# Patient Record
Sex: Female | Born: 1965
Health system: Southern US, Community
[De-identification: ages and names within clinical notes are randomized; demographics above are authoritative.]

## PROBLEM LIST (undated history)

## (undated) DIAGNOSIS — F419 Anxiety disorder, unspecified: Secondary | ICD-10-CM

## (undated) DIAGNOSIS — F319 Bipolar disorder, unspecified: Secondary | ICD-10-CM

## (undated) DIAGNOSIS — I10 Essential (primary) hypertension: Secondary | ICD-10-CM

## (undated) DIAGNOSIS — F32A Depression, unspecified: Secondary | ICD-10-CM

## (undated) DIAGNOSIS — F329 Major depressive disorder, single episode, unspecified: Secondary | ICD-10-CM

## (undated) DIAGNOSIS — K219 Gastro-esophageal reflux disease without esophagitis: Secondary | ICD-10-CM

## (undated) HISTORY — PX: DILATION AND CURETTAGE OF UTERUS: SHX78

---

## 2006-07-12 ENCOUNTER — Emergency Department (HOSPITAL_COMMUNITY): Admission: EM | Admit: 2006-07-12 | Discharge: 2006-07-12 | Payer: Self-pay | Admitting: Emergency Medicine

## 2006-10-17 ENCOUNTER — Emergency Department (HOSPITAL_COMMUNITY): Admission: EM | Admit: 2006-10-17 | Discharge: 2006-10-18 | Payer: Self-pay | Admitting: Emergency Medicine

## 2009-04-21 ENCOUNTER — Emergency Department (HOSPITAL_COMMUNITY): Admission: EM | Admit: 2009-04-21 | Discharge: 2009-04-21 | Payer: Self-pay | Admitting: Emergency Medicine

## 2010-09-29 LAB — URINALYSIS, ROUTINE W REFLEX MICROSCOPIC
Glucose, UA: NEGATIVE mg/dL
Nitrite: NEGATIVE
Protein, ur: NEGATIVE mg/dL
Specific Gravity, Urine: 1.012 (ref 1.005–1.030)
Urobilinogen, UA: 0.2 mg/dL (ref 0.0–1.0)

## 2010-09-29 LAB — URINE MICROSCOPIC-ADD ON

## 2010-09-29 LAB — POCT PREGNANCY, URINE: Preg Test, Ur: NEGATIVE

## 2010-10-18 ENCOUNTER — Emergency Department (HOSPITAL_COMMUNITY)
Admission: EM | Admit: 2010-10-18 | Discharge: 2010-10-19 | Disposition: A | Discharge status: Home / Self Care | Payer: Self-pay | Attending: Emergency Medicine | Admitting: Emergency Medicine

## 2010-10-18 DIAGNOSIS — F411 Generalized anxiety disorder: Secondary | ICD-10-CM | POA: Insufficient documentation

## 2010-10-18 DIAGNOSIS — R112 Nausea with vomiting, unspecified: Secondary | ICD-10-CM | POA: Insufficient documentation

## 2010-10-18 DIAGNOSIS — H53149 Visual discomfort, unspecified: Secondary | ICD-10-CM | POA: Insufficient documentation

## 2010-10-18 DIAGNOSIS — G43909 Migraine, unspecified, not intractable, without status migrainosus: Secondary | ICD-10-CM | POA: Insufficient documentation

## 2010-10-18 DIAGNOSIS — Z79899 Other long term (current) drug therapy: Secondary | ICD-10-CM | POA: Insufficient documentation

## 2010-12-05 IMAGING — CT CT HEAD W/O CM
1 of 2 series · 13 of 30 positions shown, 17 images · non-contrast
Comparison: None

CLINICAL DATA: Headaches since yesterday, nausea, vomiting,
photosensitivity, history of migraines

CT HEAD WITHOUT CONTRAST
TECHNIQUE: Contiguous axial images were obtained from the base of
the skull through the vertex without contrast.

[Series 2: brain · axial · 0.47mm/px · z∈[+154,+283]mm · 13 of 28 slices shown, 17 images]
[im 2/28  brain]
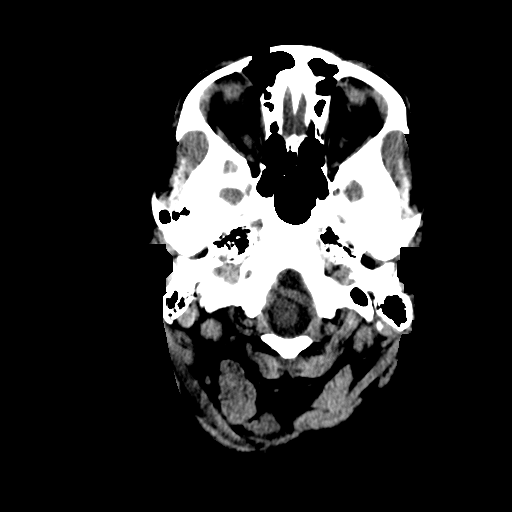
[im 2/28  bone]
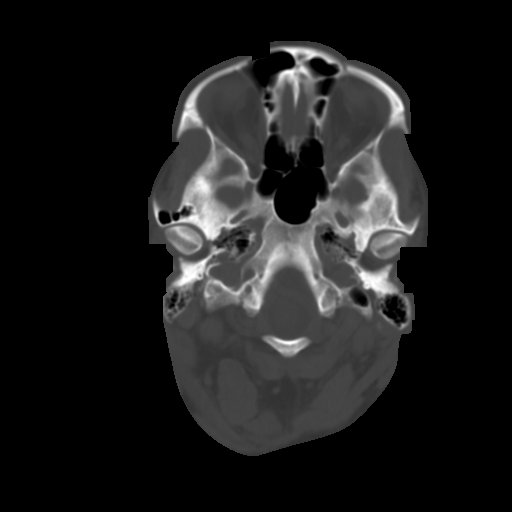
[im 4/28  brain]
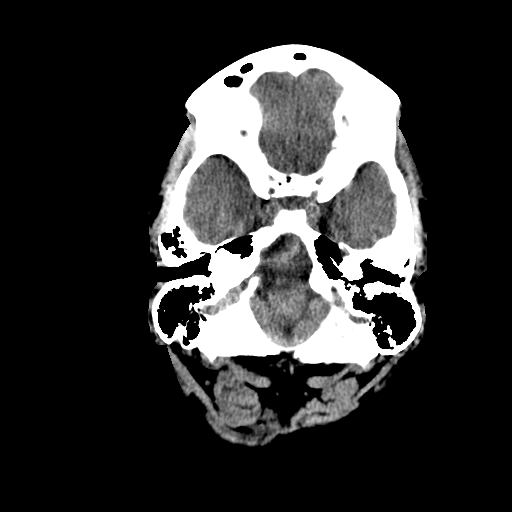
[im 6/28  brain]
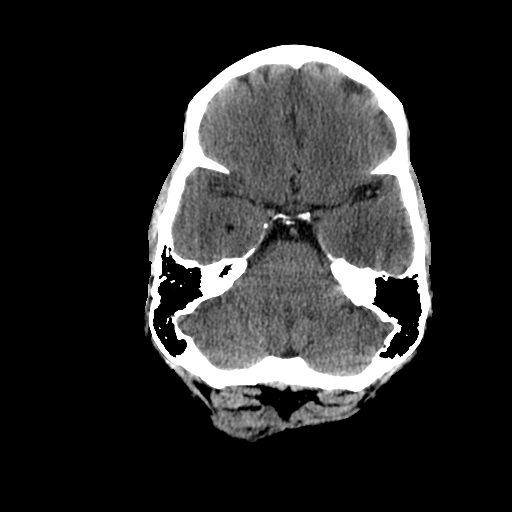
[im 8/28  brain]
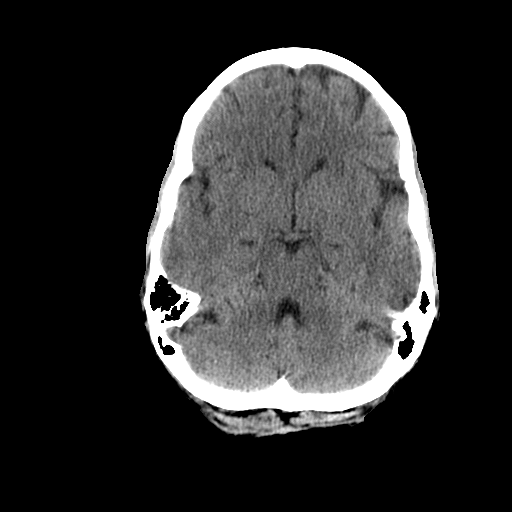
[im 10/28  brain]
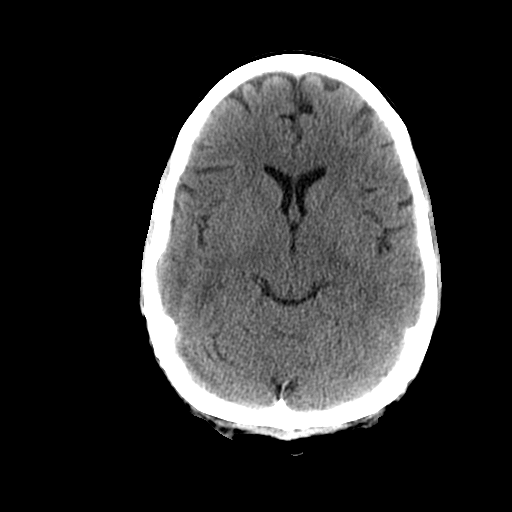
[im 10/28  bone]
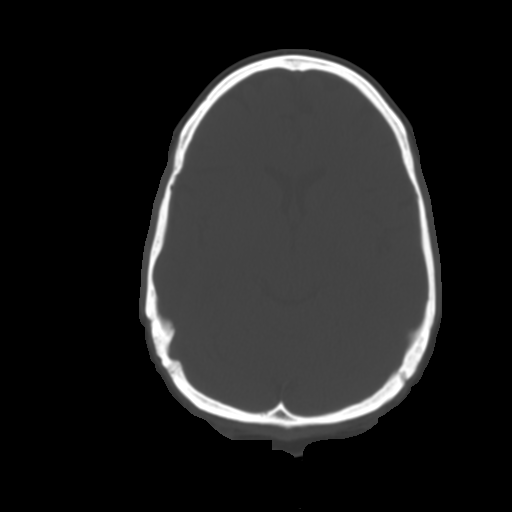
[im 12/28  brain]
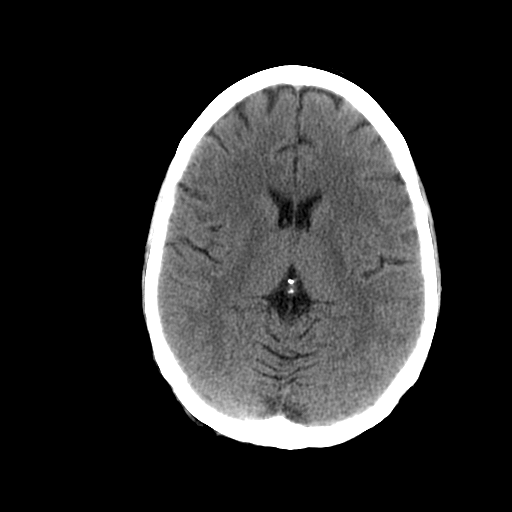
[im 14/28  brain]
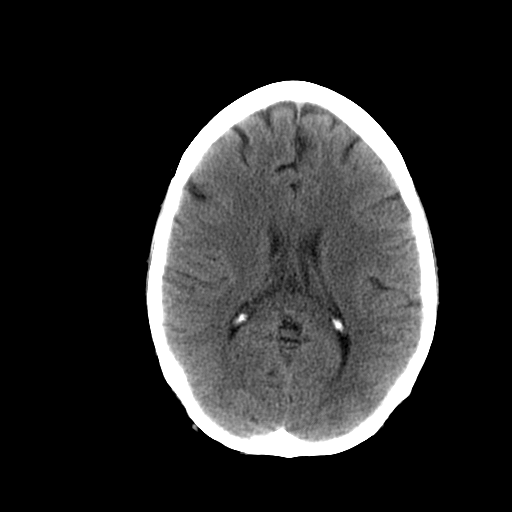
[im 16/28  brain]
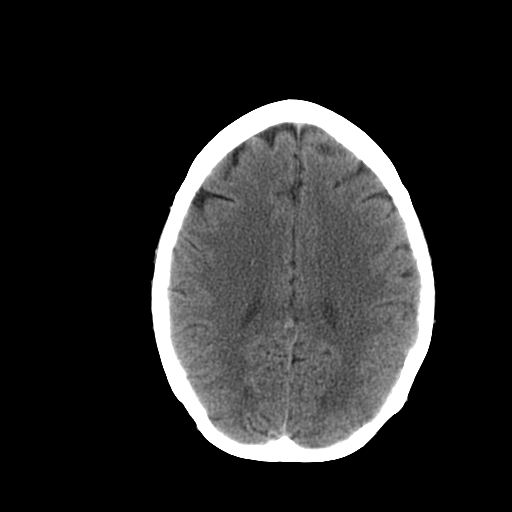
[im 18/28  brain]
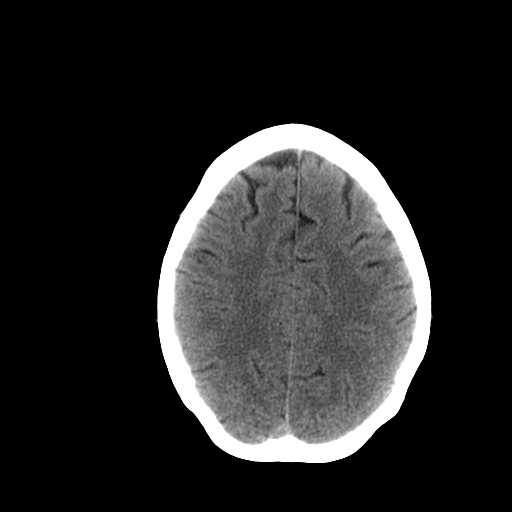
[im 18/28  bone]
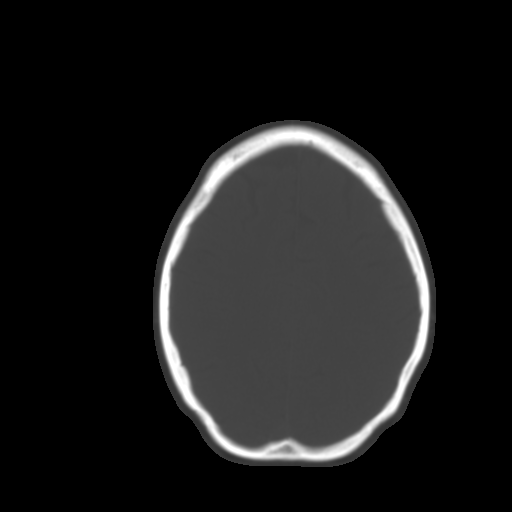
[im 20/28  brain]
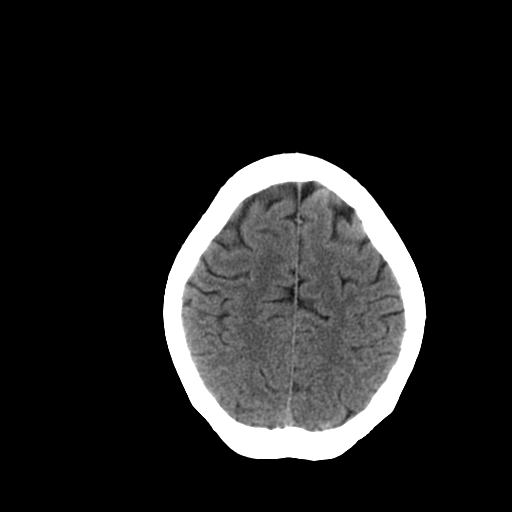
[im 22/28  brain]
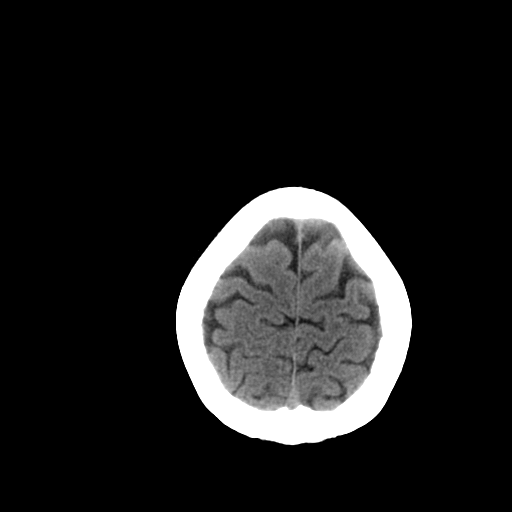
[im 24/28  brain]
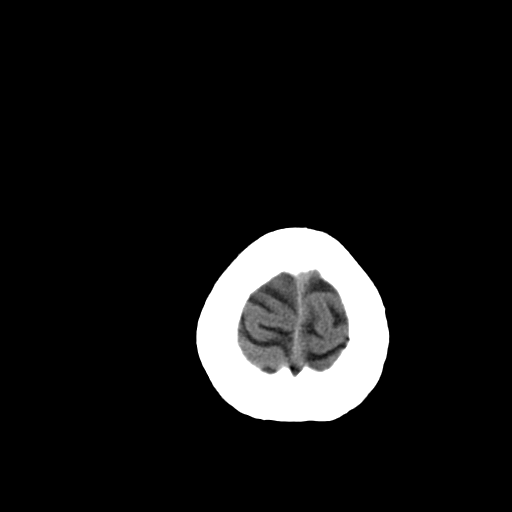
[im 26/28  brain]
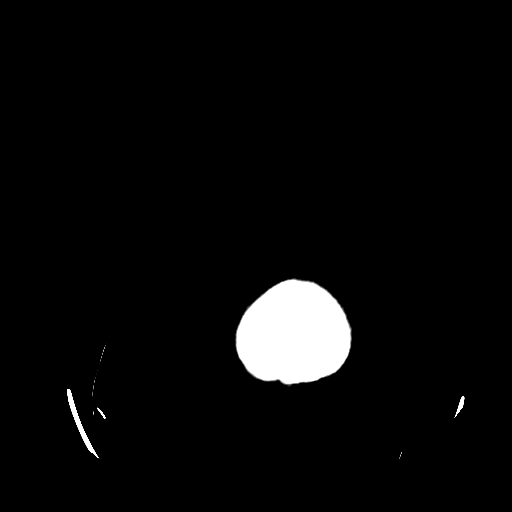
[im 26/28  bone]
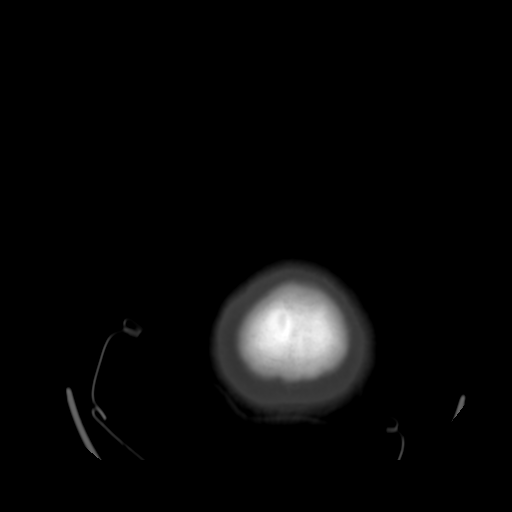

[13 of 30 positions shown; findings below may reference images not displayed]

FINDINGS: The ventricular system is normal in size and
configuration, and the septum is in a normal midline position.  The
fourth ventricle and basilar cisterns appear normal.  No
hemorrhage, mass lesion, or acute infarction is seen.  On bone
window images no bony abnormality is noted.  The paranasal sinuses
that are visualized are clear.
IMPRESSION: No acute intracranial abnormality.

## 2010-12-26 ENCOUNTER — Emergency Department: Payer: Self-pay | Admitting: Unknown Physician Specialty

## 2011-01-03 ENCOUNTER — Emergency Department: Payer: Self-pay | Admitting: Internal Medicine

## 2011-02-07 ENCOUNTER — Ambulatory Visit: Payer: Self-pay | Admitting: Gastroenterology

## 2011-07-13 ENCOUNTER — Emergency Department (HOSPITAL_COMMUNITY): Payer: Self-pay

## 2011-07-13 ENCOUNTER — Emergency Department (HOSPITAL_COMMUNITY)
Admission: EM | Admit: 2011-07-13 | Discharge: 2011-07-13 | Disposition: A | Discharge status: Home / Self Care | Payer: Self-pay | Attending: Emergency Medicine | Admitting: Emergency Medicine

## 2011-07-13 ENCOUNTER — Encounter (HOSPITAL_COMMUNITY): Payer: Self-pay | Admitting: Emergency Medicine

## 2011-07-13 DIAGNOSIS — F3130 Bipolar disorder, current episode depressed, mild or moderate severity, unspecified: Secondary | ICD-10-CM | POA: Insufficient documentation

## 2011-07-13 DIAGNOSIS — K921 Melena: Secondary | ICD-10-CM | POA: Insufficient documentation

## 2011-07-13 DIAGNOSIS — I10 Essential (primary) hypertension: Secondary | ICD-10-CM | POA: Insufficient documentation

## 2011-07-13 DIAGNOSIS — R1032 Left lower quadrant pain: Secondary | ICD-10-CM | POA: Insufficient documentation

## 2011-07-13 DIAGNOSIS — N390 Urinary tract infection, site not specified: Secondary | ICD-10-CM | POA: Insufficient documentation

## 2011-07-13 DIAGNOSIS — R10819 Abdominal tenderness, unspecified site: Secondary | ICD-10-CM | POA: Insufficient documentation

## 2011-07-13 HISTORY — DX: Essential (primary) hypertension: I10

## 2011-07-13 HISTORY — DX: Depression, unspecified: F32.A

## 2011-07-13 HISTORY — DX: Bipolar disorder, unspecified: F31.9

## 2011-07-13 HISTORY — DX: Major depressive disorder, single episode, unspecified: F32.9

## 2011-07-13 LAB — DIFFERENTIAL
Basophils Relative: 1 % (ref 0–1)
Eosinophils Relative: 2 % (ref 0–5)
Lymphocytes Relative: 35 % (ref 12–46)
Lymphs Abs: 3.1 10*3/uL (ref 0.7–4.0)
Neutrophils Relative %: 54 % (ref 43–77)

## 2011-07-13 LAB — CBC
HCT: 32.5 % — ABNORMAL LOW (ref 36.0–46.0)
MCH: 29.8 pg (ref 26.0–34.0)
MCV: 86.4 fL (ref 78.0–100.0)
RBC: 3.76 MIL/uL — ABNORMAL LOW (ref 3.87–5.11)
RDW: 12.5 % (ref 11.5–15.5)
WBC: 8.9 10*3/uL (ref 4.0–10.5)

## 2011-07-13 LAB — COMPREHENSIVE METABOLIC PANEL
ALT: 13 U/L (ref 0–35)
AST: 14 U/L (ref 0–37)
BUN: 12 mg/dL (ref 6–23)
Creatinine, Ser: 0.78 mg/dL (ref 0.50–1.10)
GFR calc non Af Amer: >90 mL/min (ref >90)
Total Protein: 6.6 g/dL (ref 6.0–8.3)

## 2011-07-13 LAB — URINALYSIS, ROUTINE W REFLEX MICROSCOPIC
Glucose, UA: NEGATIVE mg/dL
Ketones, ur: NEGATIVE mg/dL
Protein, ur: NEGATIVE mg/dL
Urobilinogen, UA: 0.2 mg/dL (ref 0.0–1.0)
pH: 6 (ref 5.0–8.0)

## 2011-07-13 LAB — URINE MICROSCOPIC-ADD ON

## 2011-07-13 MED ORDER — IOHEXOL 300 MG/ML  SOLN: 20.0000 mL | INTRAMUSCULAR | Status: AC

## 2011-07-13 MED ORDER — CIPROFLOXACIN HCL 500 MG PO TABS: 500.0000 mg | ORAL_TABLET | Freq: Two times a day (BID) | ORAL | Status: DC

## 2011-07-13 MED ORDER — SODIUM CHLORIDE 0.9 % IV SOLN
Freq: Once | INTRAVENOUS | Status: AC
  Administered 2011-07-13: 1000 mL via INTRAVENOUS

## 2011-07-13 MED ORDER — CIPROFLOXACIN HCL 500 MG PO TABS: 500.0000 mg | ORAL_TABLET | Freq: Two times a day (BID) | ORAL | Status: AC

## 2011-07-13 MED ORDER — MORPHINE SULFATE 4 MG/ML IJ SOLN
4.0000 mg | Freq: Once | INTRAMUSCULAR | Status: AC
  Administered 2011-07-13: 4 mg via INTRAVENOUS
  Filled 2011-07-13: qty 1

## 2011-07-13 MED ORDER — IOHEXOL 300 MG/ML  SOLN
80.0000 mL | Freq: Once | INTRAMUSCULAR | Status: AC | PRN
  Administered 2011-07-13: 80 mL via INTRAVENOUS

## 2011-07-13 MED ORDER — HYDROCODONE-ACETAMINOPHEN 5-500 MG PO TABS: 1.0000 | ORAL_TABLET | Freq: Four times a day (QID) | ORAL | Status: AC | PRN

## 2011-07-13 MED ORDER — ONDANSETRON HCL 4 MG/2ML IJ SOLN
4.0000 mg | Freq: Once | INTRAMUSCULAR | Status: AC
  Administered 2011-07-13: 4 mg via INTRAVENOUS
  Filled 2011-07-13: qty 2

## 2011-07-13 NOTE — ED Notes (Signed)
Pt has finished drinking CT contrast (water).  CT notified.

## 2011-07-13 NOTE — ED Notes (Signed)
PT. REPORTS MIGRAINE HEADACHE ONSET LAST NIGHT UNRELIEVED BY PRESCRIPTION MEDICATION , ALSO REPORTS LLQ PAIN FOR 2 MONTHS AND PRODUCTIVE COUGH AND NASAL CONGESTION WITH BLOODY STOOLS ONSET LAST WEEK .

## 2011-07-13 NOTE — ED Provider Notes (Signed)
History     CSN: [id number]  Arrival date & time 07/13/11  4098   First MD Initiated Contact with Patient 07/13/11 [phone]      Chief Complaint  Patient presents with  . Migraine    (Consider location/radiation/quality/duration/timing/severity/associated sxs/prior treatment) HPI Comments: States for two months has been having pain in the llq.  Becoming worse.  She also reports having several episodes of blood-streaked stool.  She denies fevers or chills.  Has been treated for utis in the past few months.    Patient is a 46 y.o. female presenting with migraine. The history is provided by the patient.  Migraine This is a new problem. Episode onset: 2 months ago. The problem occurs constantly. The problem has been gradually worsening. Associated symptoms include abdominal pain. The symptoms are aggravated by nothing. The symptoms are relieved by nothing. She has tried nothing for the symptoms.    Past Medical History  Diagnosis Date  . Migraine   . Hypertension   . Depression   . Bipolar 1 disorder     History reviewed. No pertinent past surgical history.  No family history on file.  History  Substance Use Topics  . Smoking status: Never Smoker   . Smokeless tobacco: Not on file  . Alcohol Use: No    OB History    Grav Para Term Preterm Abortions TAB SAB Ect Mult Living                  Review of Systems  Gastrointestinal: Positive for abdominal pain.  All other systems reviewed and are negative.    Allergies  Review of patient's allergies indicates no known allergies.  Home Medications  No current outpatient prescriptions on file.  BP 137/88  Pulse 85  Temp(Src) 98.7 F (37.1 C) (Oral)  Resp 14  SpO2 97%  Physical Exam  Nursing note and vitals reviewed. Constitutional: She appears well-developed and well-nourished.  HENT:  Head: Normocephalic and atraumatic.  Neck: Normal range of motion.  Abdominal: Soft. Bowel sounds are normal. She exhibits no  distension. There is tenderness.  Musculoskeletal: Normal range of motion. She exhibits no edema.  Neurological: She is alert.  Skin: Skin is warm and dry.    ED Course  Procedures (including critical care time)   Labs Reviewed  CBC  DIFFERENTIAL  COMPREHENSIVE METABOLIC PANEL  LIPASE, BLOOD  URINALYSIS, ROUTINE W REFLEX MICROSCOPIC   No results found.   No diagnosis found.    MDM  Labs and ct are okay with the exception of uti.  Will treat with pain meds, antibiotics.  To return as needed if worsens.        Geoffery Lyons, MD 07/13/11 [id number]

## 2011-07-13 NOTE — ED Notes (Signed)
Pt drinking water contrast at this time.  

## 2011-07-13 NOTE — ED Notes (Signed)
Dr. Judd Lien at pt bedside.

## 2011-08-10 ENCOUNTER — Inpatient Hospital Stay (HOSPITAL_COMMUNITY): Payer: Self-pay

## 2011-08-10 ENCOUNTER — Inpatient Hospital Stay (HOSPITAL_COMMUNITY)
Admission: AD | Admit: 2011-08-10 | Discharge: 2011-08-10 | Disposition: A | Discharge status: Home / Self Care | Payer: Self-pay | Source: Ambulatory Visit | Attending: Obstetrics & Gynecology | Admitting: Obstetrics & Gynecology

## 2011-08-10 ENCOUNTER — Encounter (HOSPITAL_COMMUNITY): Payer: Self-pay | Admitting: *Deleted

## 2011-08-10 DIAGNOSIS — N390 Urinary tract infection, site not specified: Secondary | ICD-10-CM | POA: Insufficient documentation

## 2011-08-10 DIAGNOSIS — F319 Bipolar disorder, unspecified: Secondary | ICD-10-CM | POA: Insufficient documentation

## 2011-08-10 DIAGNOSIS — O99340 Other mental disorders complicating pregnancy, unspecified trimester: Secondary | ICD-10-CM | POA: Insufficient documentation

## 2011-08-10 DIAGNOSIS — N898 Other specified noninflammatory disorders of vagina: Secondary | ICD-10-CM | POA: Insufficient documentation

## 2011-08-10 DIAGNOSIS — O2390 Unspecified genitourinary tract infection in pregnancy, unspecified trimester: Secondary | ICD-10-CM | POA: Insufficient documentation

## 2011-08-10 DIAGNOSIS — R109 Unspecified abdominal pain: Secondary | ICD-10-CM

## 2011-08-10 DIAGNOSIS — O99891 Other specified diseases and conditions complicating pregnancy: Secondary | ICD-10-CM | POA: Insufficient documentation

## 2011-08-10 DIAGNOSIS — I10 Essential (primary) hypertension: Secondary | ICD-10-CM | POA: Insufficient documentation

## 2011-08-10 DIAGNOSIS — R10819 Abdominal tenderness, unspecified site: Secondary | ICD-10-CM | POA: Insufficient documentation

## 2011-08-10 DIAGNOSIS — R1032 Left lower quadrant pain: Secondary | ICD-10-CM | POA: Insufficient documentation

## 2011-08-10 DIAGNOSIS — G43909 Migraine, unspecified, not intractable, without status migrainosus: Secondary | ICD-10-CM | POA: Insufficient documentation

## 2011-08-10 LAB — URINALYSIS, ROUTINE W REFLEX MICROSCOPIC
Bilirubin Urine: NEGATIVE
Hgb urine dipstick: NEGATIVE
Protein, ur: NEGATIVE mg/dL
Specific Gravity, Urine: 1.02 (ref 1.005–1.030)
pH: 6.5 (ref 5.0–8.0)

## 2011-08-10 LAB — WET PREP, GENITAL
Clue Cells Wet Prep HPF POC: NONE SEEN
Trich, Wet Prep: NONE SEEN
Yeast Wet Prep HPF POC: NONE SEEN

## 2011-08-10 LAB — CBC
Hemoglobin: 11.8 g/dL — ABNORMAL LOW (ref 12.0–15.0)
MCH: 29.6 pg (ref 26.0–34.0)
MCHC: 33.9 g/dL (ref 30.0–36.0)
MCV: 87.2 fL (ref 78.0–100.0)
RBC: 3.99 MIL/uL (ref 3.87–5.11)

## 2011-08-10 LAB — URINE MICROSCOPIC-ADD ON

## 2011-08-10 MED ORDER — KETOROLAC TROMETHAMINE 60 MG/2ML IM SOLN
60.0000 mg | Freq: Once | INTRAMUSCULAR | Status: AC
  Administered 2011-08-10: 60 mg via INTRAMUSCULAR
  Filled 2011-08-10: qty 2

## 2011-08-10 MED ORDER — CEPHALEXIN 500 MG PO CAPS: 500.0000 mg | ORAL_CAPSULE | Freq: Four times a day (QID) | ORAL | Status: AC

## 2011-08-10 MED ORDER — NAPROXEN SODIUM 550 MG PO TABS: 550.0000 mg | ORAL_TABLET | Freq: Two times a day (BID) | ORAL | Status: DC

## 2011-08-10 NOTE — Progress Notes (Signed)
Pain states she was treated for UTI" a while ago"

## 2011-08-10 NOTE — Discharge Instructions (Signed)
Urinary Tract Infection Infections of the urinary tract can start in several places. A bladder infection (cystitis), a kidney infection (pyelonephritis), and a prostate infection (prostatitis) are different types of urinary tract infections (UTIs). They usually get better if treated with medicines (antibiotics) that kill germs. Take all the medicine until it is gone. You or your child may feel better in a few days, but TAKE ALL MEDICINE or the infection may not respond and may become more difficult to treat. HOME CARE INSTRUCTIONS   Drink enough water and fluids to keep the urine clear or pale yellow. Cranberry juice is especially recommended, in addition to large amounts of water.   Avoid caffeine, tea, and carbonated beverages. They tend to irritate the bladder.   Alcohol may irritate the prostate.   Only take over-the-counter or prescription medicines for pain, discomfort, or fever as directed by your caregiver.  To prevent further infections:  Empty the bladder often. Avoid holding urine for long periods of time.   After a bowel movement, women should cleanse from front to back. Use each tissue only once.   Empty the bladder before and after sexual intercourse.  FINDING OUT THE RESULTS OF YOUR TEST Not all test results are available during your visit. If your or your child's test results are not back during the visit, make an appointment with your caregiver to find out the results. Do not assume everything is normal if you have not heard from your caregiver or the medical facility. It is important for you to follow up on all test results. SEEK MEDICAL CARE IF:   There is back pain.   Your baby is older than 3 months with a rectal temperature of 100.5 F (38.1 C) or higher for more than 1 day.   Your or your child's problems (symptoms) are no better in 3 days. Return sooner if you or your child is getting worse.  SEEK IMMEDIATE MEDICAL CARE IF:   There is severe back pain or lower  abdominal pain.   You or your child develops chills.   You have a fever.   Your baby is older than 3 months with a rectal temperature of 102 F (38.9 C) or higher.   Your baby is 72 months old or younger with a rectal temperature of 100.4 F (38 C) or higher.   There is nausea or vomiting.   There is continued burning or discomfort with urination.  MAKE SURE YOU:   Understand these instructions.   Will watch your condition.   Will get help right away if you are not doing well or get worse.  Document Released: 03/22/2005 Document Revised: 02/22/2011 Document Reviewed: 10/25/2006 [facility] Patient Information 2012 [place], [place].Abdominal Pain, Women Abdominal (stomach, pelvic, or belly) pain can be caused by many things. It is important to tell your doctor:  The location of the pain.   Does it come and go or is it present all the time?   Are there things that start the pain (eating certain foods, exercise)?   Are there other symptoms associated with the pain (fever, nausea, vomiting, diarrhea)?  All of this is helpful to know when trying to find the cause of the pain. CAUSES   Stomach: virus or bacteria infection, or ulcer.   Intestine: appendicitis (inflamed appendix), regional ileitis (Crohn's disease), ulcerative colitis (inflamed colon), irritable bowel syndrome, diverticulitis (inflamed diverticulum of the colon), or cancer of the stomach or intestine.   Gallbladder disease or stones in the gallbladder.  Kidney disease, kidney stones, or infection.   Pancreas infection or cancer.   Fibromyalgia (pain disorder).   Diseases of the female organs:   Uterus: fibroid (non-cancerous) tumors or infection.   Fallopian tubes: infection or tubal pregnancy.   Ovary: cysts or tumors.   Pelvic adhesions (scar tissue).   Endometriosis (uterus lining tissue growing in the pelvis and on the pelvic organs).   Pelvic congestion syndrome (female organs filling up with  blood just before the menstrual period).   Pain with the menstrual period.   Pain with ovulation (producing an egg).   Pain with an IUD (intrauterine device, birth control) in the uterus.   Cancer of the female organs.   Functional pain (pain not caused by a disease, may improve without treatment).   Psychological pain.   Depression.  DIAGNOSIS  Your doctor will decide the seriousness of your pain by doing an examination.  Blood tests.   X-rays.   Ultrasound.   CT scan (computed tomography, special type of X-ray).   MRI (magnetic resonance imaging).   Cultures, for infection.   Barium enema (dye inserted in the large intestine, to better view it with X-rays).   Colonoscopy (looking in intestine with a lighted tube).   Laparoscopy (minor surgery, looking in abdomen with a lighted tube).   Major abdominal exploratory surgery (looking in abdomen with a large incision).  TREATMENT  The treatment will depend on the cause of the pain.   Many cases can be observed and treated at home.   Over-the-counter medicines recommended by your caregiver.   Prescription medicine.   Antibiotics, for infection.   Birth control pills, for painful periods or for ovulation pain.   Hormone treatment, for endometriosis.   Nerve blocking injections.   Physical therapy.   Antidepressants.   Counseling with a psychologist or psychiatrist.   Minor or major surgery.  HOME CARE INSTRUCTIONS   Do not take laxatives, unless directed by your caregiver.   Take over-the-counter pain medicine only if ordered by your caregiver. Do not take aspirin because it can cause an upset stomach or bleeding.   Try a clear liquid diet (broth or water) as ordered by your caregiver. Slowly move to a bland diet, as tolerated, if the pain is related to the stomach or intestine.   Have a thermometer and take your temperature several times a day, and record it.   Bed rest and sleep, if it helps the  pain.   Avoid sexual intercourse, if it causes pain.   Avoid stressful situations.   Keep your follow-up appointments and tests, as your caregiver orders.   If the pain does not go away with medicine or surgery, you may try:   Acupuncture.   Relaxation exercises (yoga, meditation).   Group therapy.   Counseling.  SEEK MEDICAL CARE IF:   You notice certain foods cause stomach pain.   Your home care treatment is not helping your pain.   You need stronger pain medicine.   You want your IUD removed.   You feel faint or lightheaded.   You develop nausea and vomiting.   You develop a rash.   You are having side effects or an allergy to your medicine.  SEEK IMMEDIATE MEDICAL CARE IF:   Your pain does not go away or gets worse.   You have a fever.   Your pain is felt only in portions of the abdomen. The right side could possibly be appendicitis. The left lower portion of  the abdomen could be colitis or diverticulitis.   You are passing blood in your stools (bright red or black tarry stools, with or without vomiting).   You have blood in your urine.   You develop chills, with or without a fever.   You pass out.  MAKE SURE YOU:   Understand these instructions.   Will watch your condition.   Will get help right away if you are not doing well or get worse.  Document Released: 04/09/2007 Document Revised: 02/22/2011 Document Reviewed: 04/29/2009 [facility] Patient Information 2012 [place], [place].

## 2011-08-10 NOTE — ED Provider Notes (Signed)
History   Pt presents today c/o chronic lower abd pain. She states she has been having LLQ pain for the past several months and is worse when she goes to bed at night. She states the pain has now encompassed both lower quadrants. She was seen a couple of months ago and [facility] and had a negative CT scan and was told she had a UTI. She states she took all the medication and the pain did improve for a short time. She denies dysuria, fever, vag dc, bleeding, or any other sx at this time.  Chief Complaint  Patient presents with  . Abdominal Pain   HPI  OB History    Grav Para Term Preterm Abortions TAB SAB Ect Mult Living   3 2 2  0 1 0 1 0 0 2      Past Medical History  Diagnosis Date  . Migraine   . Hypertension   . Depression   . Bipolar 1 disorder     Past Surgical History  Procedure Date  . Dilation and curettage of uterus     Family History  Problem Relation Age of Onset  . Hypertension Mother   . Stroke Father     History  Substance Use Topics  . Smoking status: Never Smoker   . Smokeless tobacco: Not on file  . Alcohol Use: No    Allergies: No Known Allergies  Prescriptions prior to admission  Medication Sig Dispense Refill  . clonazePAM (KLONOPIN) 2 MG tablet Take 2 mg by mouth at bedtime as needed. For sleep and anxiety prevention Scheduled dose      . DULoxetine (CYMBALTA) 30 MG capsule Take 90 mg by mouth daily.      Marland Kitchen ibuprofen (ADVIL,MOTRIN) 200 MG tablet Take 200-800 mg by mouth every 6 (six) hours as needed. Migraine headaches      . naratriptan (AMERGE) 2.5 MG tablet Take 2.5 mg by mouth as needed. Take one (1) tablet at onset of headache; if returns or does not resolve, may repeat after 4 hours; do not exceed five (5) mg in 24 hours.      . norgestimate-ethinyl estradiol (ORTHO-CYCLEN,SPRINTEC,PREVIFEM) 0.25-35 MG-MCG tablet Take 1 tablet by mouth every evening.      Marland Kitchen OVER THE COUNTER MEDICATION Take 1 tablet by mouth 2 (two) times daily. Green Coffee  Bean Extract Hold while in hospital      . propranolol (INDERAL) 20 MG tablet Take 20 mg by mouth daily.        Review of Systems  Constitutional: Negative for fever.  Eyes: Negative for blurred vision and double vision.  Respiratory: Negative for cough, hemoptysis, sputum production, shortness of breath and wheezing.   Cardiovascular: Negative for chest pain and palpitations.  Gastrointestinal: Positive for abdominal pain. Negative for nausea, vomiting, diarrhea and constipation.  Genitourinary: Negative for dysuria, urgency, frequency and hematuria.  Neurological: Negative for dizziness and headaches.  Psychiatric/Behavioral: Negative for depression and suicidal ideas.   Physical Exam   Blood pressure 156/91, pulse 105, temperature 97.9 F (36.6 C), temperature source Oral, resp. rate 18, last menstrual period 07/26/2011, SpO2 97.00%.  Physical Exam  Nursing note and vitals reviewed. Constitutional: She is oriented to person, place, and time. She appears well-developed and well-nourished. No distress.  HENT:  Head: Normocephalic and atraumatic.  Eyes: EOM are normal. Pupils are equal, round, and reactive to light.  GI: Soft. She exhibits no distension and no mass. There is tenderness (Mild tenderness to bilateral lower quadrants.).  There is no rebound and no guarding.  Genitourinary: No bleeding around the vagina. Vaginal discharge found.       Cervix Lg/closed. No obvious adnexal masses. Pt tender on exam.  Neurological: She is alert and oriented to person, place, and time.  Skin: Skin is warm and dry. She is not diaphoretic.  Psychiatric: She has a normal mood and affect. Her behavior is normal. Judgment and thought content normal.    MAU Course  Procedures  Wet prep and GC/Chlamydia cultures done.  Results for orders placed during the hospital encounter of 08/10/11 (from the past 24 hour(s))  URINALYSIS, ROUTINE W REFLEX MICROSCOPIC     Status: Abnormal   Collection Time    08/10/11  3:15 PM      Component Value Range   Color, Urine YELLOW  YELLOW    APPearance HAZY (*) CLEAR    Specific Gravity, Urine 1.020  1.005 - 1.030    pH 6.5  5.0 - 8.0    Glucose, UA NEGATIVE  NEGATIVE (mg/dL)   Hgb urine dipstick NEGATIVE  NEGATIVE    Bilirubin Urine NEGATIVE  NEGATIVE    Ketones, ur NEGATIVE  NEGATIVE (mg/dL)   Protein, ur NEGATIVE  NEGATIVE (mg/dL)   Urobilinogen, UA 0.2  0.0 - 1.0 (mg/dL)   Nitrite NEGATIVE  NEGATIVE    Leukocytes, UA SMALL (*) NEGATIVE   URINE MICROSCOPIC-ADD ON     Status: Abnormal   Collection Time   08/10/11  3:15 PM      Component Value Range   Squamous Epithelial / LPF FEW (*) RARE    WBC, UA 3-6  <3 (WBC/hpf)   RBC / HPF 0-2  <3 (RBC/hpf)   Bacteria, UA MANY (*) RARE    Urine-Other MUCOUS PRESENT    CBC     Status: Abnormal   Collection Time   08/10/11  3:40 PM      Component Value Range   WBC 11.4 (*) 4.0 - 10.5 (K/uL)   RBC 3.99  3.87 - 5.11 (MIL/uL)   Hemoglobin 11.8 (*) 12.0 - 15.0 (g/dL)   HCT 16.1 (*) 09.6 - 46.0 (%)   MCV 87.2  78.0 - 100.0 (fL)   MCH 29.6  26.0 - 34.0 (pg)   MCHC 33.9  30.0 - 36.0 (g/dL)   RDW 04.5  40.9 - 81.1 (%)   Platelets 361  150 - 400 (K/uL)  WET PREP, GENITAL     Status: Abnormal   Collection Time   08/10/11  3:40 PM      Component Value Range   Yeast Wet Prep HPF POC NONE SEEN  NONE SEEN    Trich, Wet Prep NONE SEEN  NONE SEEN    Clue Cells Wet Prep HPF POC NONE SEEN  NONE SEEN    WBC, Wet Prep HPF POC FEW (*) NONE SEEN   POCT PREGNANCY, URINE     Status: Normal   Collection Time   08/10/11  4:25 PM      Component Value Range   Preg Test, Ur NEGATIVE  NEGATIVE    US Transvaginal Non-ob  08/10/2011  *RADIOLOGY REPORT*  Clinical Data: Pain  TRANSABDOMINAL AND TRANSVAGINAL ULTRASOUND OF PELVIS Technique:  Both transabdominal and transvaginal ultrasound examinations of the pelvis were performed. Transabdominal technique was performed for global imaging of the pelvis including uterus,  ovaries, adnexal regions, and pelvic cul-de-sac.  Comparison: None.   It was necessary to proceed with endovaginal exam following the transabdominal exam to visualize  the endometrium and adnexa.  Findings:  The uterus is normal in size and echotexture, measuring 8.0 x 4.2 x 5.1 centimeters.  The endometrial stripe is within normal limits in width, measuring 3 mm.  Both ovaries have a normal size and appearance.  The right ovary measures 2.3 x 1.1 x 1.8 cm, and the left ovary measures 2.0 x 1.4 x 1.4 cm.  There are no adnexal masses or free pelvic fluid.  IMPRESSION: Normal pelvic ultrasound.  Original Report Authenticated By: Brandon Melnick, M.D.   US Pelvis Complete  08/10/2011  *RADIOLOGY REPORT*  Clinical Data: Pain  TRANSABDOMINAL AND TRANSVAGINAL ULTRASOUND OF PELVIS Technique:  Both transabdominal and transvaginal ultrasound examinations of the pelvis were performed. Transabdominal technique was performed for global imaging of the pelvis including uterus, ovaries, adnexal regions, and pelvic cul-de-sac.  Comparison: None.   It was necessary to proceed with endovaginal exam following the transabdominal exam to visualize the endometrium and adnexa.  Findings:  The uterus is normal in size and echotexture, measuring 8.0 x 4.2 x 5.1 centimeters.  The endometrial stripe is within normal limits in width, measuring 3 mm.  Both ovaries have a normal size and appearance.  The right ovary measures 2.3 x 1.1 x 1.8 cm, and the left ovary measures 2.0 x 1.4 x 1.4 cm.  There are no adnexal masses or free pelvic fluid.  IMPRESSION: Normal pelvic ultrasound.  Original Report Authenticated By: Brandon Melnick, M.D.   Urine sent for culture.  Assessment and Plan  Abd pain: discussed with pt at length. She will f/u with her PCP. No GYN etiology noted. Discussed diet, activity, risks, and precautions.  UTI: will tx with keflex. Await urine culture.  Clinton Gallant. Elman Dettman III, DrHSc, MPAS, PA-C  08/10/2011, 4:00 PM   Henrietta Hoover, PA 08/10/11 1651  Henrietta Hoover, [place] 08/10/11 1652

## 2011-08-11 LAB — URINE CULTURE
Colony Count: NO GROWTH
Culture: NO GROWTH

## 2012-01-03 ENCOUNTER — Emergency Department (HOSPITAL_COMMUNITY)
Admission: EM | Admit: 2012-01-03 | Discharge: 2012-01-03 | Disposition: A | Discharge status: Home Health | Payer: Self-pay | Attending: Emergency Medicine | Admitting: Emergency Medicine

## 2012-01-03 ENCOUNTER — Encounter (HOSPITAL_COMMUNITY): Payer: Self-pay | Admitting: *Deleted

## 2012-01-03 DIAGNOSIS — I10 Essential (primary) hypertension: Secondary | ICD-10-CM | POA: Insufficient documentation

## 2012-01-03 DIAGNOSIS — F341 Dysthymic disorder: Secondary | ICD-10-CM | POA: Insufficient documentation

## 2012-01-03 DIAGNOSIS — F319 Bipolar disorder, unspecified: Secondary | ICD-10-CM | POA: Insufficient documentation

## 2012-01-03 DIAGNOSIS — M549 Dorsalgia, unspecified: Secondary | ICD-10-CM

## 2012-01-03 DIAGNOSIS — R109 Unspecified abdominal pain: Secondary | ICD-10-CM | POA: Insufficient documentation

## 2012-01-03 DIAGNOSIS — N390 Urinary tract infection, site not specified: Secondary | ICD-10-CM | POA: Insufficient documentation

## 2012-01-03 HISTORY — DX: Anxiety disorder, unspecified: F41.9

## 2012-01-03 LAB — COMPREHENSIVE METABOLIC PANEL
ALT: 13 U/L (ref 0–35)
AST: 17 U/L (ref 0–37)
Alkaline Phosphatase: 80 U/L (ref 39–117)
Calcium: 9.5 mg/dL (ref 8.4–10.5)
Potassium: 3.6 mEq/L (ref 3.5–5.1)
Sodium: 137 mEq/L (ref 135–145)
Total Protein: 7.4 g/dL (ref 6.0–8.3)

## 2012-01-03 LAB — URINALYSIS, ROUTINE W REFLEX MICROSCOPIC
Bilirubin Urine: NEGATIVE
Glucose, UA: NEGATIVE mg/dL
Hgb urine dipstick: NEGATIVE
Specific Gravity, Urine: 1.018 (ref 1.005–1.030)
pH: 7 (ref 5.0–8.0)

## 2012-01-03 LAB — PREGNANCY, URINE: Preg Test, Ur: NEGATIVE

## 2012-01-03 LAB — URINE MICROSCOPIC-ADD ON

## 2012-01-03 LAB — CBC WITH DIFFERENTIAL/PLATELET
Basophils Absolute: 0 10*3/uL (ref 0.0–0.1)
Eosinophils Absolute: 0 10*3/uL (ref 0.0–0.7)
Eosinophils Relative: 0 % (ref 0–5)
Lymphocytes Relative: 11 % — ABNORMAL LOW (ref 12–46)
Lymphs Abs: 1.5 10*3/uL (ref 0.7–4.0)
MCV: 85.9 fL (ref 78.0–100.0)
Neutrophils Relative %: 84 % — ABNORMAL HIGH (ref 43–77)
Platelets: 365 10*3/uL (ref 150–400)
RBC: 4.26 MIL/uL (ref 3.87–5.11)
RDW: 12.5 % (ref 11.5–15.5)
WBC: 13.5 10*3/uL — ABNORMAL HIGH (ref 4.0–10.5)

## 2012-01-03 MED ORDER — ONDANSETRON HCL 4 MG/2ML IJ SOLN
4.0000 mg | Freq: Once | INTRAMUSCULAR | Status: AC
  Administered 2012-01-03: 4 mg via INTRAVENOUS
  Filled 2012-01-03: qty 2

## 2012-01-03 MED ORDER — FENTANYL CITRATE 0.05 MG/ML IJ SOLN
50.0000 ug | Freq: Once | INTRAMUSCULAR | Status: AC
  Administered 2012-01-03: 50 ug via INTRAVENOUS
  Filled 2012-01-03: qty 2

## 2012-01-03 MED ORDER — HYDROCODONE-ACETAMINOPHEN 5-325 MG PO TABS: 1.0000 | ORAL_TABLET | Freq: Four times a day (QID) | ORAL | Status: AC | PRN

## 2012-01-03 MED ORDER — SODIUM CHLORIDE 0.9 % IV BOLUS (SEPSIS)
1000.0000 mL | Freq: Once | INTRAVENOUS | Status: AC
  Administered 2012-01-03: 1000 mL via INTRAVENOUS

## 2012-01-03 MED ORDER — CIPROFLOXACIN HCL 500 MG PO TABS: 500.0000 mg | ORAL_TABLET | Freq: Two times a day (BID) | ORAL | Status: AC

## 2012-01-03 MED ORDER — KETOROLAC TROMETHAMINE 30 MG/ML IJ SOLN
30.0000 mg | Freq: Once | INTRAMUSCULAR | Status: AC
  Administered 2012-01-03: 30 mg via INTRAVENOUS
  Filled 2012-01-03: qty 1

## 2012-01-03 NOTE — ED Notes (Signed)
Pt has multiple complaints. Reports lower back pain since Sunday morning, pain is now radiating around to lower abd. Having a migraine which is causing n/v. No acute distress noted at triage.

## 2012-01-03 NOTE — ED Provider Notes (Signed)
History     CSN: [id number]  Arrival date & time 01/03/12  0825   First MD Initiated Contact with Patient 01/03/12 0851      9:45 AM HPI This reports approximately a week ago went to [place] and walked around in the mall. Reports waking up the next morning with severe lower back  pain. Reports pain is worse with sitting and laying flat. States pain is improved with standing. Denies saddle anesthesias, perineal numbness,incontinence, weakness, dysuria, hematuria, vaginal discharge. States also gradually began having lower abdominal pain in the suprapubic region and nausea and vomiting. Reports a history of migraines and  pain has aggravated typical migraine as well. Denies neck pain, fever, visual change, rash. Patient is a 46 y.o. female presenting with back pain. The history is provided by the patient.  Back Pain  This is a recurrent problem. The current episode started more than 1 week ago. The problem occurs constantly. The problem has been gradually worsening. The pain is associated with no known injury. The pain is present in the lumbar spine. The quality of the pain is described as aching. The pain does not radiate. The pain is moderate. The symptoms are aggravated by bending. Associated symptoms include headaches and abdominal pain. Pertinent negatives include no chest pain, no fever, no numbness, no bowel incontinence, no perianal numbness, no bladder incontinence, no dysuria, no pelvic pain, no leg pain, no paresthesias, no paresis, no tingling and no weakness. She has tried NSAIDs for the symptoms. The treatment provided no relief.    Past Medical History  Diagnosis Date  . Migraine   . Hypertension   . Depression   . Bipolar 1 disorder   . Anxiety   . Depression     Past Surgical History  Procedure Date  . Dilation and curettage of uterus     Family History  Problem Relation Age of Onset  . Hypertension Mother   . Stroke Father     History  Substance Use Topics  .  Smoking status: Never Smoker   . Smokeless tobacco: Not on file  . Alcohol Use: No    OB History    Grav Para Term Preterm Abortions TAB SAB Ect Mult Living   3 2 2  0 1 0 1 0 0 2      Review of Systems  Constitutional: Negative for fever and chills.  HENT: Negative for neck pain and neck stiffness.   Eyes: Negative for pain.  Cardiovascular: Negative for chest pain.  Gastrointestinal: Positive for nausea, vomiting and abdominal pain. Negative for bowel incontinence.  Genitourinary: Negative for bladder incontinence, dysuria, urgency, frequency, hematuria, flank pain and pelvic pain.  Musculoskeletal: Positive for back pain.       Denies saddle anesthesias, or perineal numbness, urinary or bowel incontinence  Neurological: Positive for headaches. Negative for dizziness, tingling, weakness, numbness and paresthesias.  All other systems reviewed and are negative.    Allergies  Review of patient's allergies indicates no known allergies.  Home Medications   Current Outpatient Rx  Name Route Sig Dispense Refill  . AMPHETAMINE-DEXTROAMPHETAMINE 20 MG PO TABS Oral Take 20 mg by mouth 2 (two) times daily.    Marland Kitchen CLONAZEPAM 2 MG PO TABS Oral Take 2 mg by mouth at bedtime as needed. For sleep and anxiety prevention Scheduled dose    . DULOXETINE HCL 30 MG PO CPEP Oral Take 90 mg by mouth daily.    Marland Kitchen NORGESTIMATE-ETH ESTRADIOL 0.25-35 MG-MCG PO TABS Oral Take 1  tablet by mouth every evening.    Marland Kitchen ZOLMITRIPTAN 2.5 MG PO TABS Oral Take 2.5 mg by mouth daily as needed. For headache      BP 137/84  Pulse 104  Temp 98.4 F (36.9 C) (Oral)  Resp 18  SpO2 97%  LMP 12/20/2011  Physical Exam  Vitals reviewed. Constitutional: She is oriented to person, place, and time. Vital signs are normal. She appears well-developed and well-nourished.  HENT:  Head: Normocephalic and atraumatic.  Eyes: Conjunctivae are normal. Pupils are equal, round, and reactive to light.  Neck: Normal range of  motion. Neck supple.  Cardiovascular: Normal rate, regular rhythm and normal heart sounds.  Exam reveals no friction rub.   No murmur heard. Pulmonary/Chest: Effort normal and breath sounds normal. She has no wheezes. She has no rhonchi. She has no rales. She exhibits no tenderness.  Abdominal: Soft. Bowel sounds are normal. She exhibits no distension, no ascites and no mass. There is no tenderness.  Musculoskeletal: Normal range of motion.       Lumbar back: She exhibits tenderness, pain and spasm. She exhibits normal range of motion, no swelling and normal pulse.       Back:       Patient has a positive straight-leg raise. Tenderness over lumbar spine and paraspinal region. Pain easily reproducible with sitting position. No saddle anesthesias or perineal numbness. Normal sensation distally. Full range of motion of hip and knee.  Neurological: She is alert and oriented to person, place, and time. Coordination normal.  Skin: Skin is warm and dry. No rash noted. No erythema. No pallor.    ED Course  Procedures   Results for orders placed during the hospital encounter of 01/03/12  CBC WITH DIFFERENTIAL      Component Value Range   WBC 13.5 (*) 4.0 - 10.5 K/uL   RBC 4.26  3.87 - 5.11 MIL/uL   Hemoglobin 12.8  12.0 - 15.0 g/dL   HCT 16.1  09.6 - 04.5 %   MCV 85.9  78.0 - 100.0 fL   MCH 30.0  26.0 - 34.0 pg   MCHC 35.0  30.0 - 36.0 g/dL   RDW 40.9  81.1 - 91.4 %   Platelets 365  150 - 400 K/uL   Neutrophils Relative 84 (*) 43 - 77 %   Neutro Abs 11.3 (*) 1.7 - 7.7 K/uL   Lymphocytes Relative 11 (*) 12 - 46 %   Lymphs Abs 1.5  0.7 - 4.0 K/uL   Monocytes Relative 4  3 - 12 %   Monocytes Absolute 0.6  0.1 - 1.0 K/uL   Eosinophils Relative 0  0 - 5 %   Eosinophils Absolute 0.0  0.0 - 0.7 K/uL   Basophils Relative 0  0 - 1 %   Basophils Absolute 0.0  0.0 - 0.1 K/uL  COMPREHENSIVE METABOLIC PANEL      Component Value Range   Sodium 137  135 - 145 mEq/L   Potassium 3.6  3.5 - 5.1 mEq/L     Chloride 97  96 - 112 mEq/L   CO2 27  19 - 32 mEq/L   Glucose, Bld 129 (*) 70 - 99 mg/dL   BUN 7  6 - 23 mg/dL   Creatinine, Ser 7.82  0.50 - 1.10 mg/dL   Calcium 9.5  8.4 - 95.6 mg/dL   Total Protein 7.4  6.0 - 8.3 g/dL   Albumin 3.6  3.5 - 5.2 g/dL   AST 17  0 - 37 U/L   ALT 13  0 - 35 U/L   Alkaline Phosphatase 80  39 - 117 U/L   Total Bilirubin 0.4  0.3 - 1.2 mg/dL   GFR calc non Af Amer 83 (*) >90 mL/min   GFR calc Af Amer >90  >90 mL/min  LIPASE, BLOOD      Component Value Range   Lipase 15  11 - 59 U/L  URINALYSIS, ROUTINE W REFLEX MICROSCOPIC      Component Value Range   Color, Urine YELLOW  YELLOW   APPearance CLOUDY (*) CLEAR   Specific Gravity, Urine 1.018  1.005 - 1.030   pH 7.0  5.0 - 8.0   Glucose, UA NEGATIVE  NEGATIVE mg/dL   Hgb urine dipstick NEGATIVE  NEGATIVE   Bilirubin Urine NEGATIVE  NEGATIVE   Ketones, ur 40 (*) NEGATIVE mg/dL   Protein, ur NEGATIVE  NEGATIVE mg/dL   Urobilinogen, UA 1.0  0.0 - 1.0 mg/dL   Nitrite NEGATIVE  NEGATIVE   Leukocytes, UA LARGE (*) NEGATIVE  PREGNANCY, URINE      Component Value Range   Preg Test, Ur NEGATIVE  NEGATIVE  URINE MICROSCOPIC-ADD ON      Component Value Range   Squamous Epithelial / LPF MANY (*) RARE   WBC, UA 21-50  <3 WBC/hpf   RBC / HPF 0-2  <3 RBC/hpf   Bacteria, UA FEW (*) RARE   Urine-Other MUCOUS PRESENT       MDM    11:58 AM Patient has UTI on urinalysis. However will treat patient with ciprofloxacin and Vicodin for back pain. You suspect patient is component of sciatica since pain is worse with laying down and better with standing. Patient reports feels like prior sciatica pain in the past. The suspicion for cord compression or meningitis. Advised followup with orthopedic physician since pain has been ongoing for several years. Patient voices understanding and is ready for discharge     Thomasene Lot, Cordelia Poche 01/03/12 1159

## 2012-01-04 LAB — URINE CULTURE

## 2012-01-04 NOTE — ED Provider Notes (Signed)
Medical screening examination/treatment/procedure(s) were performed by non-physician practitioner and as supervising physician I was immediately available for consultation/collaboration.  Thelonious Kauffmann T Dent Plantz, MD 01/04/12 0736 

## 2012-08-18 IMAGING — CR DG ABDOMEN 3V
1 series · 4 of 4 positions shown · non-contrast
Comparison: none

REASON FOR EXAM: epgastric pain, constipation
COMMENTS:   May transport without cardiac monitor

[Series 1: view not recorded · 0.17mm/px · 4 of 4 slices shown]
[im 1/4]
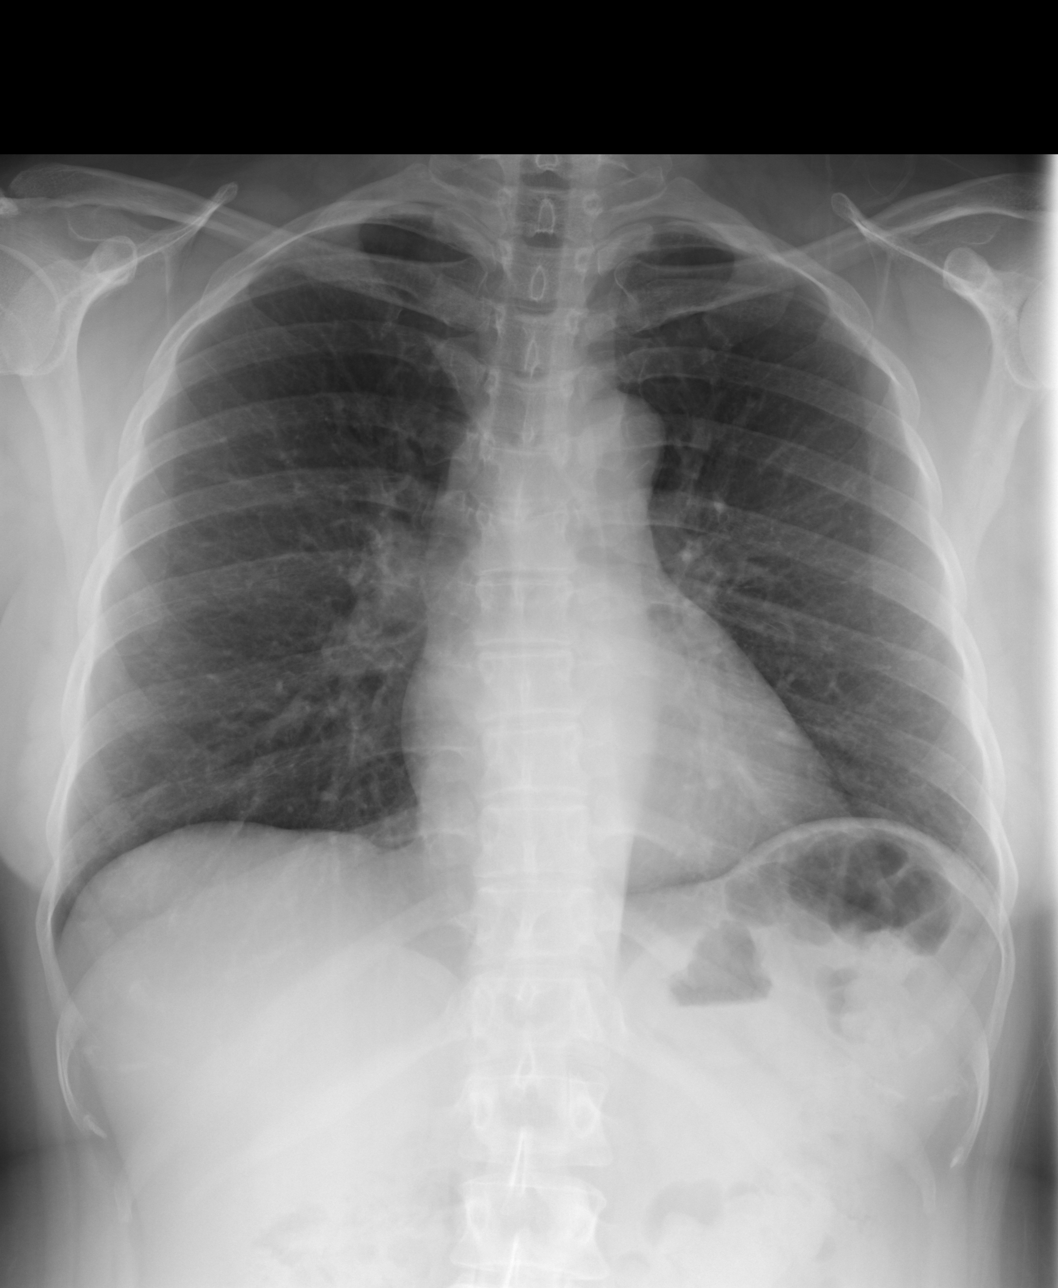
[im 2/4]
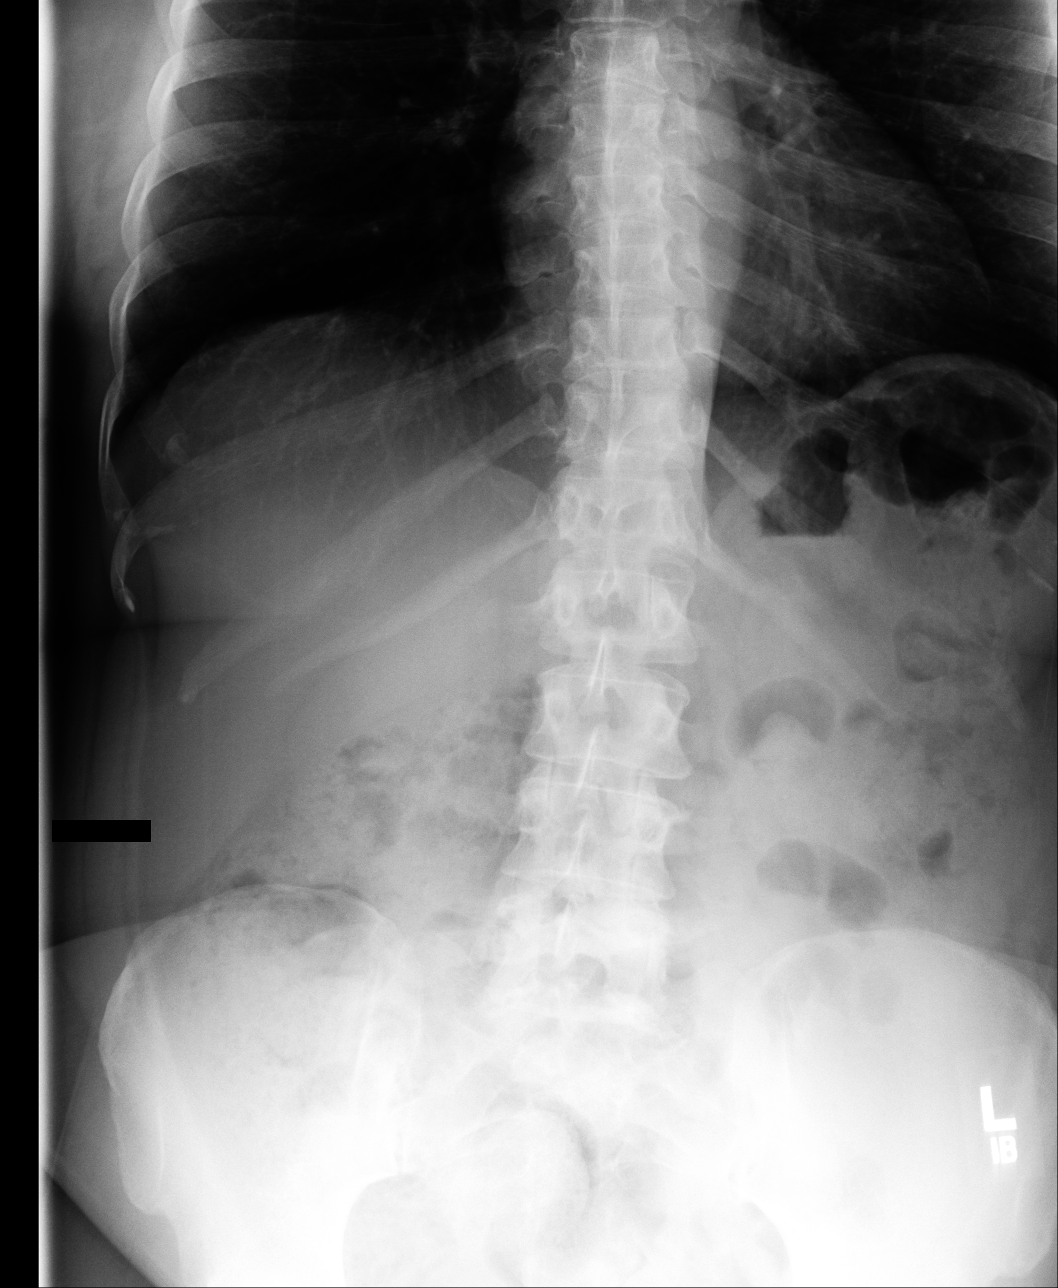
[im 3/4]
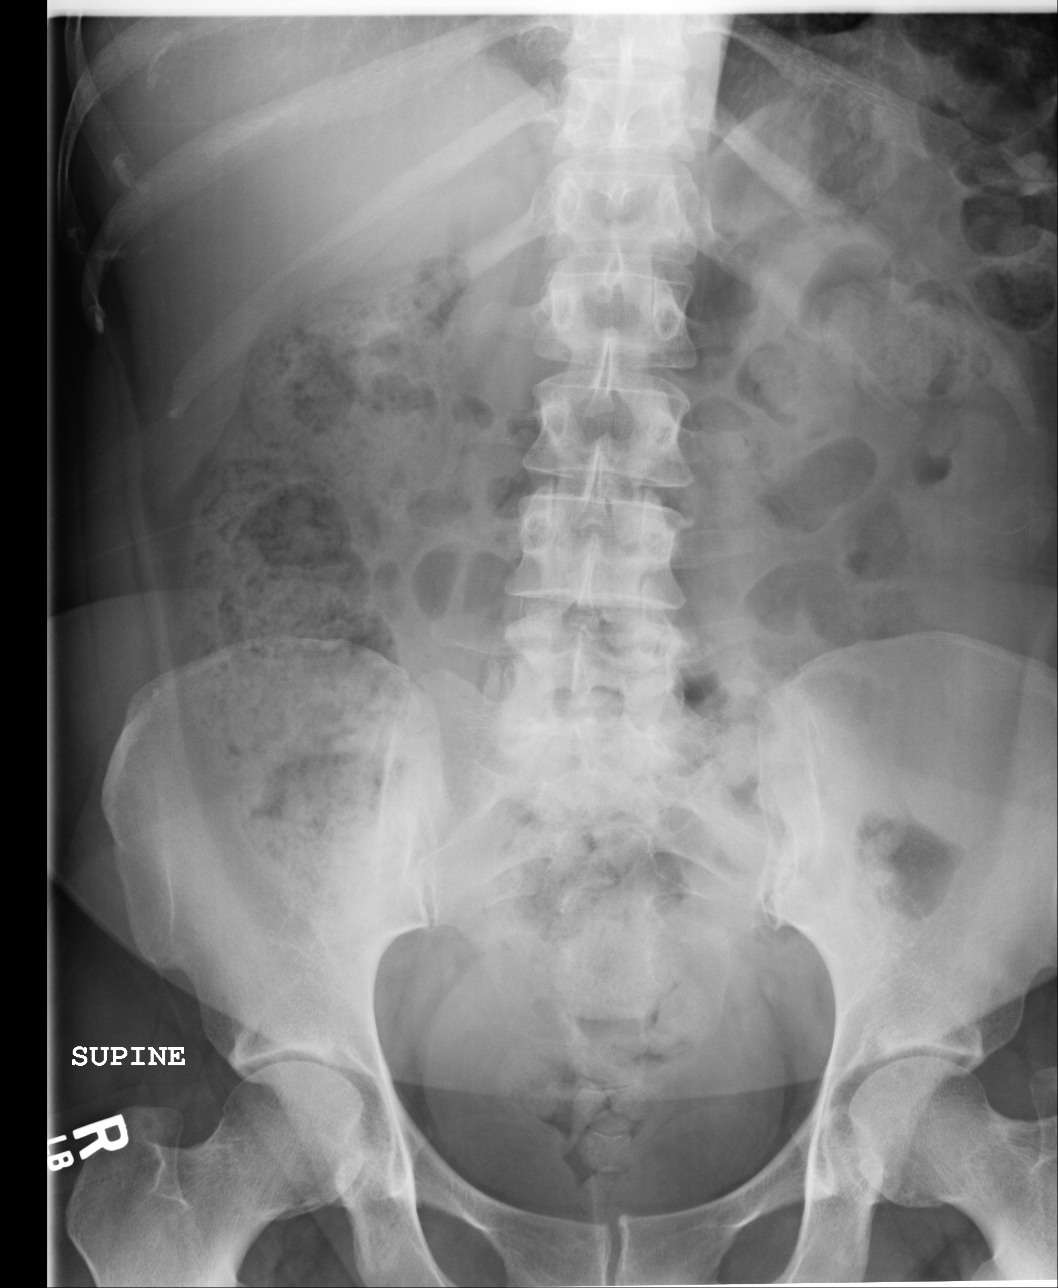
[im 4/4]
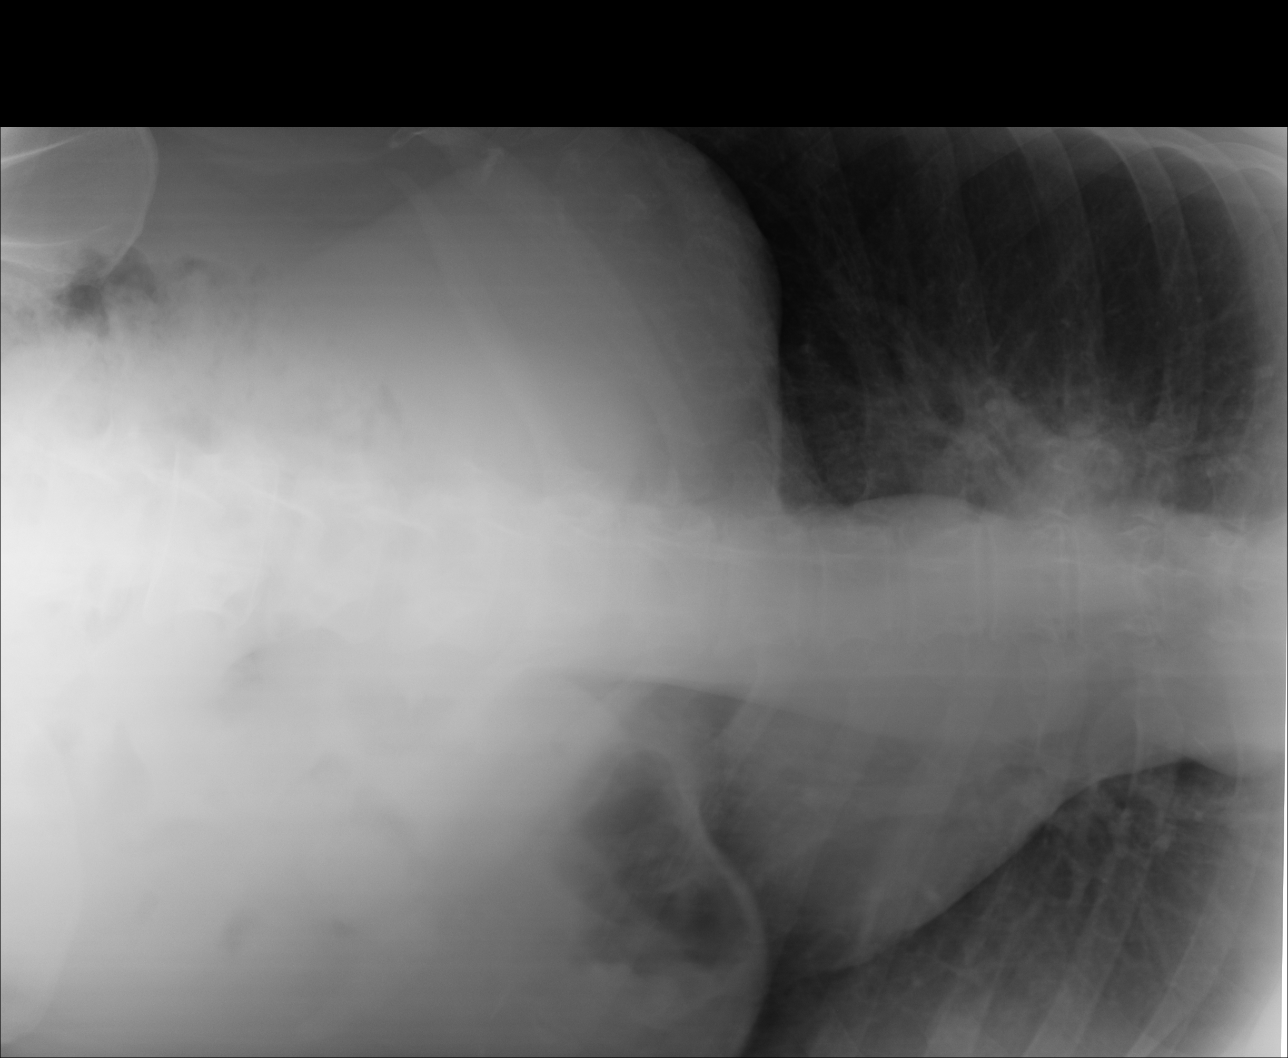

[4 of 4 positions shown; findings below may reference images not displayed]

PROCEDURE:     DXR - DXR ABDOMEN 3-WAY (INCL PA CXR)  - January 03, 2011  [DATE]

RESULT:     The lung fields are clear.

Three views of the abdomen were obtained. No subdiaphragmatic free air is
seen. The bowel gas pattern shows no specific abnormalities. No dilated
bowel loops are identified. There is a moderately large amount of fecal
material in the colon. No abnormal intra-abdominal calcifications are seen.
No acute bony abnormalities are identified.
IMPRESSION: There is a moderately large amount of fecal material in the
colon. Otherwise, normal study.

## 2013-09-19 ENCOUNTER — Other Ambulatory Visit (HOSPITAL_COMMUNITY)
Admission: RE | Admit: 2013-09-19 | Discharge: 2013-09-19 | Disposition: A | Discharge status: Home / Self Care | Payer: 59 | Source: Ambulatory Visit | Attending: Family Medicine | Admitting: Family Medicine

## 2013-09-19 DIAGNOSIS — Z01419 Encounter for gynecological examination (general) (routine) without abnormal findings: Secondary | ICD-10-CM | POA: Insufficient documentation

## 2014-03-19 ENCOUNTER — Other Ambulatory Visit: Payer: Self-pay | Admitting: Family Medicine

## 2014-03-19 DIAGNOSIS — R1900 Intra-abdominal and pelvic swelling, mass and lump, unspecified site: Secondary | ICD-10-CM

## 2014-03-23 ENCOUNTER — Inpatient Hospital Stay: Admission: RE | Admit: 2014-03-23 | Payer: 59 | Source: Ambulatory Visit

## 2014-04-27 ENCOUNTER — Encounter (HOSPITAL_COMMUNITY): Payer: Self-pay | Admitting: *Deleted

## 2015-10-06 DIAGNOSIS — R1013 Epigastric pain: Secondary | ICD-10-CM | POA: Diagnosis not present

## 2016-01-12 DIAGNOSIS — J069 Acute upper respiratory infection, unspecified: Secondary | ICD-10-CM | POA: Diagnosis not present

## 2016-02-17 DIAGNOSIS — R194 Change in bowel habit: Secondary | ICD-10-CM | POA: Diagnosis not present

## 2016-03-07 DIAGNOSIS — R194 Change in bowel habit: Secondary | ICD-10-CM | POA: Diagnosis not present

## 2016-03-07 DIAGNOSIS — D120 Benign neoplasm of cecum: Secondary | ICD-10-CM | POA: Diagnosis not present

## 2016-03-07 DIAGNOSIS — D126 Benign neoplasm of colon, unspecified: Secondary | ICD-10-CM | POA: Diagnosis not present

## 2016-03-11 DIAGNOSIS — D126 Benign neoplasm of colon, unspecified: Secondary | ICD-10-CM | POA: Diagnosis not present

## 2016-03-24 DIAGNOSIS — E785 Hyperlipidemia, unspecified: Secondary | ICD-10-CM | POA: Diagnosis not present

## 2016-03-24 DIAGNOSIS — I10 Essential (primary) hypertension: Secondary | ICD-10-CM | POA: Diagnosis not present

## 2016-03-24 DIAGNOSIS — D649 Anemia, unspecified: Secondary | ICD-10-CM | POA: Diagnosis not present

## 2016-03-24 DIAGNOSIS — G4700 Insomnia, unspecified: Secondary | ICD-10-CM | POA: Diagnosis not present

## 2016-05-02 ENCOUNTER — Other Ambulatory Visit (HOSPITAL_COMMUNITY)
Admission: RE | Admit: 2016-05-02 | Discharge: 2016-05-02 | Disposition: A | Discharge status: Home / Self Care | Payer: BLUE CROSS/BLUE SHIELD | Source: Ambulatory Visit | Attending: Nurse Practitioner | Admitting: Nurse Practitioner

## 2016-05-02 ENCOUNTER — Other Ambulatory Visit: Payer: Self-pay | Admitting: Nurse Practitioner

## 2016-05-02 DIAGNOSIS — Z01419 Encounter for gynecological examination (general) (routine) without abnormal findings: Secondary | ICD-10-CM | POA: Insufficient documentation

## 2016-05-02 DIAGNOSIS — Z1151 Encounter for screening for human papillomavirus (HPV): Secondary | ICD-10-CM | POA: Diagnosis not present

## 2016-05-03 LAB — CYTOLOGY - PAP
Diagnosis: NEGATIVE
HPV (WINDOPATH): NOT DETECTED

## 2016-07-03 DIAGNOSIS — M5136 Other intervertebral disc degeneration, lumbar region: Secondary | ICD-10-CM | POA: Diagnosis not present

## 2016-07-03 DIAGNOSIS — M545 Low back pain: Secondary | ICD-10-CM | POA: Diagnosis not present

## 2016-07-05 ENCOUNTER — Other Ambulatory Visit: Payer: Self-pay | Admitting: Orthopaedic Surgery

## 2016-07-05 DIAGNOSIS — M5136 Other intervertebral disc degeneration, lumbar region: Secondary | ICD-10-CM

## 2016-07-06 DIAGNOSIS — M6281 Muscle weakness (generalized): Secondary | ICD-10-CM | POA: Diagnosis not present

## 2016-07-06 DIAGNOSIS — M545 Low back pain: Secondary | ICD-10-CM | POA: Diagnosis not present

## 2016-08-04 ENCOUNTER — Inpatient Hospital Stay
Admission: RE | Admit: 2016-08-04 | Discharge: 2016-08-04 | Disposition: A | Discharge status: Home / Self Care | Payer: BLUE CROSS/BLUE SHIELD | Source: Ambulatory Visit | Attending: Orthopaedic Surgery | Admitting: Orthopaedic Surgery

## 2016-08-30 DIAGNOSIS — J208 Acute bronchitis due to other specified organisms: Secondary | ICD-10-CM | POA: Diagnosis not present

## 2016-09-25 DIAGNOSIS — R1084 Generalized abdominal pain: Secondary | ICD-10-CM | POA: Diagnosis not present

## 2016-09-25 DIAGNOSIS — R1013 Epigastric pain: Secondary | ICD-10-CM | POA: Diagnosis not present

## 2016-10-04 DIAGNOSIS — Z0000 Encounter for general adult medical examination without abnormal findings: Secondary | ICD-10-CM | POA: Diagnosis not present

## 2016-10-06 DIAGNOSIS — M25511 Pain in right shoulder: Secondary | ICD-10-CM | POA: Diagnosis not present

## 2016-10-06 DIAGNOSIS — M67911 Unspecified disorder of synovium and tendon, right shoulder: Secondary | ICD-10-CM | POA: Diagnosis not present

## 2017-01-18 DIAGNOSIS — Z1231 Encounter for screening mammogram for malignant neoplasm of breast: Secondary | ICD-10-CM | POA: Diagnosis not present

## 2017-02-08 DIAGNOSIS — G501 Atypical facial pain: Secondary | ICD-10-CM | POA: Diagnosis not present

## 2017-04-06 DIAGNOSIS — G4700 Insomnia, unspecified: Secondary | ICD-10-CM | POA: Diagnosis not present

## 2017-04-06 DIAGNOSIS — I10 Essential (primary) hypertension: Secondary | ICD-10-CM | POA: Diagnosis not present

## 2017-04-06 DIAGNOSIS — E785 Hyperlipidemia, unspecified: Secondary | ICD-10-CM | POA: Diagnosis not present

## 2017-04-06 DIAGNOSIS — F411 Generalized anxiety disorder: Secondary | ICD-10-CM | POA: Diagnosis not present

## 2017-05-03 DIAGNOSIS — Z01419 Encounter for gynecological examination (general) (routine) without abnormal findings: Secondary | ICD-10-CM | POA: Diagnosis not present

## 2017-09-05 DIAGNOSIS — I10 Essential (primary) hypertension: Secondary | ICD-10-CM | POA: Diagnosis not present

## 2017-09-05 DIAGNOSIS — E785 Hyperlipidemia, unspecified: Secondary | ICD-10-CM | POA: Diagnosis not present

## 2017-09-05 DIAGNOSIS — G4700 Insomnia, unspecified: Secondary | ICD-10-CM | POA: Diagnosis not present

## 2017-09-05 DIAGNOSIS — M2550 Pain in unspecified joint: Secondary | ICD-10-CM | POA: Diagnosis not present

## 2018-01-14 DIAGNOSIS — F411 Generalized anxiety disorder: Secondary | ICD-10-CM | POA: Diagnosis not present

## 2018-01-14 DIAGNOSIS — M25531 Pain in right wrist: Secondary | ICD-10-CM | POA: Diagnosis not present

## 2018-01-14 DIAGNOSIS — I10 Essential (primary) hypertension: Secondary | ICD-10-CM | POA: Diagnosis not present

## 2018-01-14 DIAGNOSIS — E785 Hyperlipidemia, unspecified: Secondary | ICD-10-CM | POA: Diagnosis not present

## 2018-01-22 DIAGNOSIS — M25511 Pain in right shoulder: Secondary | ICD-10-CM | POA: Diagnosis not present

## 2018-01-22 DIAGNOSIS — M25531 Pain in right wrist: Secondary | ICD-10-CM | POA: Diagnosis not present

## 2018-02-01 DIAGNOSIS — Z1231 Encounter for screening mammogram for malignant neoplasm of breast: Secondary | ICD-10-CM | POA: Diagnosis not present

## 2018-02-18 DIAGNOSIS — M654 Radial styloid tenosynovitis [de Quervain]: Secondary | ICD-10-CM | POA: Diagnosis not present

## 2018-02-18 DIAGNOSIS — M25511 Pain in right shoulder: Secondary | ICD-10-CM | POA: Diagnosis not present

## 2018-05-12 DIAGNOSIS — J069 Acute upper respiratory infection, unspecified: Secondary | ICD-10-CM | POA: Diagnosis not present

## 2018-05-17 DIAGNOSIS — M25531 Pain in right wrist: Secondary | ICD-10-CM | POA: Diagnosis not present

## 2018-05-17 DIAGNOSIS — M654 Radial styloid tenosynovitis [de Quervain]: Secondary | ICD-10-CM | POA: Diagnosis not present

## 2018-05-19 ENCOUNTER — Emergency Department (HOSPITAL_COMMUNITY)
Admission: EM | Admit: 2018-05-19 | Discharge: 2018-05-20 | Disposition: A | Discharge status: Home / Self Care | Payer: BLUE CROSS/BLUE SHIELD | Attending: Emergency Medicine | Admitting: Emergency Medicine

## 2018-05-19 ENCOUNTER — Other Ambulatory Visit: Payer: Self-pay

## 2018-05-19 ENCOUNTER — Emergency Department (HOSPITAL_COMMUNITY): Payer: BLUE CROSS/BLUE SHIELD

## 2018-05-19 DIAGNOSIS — I10 Essential (primary) hypertension: Secondary | ICD-10-CM | POA: Insufficient documentation

## 2018-05-19 DIAGNOSIS — R0602 Shortness of breath: Secondary | ICD-10-CM | POA: Diagnosis not present

## 2018-05-19 DIAGNOSIS — F419 Anxiety disorder, unspecified: Secondary | ICD-10-CM | POA: Diagnosis not present

## 2018-05-19 DIAGNOSIS — Z79899 Other long term (current) drug therapy: Secondary | ICD-10-CM | POA: Diagnosis not present

## 2018-05-19 DIAGNOSIS — R079 Chest pain, unspecified: Secondary | ICD-10-CM | POA: Diagnosis not present

## 2018-05-19 DIAGNOSIS — R197 Diarrhea, unspecified: Secondary | ICD-10-CM | POA: Insufficient documentation

## 2018-05-19 DIAGNOSIS — R109 Unspecified abdominal pain: Secondary | ICD-10-CM | POA: Diagnosis not present

## 2018-05-19 DIAGNOSIS — R112 Nausea with vomiting, unspecified: Secondary | ICD-10-CM | POA: Insufficient documentation

## 2018-05-19 LAB — CBC WITH DIFFERENTIAL/PLATELET
ABS IMMATURE GRANULOCYTES: 0.11 10*3/uL — AB (ref 0.00–0.07)
BASOS PCT: 0 %
Basophils Absolute: 0.1 10*3/uL (ref 0.0–0.1)
EOS PCT: 1 %
Eosinophils Absolute: 0.1 10*3/uL (ref 0.0–0.5)
HCT: 37 % (ref 36.0–46.0)
Hemoglobin: 12.7 g/dL (ref 12.0–15.0)
Immature Granulocytes: 1 %
LYMPHS PCT: 12 %
Lymphs Abs: 2.3 10*3/uL (ref 0.7–4.0)
MCH: 30.5 pg (ref 26.0–34.0)
MCHC: 34.3 g/dL (ref 30.0–36.0)
MCV: 88.7 fL (ref 80.0–100.0)
MONO ABS: 1.2 10*3/uL — AB (ref 0.1–1.0)
Monocytes Relative: 6 %
NEUTROS ABS: 15.5 10*3/uL — AB (ref 1.7–7.7)
Neutrophils Relative %: 80 %
PLATELETS: 388 10*3/uL (ref 150–400)
RBC: 4.17 MIL/uL (ref 3.87–5.11)
RDW: 12.9 % (ref 11.5–15.5)
WBC: 19.3 10*3/uL — ABNORMAL HIGH (ref 4.0–10.5)
nRBC: 0 % (ref 0.0–0.2)

## 2018-05-19 LAB — COMPREHENSIVE METABOLIC PANEL
ALT: 28 U/L (ref 0–44)
ANION GAP: 10 (ref 5–15)
AST: 26 U/L (ref 15–41)
Albumin: 4.8 g/dL (ref 3.5–5.0)
Alkaline Phosphatase: 51 U/L (ref 38–126)
BUN: 27 mg/dL — ABNORMAL HIGH (ref 6–20)
CO2: 29 mmol/L (ref 22–32)
CREATININE: 0.58 mg/dL (ref 0.44–1.00)
Calcium: 9.6 mg/dL (ref 8.9–10.3)
Chloride: 104 mmol/L (ref 98–111)
Glucose, Bld: 127 mg/dL — ABNORMAL HIGH (ref 70–99)
Potassium: 3.3 mmol/L — ABNORMAL LOW (ref 3.5–5.1)
Sodium: 143 mmol/L (ref 135–145)
TOTAL PROTEIN: 7.7 g/dL (ref 6.5–8.1)
Total Bilirubin: 1 mg/dL (ref 0.3–1.2)

## 2018-05-19 LAB — I-STAT CG4 LACTIC ACID, ED: LACTIC ACID, VENOUS: 0.59 mmol/L (ref 0.5–1.9)

## 2018-05-19 LAB — LIPASE, BLOOD: LIPASE: 28 U/L (ref 11–51)

## 2018-05-19 LAB — I-STAT BETA HCG BLOOD, ED (MC, WL, AP ONLY): I-stat hCG, quantitative: <5 m[IU]/mL (ref <5)

## 2018-05-19 LAB — I-STAT TROPONIN, ED: TROPONIN I, POC: 0 ng/mL (ref 0.00–0.08)

## 2018-05-19 MED ORDER — ONDANSETRON HCL 4 MG/2ML IJ SOLN
4.0000 mg | Freq: Once | INTRAMUSCULAR | Status: AC
  Administered 2018-05-19: 4 mg via INTRAVENOUS
  Filled 2018-05-19: qty 2

## 2018-05-19 MED ORDER — SODIUM CHLORIDE 0.9 % IV BOLUS
1000.0000 mL | Freq: Once | INTRAVENOUS | Status: AC
  Administered 2018-05-20: 1000 mL via INTRAVENOUS

## 2018-05-19 MED ORDER — FAMOTIDINE IN NACL 20-0.9 MG/50ML-% IV SOLN
20.0000 mg | Freq: Once | INTRAVENOUS | Status: AC
  Administered 2018-05-19: 20 mg via INTRAVENOUS
  Filled 2018-05-19: qty 50

## 2018-05-19 MED ORDER — SODIUM CHLORIDE 0.9 % IV BOLUS
1000.0000 mL | Freq: Once | INTRAVENOUS | Status: AC
  Administered 2018-05-19: 1000 mL via INTRAVENOUS

## 2018-05-19 NOTE — ED Provider Notes (Signed)
[facility]-EMERGENCY DEPT Provider Note   CSN: [id number] Arrival date & time: 05/19/18  2141     History   Chief Complaint Chief Complaint  Patient presents with  . Chest Pain  . Emesis    HPI Amanda White is a 52 y.o. female with a history of anxiety, bipolar 1 disorder, depression, HTN, and migraine who presents to the emergency department with a chief complaint of vomiting.  The patient endorses countless episodes of nausea and nonbloody, nonbilious emesis that began ~1600 followed by epigastric abdominal pain she describes as "pressure" with "pressure-like" substernal chest pain, dyspnea and non-bloody diarrhea. She reports associated chills, diaphoresis, and feeling generally weak.   She reports her symptoms began several hours after eating lunch at [employer].  Her husband reports that her meal came out with the wrong sauce on it and it had to be sent back so everyone else in the family had another meal.  No other known sick contacts.  No recent travel.  She reports she was feeling her baseline prior to the onset of her symptoms.   She denies dysuria, hematuria, vaginal pain, itching, discharge, back pain, headache, dizziness, palpitations, le swelling, or lightheadedness.   She treated her symptoms at home with one dose of kaopectate without improvement. No h/o of abdominal surgery. She is a never smoker and denies ETOH and IV or recreational drug use.  She reports a family history of cardiovascular disease.  The history is provided by the patient. No language interpreter was used.    Past Medical History:  Diagnosis Date  . Anxiety   . Bipolar 1 disorder   . Depression   . Depression   . Hypertension   . Migraine     There are no active problems to display for this patient.   Past Surgical History:  Procedure Laterality Date  . DILATION AND CURETTAGE OF UTERUS       OB History    Gravida  3   Para  2   Term  2   Preterm  0   AB  1   Living  2     SAB  1   TAB  0   Ectopic  0   Multiple  0   Live Births               Home Medications    Prior to Admission medications   Medication Sig Start Date End Date Taking? Authorizing Provider  amphetamine-dextroamphetamine (ADDERALL) 20 MG tablet Take 20 mg by mouth 2 (two) times daily.    [provider]  clonazePAM (KLONOPIN) 2 MG tablet Take 2 mg by mouth at bedtime as needed. For sleep and anxiety prevention Scheduled dose    [provider]  DULoxetine (CYMBALTA) 30 MG capsule Take 90 mg by mouth daily.    [provider]  norgestimate-ethinyl estradiol (ORTHO-CYCLEN,SPRINTEC,PREVIFEM) 0.25-35 MG-MCG tablet Take 1 tablet by mouth every evening.    [provider]  ZOLMitriptan (ZOMIG) 2.5 MG tablet Take 2.5 mg by mouth daily as needed. For headache    [provider]    Family History Family History  Problem Relation Age of Onset  . Hypertension Mother   . Stroke Father     Social History Social History   Tobacco Use  . Smoking status: Never Smoker  Substance Use Topics  . Alcohol use: No  . Drug use: No     Allergies   Patient has no known  allergies.   Review of Systems Review of Systems  Constitutional: Positive for chills and diaphoresis. Negative for activity change and fever.  HENT: Negative for congestion, ear pain and sneezing.   Eyes: Negative for visual disturbance.  Respiratory: Positive for shortness of breath. Negative for cough and wheezing.   Cardiovascular: Positive for chest pain. Negative for palpitations and leg swelling.  Gastrointestinal: Positive for abdominal pain, diarrhea, nausea and vomiting.  Genitourinary: Negative for dysuria, frequency, pelvic pain and urgency.  Musculoskeletal: Negative for back pain.  Skin: Negative for rash.  Allergic/Immunologic: Negative for immunocompromised state.  Neurological: Positive for weakness (generalized). Negative for  dizziness, numbness and headaches.  Psychiatric/Behavioral: Negative for confusion.    Physical Exam Updated Vital Signs BP 117/73   Pulse 95   Temp (!) 97.5 F (36.4 C) (Rectal)   Resp (!) 24   Ht 5' 4.75" (1.645 m)   Wt 78.5 kg   SpO2 99%   BMI 29.01 kg/m   Physical Exam  Constitutional: She appears ill. No distress.  Diaphoretic and uncomfortable appearing  HENT:  Head: Normocephalic.  Eyes: Conjunctivae are normal. No scleral icterus.  Neck: Normal range of motion. Neck supple.  Cardiovascular: Normal rate, regular rhythm, normal heart sounds and intact distal pulses. Exam reveals no gallop and no friction rub.  No murmur heard. Pulmonary/Chest: Effort normal. No stridor. No respiratory distress. She has no wheezes. She has no rales. She exhibits no tenderness.  Abdominal: Soft. She exhibits no distension and no mass. There is tenderness. There is no rebound and no guarding. No hernia.  Diffusely tender to palpation throughout the abdomen.  Abdomen is soft, nondistended.  No CVA tenderness bilaterally.  Mildly hyperactive bowel sounds in all 4 quadrants.  Musculoskeletal: She exhibits no tenderness.  Neurological: She is alert.  Skin: Skin is warm. Capillary refill takes less than 2 seconds. No rash noted. She is diaphoretic. No erythema.  Psychiatric: Her behavior is normal.  Nursing note and vitals reviewed.  ED Treatments / Results  Labs (all labs ordered are listed, but only abnormal results are displayed) Labs Reviewed  CBC WITH DIFFERENTIAL/PLATELET - Abnormal; Notable for the following components:      Result Value   WBC 19.3 (*)    Neutro Abs 15.5 (*)    Monocytes Absolute 1.2 (*)    Abs Immature Granulocytes 0.11 (*)    All other components within normal limits  COMPREHENSIVE METABOLIC PANEL - Abnormal; Notable for the following components:   Potassium 3.3 (*)    Glucose, Bld 127 (*)    BUN 27 (*)    All other components within normal limits    URINALYSIS, ROUTINE W REFLEX MICROSCOPIC - Abnormal; Notable for the following components:   Ketones, ur 20 (*)    All other components within normal limits  LIPASE, BLOOD  I-STAT TROPONIN, ED  I-STAT BETA HCG BLOOD, ED ([facility], [facility], [facility] ONLY)  I-STAT CG4 LACTIC ACID, ED    EKG EKG Interpretation  Date/Time:  Sunday May 19 2018 21:50:22 EST Ventricular Rate:  84 PR Interval:    QRS Duration: 93 QT Interval:  385 QTC Calculation: 456 R Axis:   56 Text Interpretation:  Sinus rhythm No significant change since last tracing Confirmed by Melene PlanFloyd, Dan [phone]([id number]) on 05/19/2018 11:35:52 PM   Radiology Dg Chest 2 View  Result Date: 05/19/2018 CLINICAL DATA:  Chest pain and shortness of breath beginning today. Vomiting and diarrhea. EXAM: CHEST - 2 VIEW COMPARISON:  07/12/2006 FINDINGS: The  heart size and mediastinal contours are within normal limits. Aortic atherosclerosis. Both lungs are clear. The visualized skeletal structures are unremarkable. IMPRESSION: No active cardiopulmonary disease. Electronically Signed   By: Myles Rosenthal M.D.   On: 05/19/2018 23:12    Procedures Procedures (including critical care time)  Medications Ordered in ED Medications  sodium chloride 0.9 % bolus 1,000 mL (has no administration in time range)  promethazine (PHENERGAN) injection 25 mg (has no administration in time range)  sodium chloride 0.9 % bolus 1,000 mL (0 mLs Intravenous Stopped 05/19/18 2313)  ondansetron (ZOFRAN) injection 4 mg (4 mg Intravenous Given 05/19/18 2211)  famotidine (PEPCID) IVPB 20 mg premix ( Intravenous Rate/Dose Verify 05/19/18 2321)     Initial Impression / Assessment and Plan / ED Course  I have reviewed the triage vital signs and the nursing notes.  Pertinent labs & imaging results that were available during my care of the patient were reviewed by me and considered in my medical decision making (see chart for details).     52 year old female with a history of anxiety,  bipolar 1 disorder, depression, HTN, and migraine who presents to the emergency department with a chief complaint of N/V, chest pain, dyspnea, abdominal pain, diarrhea, and generalized weakness.   On her exam, she is tender to palpation in the epigastric region.  No rebound or guarding.  She does not have a surgical abdomen.  EKG with normal sinus rhythm and unchanged from previous.  Initial troponin is negative.  Chest x-ray is unremarkable.  Labs are otherwise notable for mild ketonuria on her UA and a leukocytosis of 19.  She has been given Zofran, Pepcid, and IV fluids.  Heart score is low risk.  Will repeat her troponin and if it is negative, plan to fluid challenge the patient.    Suspect viral etiology given the rapid onset of her symptoms.  On re-examination, the patient reports that her pain is improved, but she is still feeling nauseated.  Will order Phenergan.   Patient care transferred to [facility] at the end of my shift. Patient presentation, ED course, and plan of care discussed with review of all pertinent labs and imaging. Please see his/her note for further details regarding further ED course and disposition.  Final Clinical Impressions(s) / ED Diagnoses   Final diagnoses:  None    ED Discharge Orders    None       McDonald, Mia A, PA-C 05/20/18 0018    Melene Plan, DO 05/21/18 1122

## 2018-05-19 NOTE — ED Triage Notes (Signed)
Pts [facility]. Pt has been vomiting since they left. Pt arrived to ED diaphoretic, weak, and lethargic.

## 2018-05-19 NOTE — ED Triage Notes (Signed)
Pt started vomiting around 4 or 5pm and developed chest pain, shortness of breath.  Pt described pain as pressure like.  Pt c/o diarrehea in addition to vomiting.  Pt unable to drink water without throwing up. Pt took one dose of carapectate.  Pt weak, lethargic, diaphoretic.  Pt c/o chills.  Pt went out to eat with family for lunch, ate the same food as family who are not sick.

## 2018-05-20 LAB — URINALYSIS, ROUTINE W REFLEX MICROSCOPIC
BILIRUBIN URINE: NEGATIVE
Glucose, UA: NEGATIVE mg/dL
HGB URINE DIPSTICK: NEGATIVE
Ketones, ur: 20 mg/dL — AB
Leukocytes, UA: NEGATIVE
Nitrite: NEGATIVE
PROTEIN: NEGATIVE mg/dL
Specific Gravity, Urine: 1.018 (ref 1.005–1.030)
pH: 7 (ref 5.0–8.0)

## 2018-05-20 LAB — I-STAT TROPONIN, ED: TROPONIN I, POC: 0 ng/mL (ref 0.00–0.08)

## 2018-05-20 MED ORDER — PROMETHAZINE HCL 25 MG RE SUPP: 25.0000 mg | Freq: Four times a day (QID) | RECTAL | 0 refills | Status: DC | PRN

## 2018-05-20 MED ORDER — PROMETHAZINE HCL 25 MG/ML IJ SOLN
25.0000 mg | Freq: Once | INTRAMUSCULAR | Status: AC
  Administered 2018-05-20: 25 mg via INTRAVENOUS
  Filled 2018-05-20: qty 1

## 2018-05-20 MED ORDER — ONDANSETRON 4 MG PO TBDP: 4.0000 mg | ORAL_TABLET | Freq: Three times a day (TID) | ORAL | 0 refills | Status: DC | PRN

## 2018-05-20 NOTE — ED Provider Notes (Signed)
1:57 AM Patient care assumed from [facility], PA-C at change of shift.  Patient presenting for multiple complaints.  Plan discussed with Opal Sidlesonald, PA-C which includes delta troponin.  If negative, appropriate for discharge home.  Delta troponin reviewed which is 0.  The patient has remained hemodynamically stable.  Will provide prescriptions for Zofran, Phenergan for home symptomatic management.  Encourage primary care follow-up.  Return precautions discussed and provided. Patient discharged in stable condition with no unaddressed concerns.   Antony MaduraHumes, Celester Lech, PA-C 05/20/18 0159    Nira Connardama, Pedro Eduardo, MD 05/20/18 0630

## 2018-05-20 NOTE — Discharge Instructions (Signed)
Thank you for allowing me to care for you today in the Emergency Department.   Take 1 tablet of Zofran and let it dissolve under your tongue every 8 hours as needed for nausea or vomiting.  If you continue to have vomiting despite taking Zofran, you can place 1 suppository of Phenergan and your rectum every 6 hours.  Take 650 mg of Tylenol every 6 hours at home for pain control.  Try following a bland diet for the next few days to help improve your pain.  More information is attached.  Follow-up with your primary care provider for recheck in the next 3 to 4 days.  You can return to work when you have been free from a fever, a temperature greater than 100.5, or diarrhea for more than 24 hours.  Return to the emergency department if you develop vomiting despite taking Zofran or Phenergan, if you stop producing urine, if you develop a fever despite taking Tylenol as described above, dark black or bright red blood in your vomit or stool, or other new, concerning symptoms.

## 2018-07-25 DIAGNOSIS — I10 Essential (primary) hypertension: Secondary | ICD-10-CM | POA: Diagnosis not present

## 2018-07-25 DIAGNOSIS — E785 Hyperlipidemia, unspecified: Secondary | ICD-10-CM | POA: Diagnosis not present

## 2018-07-25 DIAGNOSIS — G4700 Insomnia, unspecified: Secondary | ICD-10-CM | POA: Diagnosis not present

## 2018-07-25 DIAGNOSIS — R7309 Other abnormal glucose: Secondary | ICD-10-CM | POA: Diagnosis not present

## 2018-08-26 DIAGNOSIS — R7989 Other specified abnormal findings of blood chemistry: Secondary | ICD-10-CM | POA: Diagnosis not present

## 2018-09-26 DIAGNOSIS — J988 Other specified respiratory disorders: Secondary | ICD-10-CM | POA: Diagnosis not present

## 2019-01-21 DIAGNOSIS — J988 Other specified respiratory disorders: Secondary | ICD-10-CM | POA: Diagnosis not present

## 2019-01-21 DIAGNOSIS — I10 Essential (primary) hypertension: Secondary | ICD-10-CM | POA: Diagnosis not present

## 2019-01-21 DIAGNOSIS — F411 Generalized anxiety disorder: Secondary | ICD-10-CM | POA: Diagnosis not present

## 2019-01-21 DIAGNOSIS — G4700 Insomnia, unspecified: Secondary | ICD-10-CM | POA: Diagnosis not present

## 2020-06-22 ENCOUNTER — Encounter (HOSPITAL_COMMUNITY): Payer: Self-pay

## 2020-06-22 ENCOUNTER — Other Ambulatory Visit: Payer: Self-pay

## 2020-06-22 ENCOUNTER — Emergency Department (HOSPITAL_COMMUNITY)
Admission: EM | Admit: 2020-06-22 | Discharge: 2020-06-22 | Disposition: A | Discharge status: Home / Self Care | Payer: 59 | Attending: Emergency Medicine | Admitting: Emergency Medicine

## 2020-06-22 DIAGNOSIS — M791 Myalgia, unspecified site: Secondary | ICD-10-CM | POA: Diagnosis not present

## 2020-06-22 DIAGNOSIS — I10 Essential (primary) hypertension: Secondary | ICD-10-CM | POA: Diagnosis not present

## 2020-06-22 DIAGNOSIS — R1013 Epigastric pain: Secondary | ICD-10-CM | POA: Insufficient documentation

## 2020-06-22 DIAGNOSIS — R000 Tachycardia, unspecified: Secondary | ICD-10-CM | POA: Insufficient documentation

## 2020-06-22 DIAGNOSIS — Z79899 Other long term (current) drug therapy: Secondary | ICD-10-CM | POA: Diagnosis not present

## 2020-06-22 DIAGNOSIS — R519 Headache, unspecified: Secondary | ICD-10-CM | POA: Insufficient documentation

## 2020-06-22 DIAGNOSIS — Z20822 Contact with and (suspected) exposure to covid-19: Secondary | ICD-10-CM | POA: Diagnosis not present

## 2020-06-22 DIAGNOSIS — R0609 Other forms of dyspnea: Secondary | ICD-10-CM | POA: Insufficient documentation

## 2020-06-22 DIAGNOSIS — R059 Cough, unspecified: Secondary | ICD-10-CM | POA: Diagnosis not present

## 2020-06-22 LAB — COMPREHENSIVE METABOLIC PANEL
ALT: 22 U/L (ref 0–44)
AST: 21 U/L (ref 15–41)
Albumin: 4.1 g/dL (ref 3.5–5.0)
Alkaline Phosphatase: 49 U/L (ref 38–126)
Anion gap: 11 (ref 5–15)
BUN: 21 mg/dL — ABNORMAL HIGH (ref 6–20)
CO2: 24 mmol/L (ref 22–32)
Calcium: 8.8 mg/dL — ABNORMAL LOW (ref 8.9–10.3)
Chloride: 103 mmol/L (ref 98–111)
Creatinine, Ser: 0.62 mg/dL (ref 0.44–1.00)
GFR, Estimated: >60 mL/min (ref >60)
Glucose, Bld: 95 mg/dL (ref 70–99)
Potassium: 3.4 mmol/L — ABNORMAL LOW (ref 3.5–5.1)
Sodium: 138 mmol/L (ref 135–145)
Total Bilirubin: 0.6 mg/dL (ref 0.3–1.2)
Total Protein: 6.9 g/dL (ref 6.5–8.1)

## 2020-06-22 LAB — RESP PANEL BY RT-PCR (FLU A&B, COVID) ARPGX2
Influenza A by PCR: NEGATIVE
Influenza B by PCR: NEGATIVE
SARS Coronavirus 2 by RT PCR: NEGATIVE

## 2020-06-22 LAB — CBC WITH DIFFERENTIAL/PLATELET
Abs Immature Granulocytes: 0.02 10*3/uL (ref 0.00–0.07)
Basophils Absolute: 0 10*3/uL (ref 0.0–0.1)
Basophils Relative: 0 %
Eosinophils Absolute: 0.1 10*3/uL (ref 0.0–0.5)
Eosinophils Relative: 2 %
HCT: 35.9 % — ABNORMAL LOW (ref 36.0–46.0)
Hemoglobin: 12.8 g/dL (ref 12.0–15.0)
Immature Granulocytes: 0 %
Lymphocytes Relative: 16 %
Lymphs Abs: 1.3 10*3/uL (ref 0.7–4.0)
MCH: 32.2 pg (ref 26.0–34.0)
MCHC: 35.7 g/dL (ref 30.0–36.0)
MCV: 90.2 fL (ref 80.0–100.0)
Monocytes Absolute: 1 10*3/uL (ref 0.1–1.0)
Monocytes Relative: 12 %
Neutro Abs: 5.5 10*3/uL (ref 1.7–7.7)
Neutrophils Relative %: 70 %
Platelets: 315 10*3/uL (ref 150–400)
RBC: 3.98 MIL/uL (ref 3.87–5.11)
RDW: 12.3 % (ref 11.5–15.5)
WBC: 7.9 10*3/uL (ref 4.0–10.5)
nRBC: 0 % (ref 0.0–0.2)

## 2020-06-22 LAB — LIPASE, BLOOD: Lipase: 21 U/L (ref 11–51)

## 2020-06-22 MED ORDER — PANTOPRAZOLE SODIUM 20 MG PO TBEC
20.0000 mg | DELAYED_RELEASE_TABLET | Freq: Once | ORAL | Status: AC
  Administered 2020-06-22: 20 mg via ORAL
  Filled 2020-06-22: qty 1

## 2020-06-22 MED ORDER — ALUM & MAG HYDROXIDE-SIMETH 200-200-20 MG/5ML PO SUSP
30.0000 mL | Freq: Once | ORAL | Status: AC
  Administered 2020-06-22: 30 mL via ORAL
  Filled 2020-06-22: qty 30

## 2020-06-22 MED ORDER — KETOROLAC TROMETHAMINE 30 MG/ML IJ SOLN
30.0000 mg | Freq: Once | INTRAMUSCULAR | Status: AC
  Administered 2020-06-22: 30 mg via INTRAMUSCULAR
  Filled 2020-06-22: qty 1

## 2020-06-22 MED ORDER — LIDOCAINE VISCOUS HCL 2 % MT SOLN
15.0000 mL | Freq: Once | OROMUCOSAL | Status: AC
  Administered 2020-06-22: 15 mL via ORAL
  Filled 2020-06-22: qty 15

## 2020-06-22 NOTE — ED Triage Notes (Signed)
Pt presents with c/o cough, generalized body aches, and chills for 3 days.

## 2020-06-22 NOTE — Discharge Instructions (Addendum)
As discussed, your Covid and influenza test were negative. All your labs are reassuring today. I suspect you have another viral infection.  Continue taking your reflux medication as prescribed for your reflux.  Please follow-up with PCP within the next week for further evaluation.  Return to the ER for new or worsening symptoms.

## 2020-06-22 NOTE — ED Provider Notes (Signed)
[facility]-EMERGENCY DEPT Provider Note   CSN: [id number] Arrival date & time: 06/22/20  4098     History Chief Complaint  Patient presents with  . Cough  . Generalized Body Aches    Amanda White is a 54 y.o. female with a past medical history significant for anxiety, bipolar 1 disorder, depression, hypertension, and history of migraines who presents to the ED due to cough, myalgias, and chills x3 days.  Patient admits to a dry cough.  Denies fever, admits to chills.  Denies Covid exposures and sick contacts.  She has received both her Covid vaccines, but not her booster shot.  Patient also admits to epigastric pain which she describes as a burning sensation.  She notes she has had problems with reflux in the past and states this feels much different.  She has been taking OTC reflux medication with no relief. Admits to occasional alcohol consumption, but slightly more than normal over the past few days. Denies chest pain, admits to intermittent shortness of breath with exertion.  No treatment prior to arrival.  No aggravating or alleviating factors.  Denies nausea, vomiting, diarrhea.  Denies history of blood clots, recent surgeries, recent long immobilizations, hormonal treatments, lower extremity edema, history of cancer, hemoptysis, and trauma. She also admits to a bilateral, dull frontal headache. Denies associated visual and speech changes. Denies unilateral weakness.  History obtained from patient and past medical records. No interpreter used during encounter.      Past Medical History:  Diagnosis Date  . Anxiety   . Bipolar 1 disorder (HCC)   . Depression   . Depression   . Hypertension   . Migraine     There are no problems to display for this patient.   Past Surgical History:  Procedure Laterality Date  . DILATION AND CURETTAGE OF UTERUS       OB History    Gravida  3   Para  2   Term  2   Preterm  0   AB  1   Living  2     SAB   1   IAB  0   Ectopic  0   Multiple  0   Live Births              Family History  Problem Relation Age of Onset  . Hypertension Mother   . Stroke Father     Social History   Tobacco Use  . Smoking status: Never Smoker  Substance Use Topics  . Alcohol use: No  . Drug use: No    Home Medications Prior to Admission medications   Medication Sig Start Date End Date Taking? Authorizing Provider  atorvastatin (LIPITOR) 20 MG tablet Take 20 mg by mouth daily. 05/15/18   [provider]  clonazePAM (KLONOPIN) 2 MG tablet Take 2 mg by mouth at bedtime.     [provider]  cyclobenzaprine (FLEXERIL) 10 MG tablet Take 10 mg by mouth daily as needed for muscle spasms.  04/15/18   [provider]  lisinopril-hydrochlorothiazide (PRINZIDE,ZESTORETIC) 10-12.5 MG tablet Take 1 tablet by mouth daily. 05/15/18   [provider]  omeprazole (PRILOSEC) 20 MG capsule Take 20 mg by mouth daily as needed (reflux).  05/15/18   [provider]  ondansetron (ZOFRAN ODT) 4 MG disintegrating tablet Take 1 tablet (4 mg total) by mouth every 8 (eight) hours as needed for nausea or vomiting. 05/20/18   McDonald, Mia A, PA-C  promethazine (  PHENERGAN) 25 MG suppository Place 1 suppository (25 mg total) rectally every 6 (six) hours as needed for nausea or vomiting. 05/20/18   McDonald, Mia A, PA-C    Allergies    Patient has no known allergies.  Review of Systems   Review of Systems  Constitutional: Positive for chills. Negative for fever.  Eyes: Negative for visual disturbance.  Respiratory: Positive for cough and shortness of breath.   Cardiovascular: Negative for chest pain and leg swelling.  Gastrointestinal: Positive for abdominal pain (epigastric burning). Negative for diarrhea, nausea and vomiting.  Genitourinary: Negative for dysuria.  Musculoskeletal: Positive for myalgias.  Neurological: Positive for headaches.  All other systems reviewed  and are negative.   Physical Exam Updated Vital Signs BP (!) 153/87   Pulse 90   Temp 98.3 F (36.8 C) (Oral)   Resp 18   LMP 12/20/2011   SpO2 96%   Physical Exam Vitals and nursing note reviewed.  Constitutional:      General: She is not in acute distress.    Appearance: She is not toxic-appearing.  HENT:     Head: Normocephalic.  Eyes:     Pupils: Pupils are equal, round, and reactive to light.  Neck:     Comments: No meningismus. Cardiovascular:     Rate and Rhythm: Normal rate and regular rhythm.     Pulses: Normal pulses.     Heart sounds: Normal heart sounds. No murmur heard. No friction rub. No gallop.   Pulmonary:     Effort: Pulmonary effort is normal.     Breath sounds: Normal breath sounds.     Comments: Respirations equal and unlabored, patient able to speak in full sentences, lungs clear to auscultation bilaterally Abdominal:     General: Abdomen is flat. Bowel sounds are normal. There is no distension.     Palpations: Abdomen is soft.     Tenderness: There is abdominal tenderness. There is no guarding or rebound.     Comments: Tenderness in epigastric region.  No rebound or guarding.  Musculoskeletal:     Cervical back: Neck supple.     Comments: No lower extremity edema.  Negative Homans' sign bilaterally  Skin:    General: Skin is warm and dry.  Neurological:     General: No focal deficit present.     Mental Status: She is alert.  Psychiatric:        Mood and Affect: Mood normal.        Behavior: Behavior normal.     ED Results / Procedures / Treatments   Labs (all labs ordered are listed, but only abnormal results are displayed) Labs Reviewed  CBC WITH DIFFERENTIAL/PLATELET - Abnormal; Notable for the following components:      Result Value   HCT 35.9 (*)    All other components within normal limits  COMPREHENSIVE METABOLIC PANEL - Abnormal; Notable for the following components:   Potassium 3.4 (*)    BUN 21 (*)    Calcium 8.8 (*)     All other components within normal limits  RESP PANEL BY RT-PCR (FLU A&B, COVID) ARPGX2  LIPASE, BLOOD    EKG None  Radiology No results found.  Procedures Procedures (including critical care time)  Medications Ordered in ED Medications  alum & mag hydroxide-simeth (MAALOX/MYLANTA) 200-200-20 MG/5ML suspension 30 mL (30 mLs Oral Given 06/22/20 1321)    And  lidocaine (XYLOCAINE) 2 % viscous mouth solution 15 mL (15 mLs Oral Given 06/22/20 1321)  ketorolac (TORADOL)  30 MG/ML injection 30 mg (30 mg Intramuscular Given 06/22/20 1321)  pantoprazole (PROTONIX) EC tablet 20 mg (20 mg Oral Given 06/22/20 1439)    ED Course  I have reviewed the triage vital signs and the nursing notes.  Pertinent labs & imaging results that were available during my care of the patient were reviewed by me and considered in my medical decision making (see chart for details).  Clinical Course as of 06/22/20 1502  Tue Jun 22, 2020  1243 Pulse Rate(!): 103 [CA]  1243 BP(!): 140/97 [CA]  1427 SARS Coronavirus 2 by RT PCR: NEGATIVE [CA]  1427 Influenza A By PCR: NEGATIVE [CA]  1427 Influenza B By PCR: NEGATIVE [CA]  1427 Lipase: 21 [CA]  1427 Potassium(!): 3.4 [CA]    Clinical Course User Index [CA] Mannie Stabile, PA-C   MDM Rules/Calculators/A&P                         54 year old female presents to the ED due to cough, myalgias, chills, headache, and epigastric pain x3 days.  Denies sick contacts and known Covid exposures.  She has received both her Covid vaccines however, has not received her booster shot.  Upon arrival, patient is afebrile with mild tachycardia in the low 100s. No hypoxia.  Patient in no acute distress and nontoxic-appearing.  Physical exam reassuring.  Lungs clear to auscultation bilaterally.  No rales, rhonchi, or wheeze.  Low suspicion for pneumonia.  No meningismus to suggest meningitis.  Abdomen soft, nondistended with tenderness in the epigastric region.  No rebound or  guarding.  Low suspicion for acute abdomen.  Will obtain routine labs and lipase to rule out pancreatitis, infectious signs, and electrolyte abnormalities. COVID/Influenza test. Toradol given for headache. GI cocktail for epigastric burning. Suspect symptoms related to possible COVID infection. Epigastric burning likely due to reflux.  Lipase normal at 21.  Doubt pancreatitis.  COVID/influenza negative. Symptoms most likely related to another viral etiology. CBC reassuring with no leukocytosis and normal hemoglobin.  CMP significant for mild hypokalemia at 3.4, but otherwise reassuring.  No major electrolyte derangements.  EKG personally reviewed which demonstrates normal sinus rhythm with no signs of acute ischemia.  Patient admits to mild symptom improvement after Protonix.  Suspect epigastric pain related to reflux.  Instructed patient to continue taking reflux medication as prescribed.  Follow-up with PCP within the next week for further evaluation. Strict ED precautions discussed with patient. Patient states understanding and agrees to plan. Patient discharged home in no acute distress and stable vitals  Final Clinical Impression(s) / ED Diagnoses Final diagnoses:  Cough  Epigastric pain    Rx / DC Orders ED Discharge Orders    None       Jesusita Oka 06/22/20 1503    Milagros Loll, MD 06/24/20 [phone]

## 2021-04-01 ENCOUNTER — Other Ambulatory Visit: Payer: Self-pay | Admitting: Orthopedic Surgery

## 2021-04-11 ENCOUNTER — Encounter (HOSPITAL_BASED_OUTPATIENT_CLINIC_OR_DEPARTMENT_OTHER): Payer: Self-pay | Admitting: Orthopedic Surgery

## 2021-04-11 ENCOUNTER — Other Ambulatory Visit: Payer: Self-pay

## 2021-04-12 ENCOUNTER — Encounter (HOSPITAL_BASED_OUTPATIENT_CLINIC_OR_DEPARTMENT_OTHER)
Admission: RE | Admit: 2021-04-12 | Discharge: 2021-04-12 | Disposition: A | Discharge status: Home / Self Care | Payer: 59 | Source: Ambulatory Visit | Attending: Orthopedic Surgery | Admitting: Orthopedic Surgery

## 2021-04-12 DIAGNOSIS — Z01812 Encounter for preprocedural laboratory examination: Secondary | ICD-10-CM | POA: Insufficient documentation

## 2021-04-12 LAB — BASIC METABOLIC PANEL
Anion gap: 5 (ref 5–15)
BUN: 17 mg/dL (ref 6–20)
CO2: 31 mmol/L (ref 22–32)
Calcium: 9.5 mg/dL (ref 8.9–10.3)
Chloride: 102 mmol/L (ref 98–111)
Creatinine, Ser: 0.68 mg/dL (ref 0.44–1.00)
GFR, Estimated: >60 mL/min (ref >60)
Glucose, Bld: 99 mg/dL (ref 70–99)
Potassium: 4.3 mmol/L (ref 3.5–5.1)
Sodium: 138 mmol/L (ref 135–145)

## 2021-04-14 NOTE — H&P (Signed)
Primary Care Provider: None Referring Provider: Dr. Vilinda Blanks Comp: No Date of Injury or Onset: Chronic  History: CC / Reason for Visit: Bilateral carpal tunnel syndrome; second MCP pain HPI: This patient returns to clinic today for reevaluation.  She reports that her symptoms are as bad as ever, and affect her sleep nearly every evening.  She confirms her desire to proceed surgically at this point.    HPI 02/15/21:This patient returns reevaluation, indicating that her work demands make having time off for surgery difficult.  She did well following the injections, and really bothered mostly by index MCP pain at present.  She would like another injection if possible.  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  She states that she has had injections, utilizes nighttime splints, and has nerve conduction studies confirming her carpal tunnel syndrome.  Additionally, she has pain in the right index finger MCP specifically on the radial aspect which she describes as a burning pain.  She has had an injection as well which she indicated was very helpful for quite some time in alleviating the discomfort.  When she saw Dr. Althea Charon last in February, he recommended that she follow up with Dr. Janee Morn to discuss surgical intervention.  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  She is hoping for an injection in her MCP as well as her right carpal tunnel today.  Review of systems as related to current complaint reviewed and unchanged.  Exam:  Vitals: Refer to EMR. Constitutional:  WD, WN, NAD HEENT:  NCAT, EOMI Neuro/Psych:  Alert & oriented to person, place, and time; appropriate mood & affect Lymphatic: No generalized UE edema or lymphadenopathy Extremities / MSK:  Both UE are normal with  respect to appearance, ranges of motion, joint stability, muscle strength/tone, sensation, & perfusion except as otherwise noted:  Bilateral hands have full digital range of motion.  On the right side, there is thenar wasting.  Light touch sensibility is intact into each digital fingertip, but altered.  There is no current Tinel sign.  The left side is normal in appearance with full digital range of motion and mild thenar wasting.  Monofilaments: 2.83 all digits.  Labs / X-rays:  No radiographic studies obtained today.  Assessment: 1.  Right carpal tunnel syndrome more symptomatic than left 2.  Left carpal tunnel syndrome 3.  Right second MCP pain--injected 11-30-20 & 02-15-21  Plan:  I discussed today's findings with her.  She wishes to move forward with right endoscopic carpal tunnel release.  We will plan to proceed on Monday, 04-18-21 at [facility].  The details of the operative procedure were discussed with the patient.  Questions were invited and answered.  In addition to the goal of the procedure, the risks of the procedure to include but not limited to bleeding; infection; damage to the nerves or blood vessels that could result in bleeding, numbness, weakness, chronic pain, and the need for additional procedures; stiffness; the need for revision surgery; and anesthetic risks were reviewed.  No specific outcome was guaranteed or implied.     Work status: All interested parties should please consider this patient to be out of work entirely if no work is available that complies with the restrictions detailed below.  If these restrictions result in the patient being  out of work entirely, the employer is expected to provide documentation of such to all interested third parties such as disability insurance companies:    As tolerated, without restrictions.  Electronically verified by:  Cliffton Asters Janee Morn, MD

## 2021-04-18 ENCOUNTER — Other Ambulatory Visit: Payer: Self-pay

## 2021-04-18 ENCOUNTER — Ambulatory Visit (HOSPITAL_BASED_OUTPATIENT_CLINIC_OR_DEPARTMENT_OTHER): Payer: 59 | Admitting: Certified Registered Nurse Anesthetist

## 2021-04-18 ENCOUNTER — Ambulatory Visit (HOSPITAL_BASED_OUTPATIENT_CLINIC_OR_DEPARTMENT_OTHER)
Admission: RE | Admit: 2021-04-18 | Discharge: 2021-04-18 | Disposition: A | Discharge status: Home / Self Care | Payer: 59 | Attending: Orthopedic Surgery | Admitting: Orthopedic Surgery

## 2021-04-18 ENCOUNTER — Encounter (HOSPITAL_BASED_OUTPATIENT_CLINIC_OR_DEPARTMENT_OTHER): Payer: Self-pay | Admitting: Orthopedic Surgery

## 2021-04-18 ENCOUNTER — Encounter (HOSPITAL_BASED_OUTPATIENT_CLINIC_OR_DEPARTMENT_OTHER)
Admission: RE | Disposition: A | Discharge status: Home / Self Care | Payer: Self-pay | Source: Home / Self Care | Attending: Orthopedic Surgery

## 2021-04-18 DIAGNOSIS — G5603 Carpal tunnel syndrome, bilateral upper limbs: Secondary | ICD-10-CM | POA: Diagnosis present

## 2021-04-18 HISTORY — DX: Gastro-esophageal reflux disease without esophagitis: K21.9

## 2021-04-18 HISTORY — PX: CARPAL TUNNEL RELEASE: SHX101

## 2021-04-18 SURGERY — RELEASE, CARPAL TUNNEL, ENDOSCOPIC
Anesthesia: Monitor Anesthesia Care | Site: Hand | Laterality: Right

## 2021-04-18 MED ORDER — BUPIVACAINE-EPINEPHRINE (PF) 0.5% -1:200000 IJ SOLN
INTRAMUSCULAR | Status: DC | PRN
  Administered 2021-04-18: 8 mL via PERINEURAL

## 2021-04-18 MED ORDER — EPINEPHRINE PF 1 MG/ML IJ SOLN
INTRAMUSCULAR | Status: AC
  Filled 2021-04-18: qty 2

## 2021-04-18 MED ORDER — BACITRACIN ZINC 500 UNIT/GM EX OINT
TOPICAL_OINTMENT | CUTANEOUS | Status: AC
  Filled 2021-04-18: qty 56.7

## 2021-04-18 MED ORDER — ACETAMINOPHEN 325 MG PO TABS: 650.0000 mg | ORAL_TABLET | Freq: Four times a day (QID) | ORAL | Status: DC

## 2021-04-18 MED ORDER — ONDANSETRON HCL 4 MG/2ML IJ SOLN
INTRAMUSCULAR | Status: AC
  Filled 2021-04-18: qty 2

## 2021-04-18 MED ORDER — LIDOCAINE HCL 2 % IJ SOLN
INTRAMUSCULAR | Status: DC | PRN
  Administered 2021-04-18: 8 mL

## 2021-04-18 MED ORDER — LACTATED RINGERS IV SOLN: INTRAVENOUS | Status: DC

## 2021-04-18 MED ORDER — FENTANYL CITRATE (PF) 100 MCG/2ML IJ SOLN
INTRAMUSCULAR | Status: DC | PRN
  Administered 2021-04-18 (×2): 50 ug via INTRAVENOUS

## 2021-04-18 MED ORDER — BUPIVACAINE HCL (PF) 0.25 % IJ SOLN
INTRAMUSCULAR | Status: AC
  Filled 2021-04-18: qty 30

## 2021-04-18 MED ORDER — IBUPROFEN 200 MG PO TABS: 600.0000 mg | ORAL_TABLET | Freq: Four times a day (QID) | ORAL | Status: DC

## 2021-04-18 MED ORDER — LIDOCAINE-EPINEPHRINE 1 %-1:100000 IJ SOLN
INTRAMUSCULAR | Status: AC
  Filled 2021-04-18: qty 2

## 2021-04-18 MED ORDER — CEFAZOLIN SODIUM-DEXTROSE 2-4 GM/100ML-% IV SOLN
INTRAVENOUS | Status: AC
  Filled 2021-04-18: qty 100

## 2021-04-18 MED ORDER — ONDANSETRON HCL 4 MG/2ML IJ SOLN
INTRAMUSCULAR | Status: DC | PRN
  Administered 2021-04-18: 4 mg via INTRAVENOUS

## 2021-04-18 MED ORDER — CIPROFLOXACIN-DEXAMETHASONE 0.3-0.1 % OT SUSP
OTIC | Status: AC
  Filled 2021-04-18: qty 7.5

## 2021-04-18 MED ORDER — OXYCODONE HCL 5 MG PO TABS: 5.0000 mg | ORAL_TABLET | Freq: Four times a day (QID) | ORAL | 0 refills | Status: DC | PRN

## 2021-04-18 MED ORDER — MIDAZOLAM HCL 2 MG/2ML IJ SOLN
INTRAMUSCULAR | Status: AC
  Filled 2021-04-18: qty 2

## 2021-04-18 MED ORDER — LIDOCAINE HCL 2 % IJ SOLN
INTRAMUSCULAR | Status: AC
  Filled 2021-04-18: qty 20

## 2021-04-18 MED ORDER — PROPOFOL 10 MG/ML IV BOLUS
INTRAVENOUS | Status: DC | PRN
  Administered 2021-04-18 (×6): 10 mg via INTRAVENOUS
  Administered 2021-04-18 (×2): 20 mg via INTRAVENOUS
  Administered 2021-04-18: 10 mg via INTRAVENOUS

## 2021-04-18 MED ORDER — FENTANYL CITRATE (PF) 100 MCG/2ML IJ SOLN
INTRAMUSCULAR | Status: AC
  Filled 2021-04-18: qty 2

## 2021-04-18 MED ORDER — CEFAZOLIN SODIUM-DEXTROSE 2-4 GM/100ML-% IV SOLN
2.0000 g | INTRAVENOUS | Status: AC
Start: 2021-04-18 — End: 2021-04-18
  Administered 2021-04-18: 2 g via INTRAVENOUS

## 2021-04-18 MED ORDER — MIDAZOLAM HCL 5 MG/5ML IJ SOLN
INTRAMUSCULAR | Status: DC | PRN
  Administered 2021-04-18: 2 mg via INTRAVENOUS

## 2021-04-18 MED ORDER — METHYLENE BLUE 0.5 % INJ SOLN
INTRAVENOUS | Status: AC
  Filled 2021-04-18: qty 10

## 2021-04-18 MED ORDER — LIDOCAINE HCL (CARDIAC) PF 100 MG/5ML IV SOSY
PREFILLED_SYRINGE | INTRAVENOUS | Status: DC | PRN
  Administered 2021-04-18: 50 mg via INTRAVENOUS

## 2021-04-18 MED ORDER — BUPIVACAINE-EPINEPHRINE (PF) 0.5% -1:200000 IJ SOLN
INTRAMUSCULAR | Status: AC
  Filled 2021-04-18: qty 30

## 2021-04-18 MED ORDER — LIDOCAINE 2% (20 MG/ML) 5 ML SYRINGE
INTRAMUSCULAR | Status: AC
  Filled 2021-04-18: qty 5

## 2021-04-18 MED ORDER — OXYMETAZOLINE HCL 0.05 % NA SOLN
NASAL | Status: AC
  Filled 2021-04-18: qty 30

## 2021-04-18 MED ORDER — PROPOFOL 10 MG/ML IV BOLUS
INTRAVENOUS | Status: AC
  Filled 2021-04-18: qty 40

## 2021-04-18 SURGICAL SUPPLY — 44 items
APL PRP STRL LF DISP 70% ISPRP (MISCELLANEOUS) ×1
APL SWBSTK 6 STRL LF DISP (MISCELLANEOUS) ×2
APPLICATOR COTTON TIP 6 STRL (MISCELLANEOUS) ×1 IMPLANT
APPLICATOR COTTON TIP 6IN STRL (MISCELLANEOUS) ×4
BLADE HOOK ENDO STRL (BLADE) ×2 IMPLANT
BLADE SURG 15 STRL LF DISP TIS (BLADE) ×1 IMPLANT
BLADE SURG 15 STRL SS (BLADE) ×2
BLADE TRIANGLE EPF/EGR ENDO (BLADE) ×2 IMPLANT
BNDG CMPR 9X4 STRL LF SNTH (GAUZE/BANDAGES/DRESSINGS) ×1
BNDG COHESIVE 4X5 TAN ST LF (GAUZE/BANDAGES/DRESSINGS) ×2 IMPLANT
BNDG ESMARK 4X9 LF (GAUZE/BANDAGES/DRESSINGS) ×2 IMPLANT
BNDG GAUZE ELAST 4 BULKY (GAUZE/BANDAGES/DRESSINGS) ×2 IMPLANT
CHLORAPREP W/TINT 26 (MISCELLANEOUS) ×2 IMPLANT
CORD BIPOLAR FORCEPS 12FT (ELECTRODE) IMPLANT
COVER BACK TABLE 60X90IN (DRAPES) ×2 IMPLANT
COVER MAYO STAND STRL (DRAPES) ×2 IMPLANT
CUFF TOURN SGL QUICK 18X4 (TOURNIQUET CUFF) IMPLANT
DRAPE EXTREMITY T 121X128X90 (DISPOSABLE) ×2 IMPLANT
DRAPE SURG 17X23 STRL (DRAPES) ×1 IMPLANT
DRSG EMULSION OIL 3X3 NADH (GAUZE/BANDAGES/DRESSINGS) ×2 IMPLANT
GAUZE 4X4 16PLY ~~LOC~~+RFID DBL (SPONGE) ×1 IMPLANT
GAUZE SPONGE 4X4 12PLY STRL LF (GAUZE/BANDAGES/DRESSINGS) ×2 IMPLANT
GLOVE SRG 8 PF TXTR STRL LF DI (GLOVE) ×1 IMPLANT
GLOVE SURG ENC MOIS LTX SZ7.5 (GLOVE) ×2 IMPLANT
GLOVE SURG LTX SZ6.5 (GLOVE) ×2 IMPLANT
GLOVE SURG POLYISO LF SZ6.5 (GLOVE) ×1 IMPLANT
GLOVE SURG UNDER POLY LF SZ7 (GLOVE) ×3 IMPLANT
GLOVE SURG UNDER POLY LF SZ8 (GLOVE) ×2
GOWN STRL REUS W/ TWL LRG LVL3 (GOWN DISPOSABLE) ×2 IMPLANT
GOWN STRL REUS W/TWL LRG LVL3 (GOWN DISPOSABLE) ×4
GOWN STRL REUS W/TWL XL LVL3 (GOWN DISPOSABLE) ×2 IMPLANT
NDL HYPO 25X1 1.5 SAFETY (NEEDLE) IMPLANT
NEEDLE HYPO 25X1 1.5 SAFETY (NEEDLE) ×2 IMPLANT
NS IRRIG 1000ML POUR BTL (IV SOLUTION) ×2 IMPLANT
PACK BASIN DAY SURGERY FS (CUSTOM PROCEDURE TRAY) ×2 IMPLANT
PADDING CAST ABS 4INX4YD NS (CAST SUPPLIES) ×1
PADDING CAST ABS COTTON 4X4 ST (CAST SUPPLIES) IMPLANT
STOCKINETTE 6  STRL (DRAPES) ×2
STOCKINETTE 6 STRL (DRAPES) ×1 IMPLANT
SUT VICRYL RAPIDE 4-0 (SUTURE) ×2 IMPLANT
SYR 10ML LL (SYRINGE) ×1 IMPLANT
SYR BULB EAR ULCER 3OZ GRN STR (SYRINGE) ×2 IMPLANT
TOWEL GREEN STERILE FF (TOWEL DISPOSABLE) ×4 IMPLANT
UNDERPAD 30X36 HEAVY ABSORB (UNDERPADS AND DIAPERS) ×2 IMPLANT

## 2021-04-18 NOTE — Anesthesia Preprocedure Evaluation (Signed)
Anesthesia Evaluation  Patient identified by MRN, date of birth, ID band Patient awake    Reviewed: Allergy & Precautions, NPO status , Patient's Chart, lab work & pertinent test results  Airway Mallampati: II  TM Distance: >3 FB Neck ROM: Full    Dental no notable dental hx.    Pulmonary neg pulmonary ROS,    Pulmonary exam normal breath sounds clear to auscultation       Cardiovascular hypertension, Pt. on medications negative cardio ROS Normal cardiovascular exam Rhythm:Regular Rate:Normal     Neuro/Psych  Headaches, Anxiety Depression Bipolar Disorder negative psych ROS   GI/Hepatic Neg liver ROS, GERD  ,  Endo/Other  negative endocrine ROS  Renal/GU negative Renal ROS  negative genitourinary   Musculoskeletal negative musculoskeletal ROS (+)   Abdominal (+) + obese,   Peds negative pediatric ROS (+)  Hematology negative hematology ROS (+)   Anesthesia Other Findings   Reproductive/Obstetrics negative OB ROS                             Anesthesia Physical Anesthesia Plan  ASA: 2  Anesthesia Plan: MAC   Post-op Pain Management:    Induction: Intravenous  PONV Risk Score and Plan: 2 and Ondansetron, Midazolam and Treatment may vary due to age or medical condition  Airway Management Planned: Simple Face Mask  Additional Equipment:   Intra-op Plan:   Post-operative Plan:   Informed Consent: I have reviewed the patients History and Physical, chart, labs and discussed the procedure including the risks, benefits and alternatives for the proposed anesthesia with the patient or authorized representative who has indicated his/her understanding and acceptance.     Dental advisory given  Plan Discussed with: CRNA  Anesthesia Plan Comments:         Anesthesia Quick Evaluation

## 2021-04-18 NOTE — Op Note (Signed)
04/18/2021  7:07 AM  PATIENT:  Amanda White  55 y.o. female  PRE-OPERATIVE DIAGNOSIS:  RIGHT CARPAL TUNNEL SYNDROME  POST-OPERATIVE DIAGNOSIS:  Same  PROCEDURE:  Procedure(s): RIGHT ENDOSCOPIC CARPAL TUNNEL RELEASE  SURGEON:  Surgeon(s): Milly Jakob, MD  PHYSICIAN ASSISTANT: Morley Kos, OPA-C  ANESTHESIA:  Local / MAC  SPECIMENS:  None  DRAINS:   TLS x 1  EBL:  less than 50 mL  PREOPERATIVE INDICATIONS:  Amanda White is a  55 y.o. female with a diagnosis of RIGHT CARPAL TUNNEL SYNDROME who failed conservative measures and elected for surgical management.    The risks benefits and alternatives were discussed with the patient preoperatively including but not limited to the risks of infection, bleeding, nerve injury, cardiopulmonary complications, the need for revision surgery, among others, and the patient verbalized understanding and consented to proceed.  OPERATIVE IMPLANTS: None  OPERATIVE FINDINGS: Tight carpal tunnel, acceptably decompressed following release  OPERATIVE PROCEDURE:  After receiving prophylactic antibiotics, the patient was escorted to the operative theatre and placed in a supine position.   A surgical "time-out" was performed during which the planned procedure, proposed operative site, and the correct patient identity were compared to the operative consent and agreement confirmed by the circulating nurse according to current facility policy.  Following application of a tourniquet to the operative extremity, the planned incisions were marked and anesthetized with a mixture of lidocaine & bupivicaine containing epinephrine.  The exposed skin was prepped with Chloraprep and draped in the usual sterile fashion.  The limb was exsanguinated with an Esmarch bandage and the tourniquet inflated to approximately 116mHg higher than systolic BP.   The incisions were made sharply. Subcutaneous tissues were dissected with blunt and spreading dissection.  At the proximal incision, the deep forearm fascia was split in line with the skin incision the distal edge grasped with a hemostat. At the mid palmar incision, the palmar fascia was split in line with the skin incision, revealing the underlying superficial palmar arch which was visualized with loupe assisted magnification. The synovial reflector was then introduced into the proximal incision and passed through the carpal canal, uses to reflect synovium from the deep surface of the transverse carpal ligament. It was removed and replaced with the slotted cannula and blunt obturator, passing from proximal to distal and exiting the distal wound superficial to the superficial palmar arch. The obturator was removed and the camera inserted. Visualization of the ligament was acceptable. The triangle shape blade was then inserted distally, advanced to the midportion of the ligament, and there used to create a perforation in the ligament. This instrument was removed and the hooked nstrument was inserted. It was placed into the perforation in the ligament and withdrawn distally, completing transection of the distal half of the ligament. The camera was then removed and placed into the distal end of the cannula. The hooked instrument was placed into the proximal end and advanced facility to be placed into the apex of the V. which had been formed to the distal hemi-transection of the ligament. It was withdrawn proximally, completing transection of the ligament. The adequacy of the release was judged with the scope and the instruments used as a probe. All of the endoscopic instruments are removed and the adequacy of release was again judged from the proximal incision perspective with direct loupe assisted visualization. In addition, the proximal forearm fascia was split for 2 inches proximal to the proximal incision under direct visualization using a sliding scissor technique. The  tourniquet was released, the wound copiously  irrigated.  The skin was then closed with 4-0 Vicryl Rapide interrupted sutures. A light dressing was applied and she was transported to the PACU.  DISPOSITION:  The patient will be discharged home today, returning in 10-15 days for re-assessment.

## 2021-04-18 NOTE — Discharge Instructions (Addendum)
Discharge Instructions   You have a light dressing on your hand.  You may begin gentle motion of your fingers and hand immediately, but you should not do any heavy lifting or gripping.  Elevate your hand to reduce pain & swelling of the digits.  Ice over the operative site may be helpful to reduce pain & swelling.  DO NOT USE HEAT. Pain medicine has been prescribed for you.  Take Tylenol 650 mg and ibuprofen 600 mg every 6 hours. Take Oxycodone 5 mg as a rescue medicine for severe surgical pain. Leave the dressing in place until the third day after your surgery and then remove it, leaving it open to air.  After the bandage has been removed you may shower, regularly washing the incision and letting the water run over it, but not submerging it (no swimming, soaking it in dishwater, etc.) You may drive a car when you are off of prescription pain medications and can safely control your vehicle with both hands. We will address whether therapy will be required or not when you return to the office. You may have already made your follow-up appointment when we completed your preop visit.  If not, please call our office today or the next business day to make your return appointment for 10-15 days after surgery.   Please call [phone] during normal business hours or [phone] after hours for any problems. Including the following:  - excessive redness of the incisions - drainage for more than 4 days - fever of more than 101.5 F  *Please note that pain medications will not be refilled after hours or on weekends.   Work Status: No restrictions, as tolerated.    Post Anesthesia Home Care Instructions  Activity: Get plenty of rest for the remainder of the day. A responsible individual must stay with you for 24 hours following the procedure.  For the next 24 hours, DO NOT: -Drive a car -Advertising copywriter -Drink alcoholic beverages -Take any medication unless instructed by your  physician -Make any legal decisions or sign important papers.  Meals: Start with liquid foods such as gelatin or soup. Progress to regular foods as tolerated. Avoid greasy, spicy, heavy foods. If nausea and/or vomiting occur, drink only clear liquids until the nausea and/or vomiting subsides. Call your physician if vomiting continues.  Special Instructions/Symptoms: Your throat may feel dry or sore from the anesthesia or the breathing tube placed in your throat during surgery. If this causes discomfort, gargle with warm salt water. The discomfort should disappear within 24 hours.  If you had a scopolamine patch placed behind your ear for the management of post- operative nausea and/or vomiting:  1. The medication in the patch is effective for 72 hours, after which it should be removed.  Wrap patch in a tissue and discard in the trash. Wash hands thoroughly with soap and water. 2. You may remove the patch earlier than 72 hours if you experience unpleasant side effects which may include dry mouth, dizziness or visual disturbances. 3. Avoid touching the patch. Wash your hands with soap and water after contact with the patch.

## 2021-04-18 NOTE — Interval H&P Note (Signed)
History and Physical Interval Note:  04/18/2021 7:07 AM  Amanda White  has presented today for surgery, with the diagnosis of RIGHT CARPAL TUNNEL SYNDROME.  The various methods of treatment have been discussed with the patient and family. After consideration of risks, benefits and other options for treatment, the patient has consented to  Procedure(s) with comments: RIGHT ENDOSCOPIC CARPAL TUNNEL RELEASE (Right) - LENTH OF SURGERY: 30 MINUTES as a surgical intervention.  The patient's history has been reviewed, patient examined, no change in status, stable for surgery.  I have reviewed the patient's chart and labs.  Questions were answered to the patient's satisfaction.     [name]

## 2021-04-18 NOTE — Transfer of Care (Signed)
Immediate Anesthesia Transfer of Care Note  Patient: Amanda White  Procedure(s) Performed: RIGHT ENDOSCOPIC CARPAL TUNNEL RELEASE (Right: Hand)  Patient Location: PACU  Anesthesia Type:MAC  Level of Consciousness: awake, alert  and oriented  Airway & Oxygen Therapy: Patient Spontanous Breathing  Post-op Assessment: Report given to RN and Post -op Vital signs reviewed and stable  Post vital signs: Reviewed and stable  Last Vitals:  Vitals Value Taken Time  BP 113/73 04/18/21 0816  Temp 36.6 C 04/18/21 0816  Pulse 93 04/18/21 0818  Resp 20 04/18/21 0818  SpO2 97 % 04/18/21 0818  Vitals shown include unvalidated device data.  Last Pain:  Vitals:   04/18/21 1224  TempSrc: Oral  PainSc: 0-No pain         Complications: No notable events documented.

## 2021-04-18 NOTE — Anesthesia Postprocedure Evaluation (Signed)
Anesthesia Post Note  Patient: Amanda White  Procedure(s) Performed: RIGHT ENDOSCOPIC CARPAL TUNNEL RELEASE (Right: Hand)     Patient location during evaluation: PACU Anesthesia Type: MAC Level of consciousness: awake and alert Pain management: pain level controlled Vital Signs Assessment: post-procedure vital signs reviewed and stable Respiratory status: spontaneous breathing, nonlabored ventilation and respiratory function stable Cardiovascular status: blood pressure returned to baseline and stable Postop Assessment: no apparent nausea or vomiting Anesthetic complications: no   No notable events documented.  Last Vitals:  Vitals:   04/18/21 0836 04/18/21 0914  BP: 125/71 129/71  Pulse: 86 82  Resp: 15 20  Temp:  (!) 36.4 C  SpO2: 95% 96%    Last Pain:  Vitals:   04/18/21 0914  TempSrc: Oral  PainSc: 0-No pain                 Lynda Rainwater

## 2021-04-19 ENCOUNTER — Encounter (HOSPITAL_BASED_OUTPATIENT_CLINIC_OR_DEPARTMENT_OTHER): Payer: Self-pay | Admitting: Orthopedic Surgery

## 2021-05-10 LAB — HM COLONOSCOPY

## 2021-05-25 LAB — HM COLONOSCOPY

## 2021-08-08 ENCOUNTER — Other Ambulatory Visit: Payer: Self-pay | Admitting: Nurse Practitioner

## 2021-08-08 DIAGNOSIS — Z780 Asymptomatic menopausal state: Secondary | ICD-10-CM

## 2022-05-22 ENCOUNTER — Other Ambulatory Visit: Payer: Self-pay | Admitting: Family Medicine

## 2022-05-22 DIAGNOSIS — R7989 Other specified abnormal findings of blood chemistry: Secondary | ICD-10-CM

## 2022-06-14 ENCOUNTER — Other Ambulatory Visit: Payer: Self-pay

## 2022-07-03 ENCOUNTER — Other Ambulatory Visit: Payer: Self-pay

## 2022-07-19 ENCOUNTER — Ambulatory Visit
Admission: RE | Admit: 2022-07-19 | Discharge: 2022-07-19 | Disposition: A | Discharge status: Home / Self Care | Payer: Commercial Managed Care - HMO | Source: Ambulatory Visit | Attending: Family Medicine | Admitting: Family Medicine

## 2022-07-19 DIAGNOSIS — R7989 Other specified abnormal findings of blood chemistry: Secondary | ICD-10-CM

## 2022-07-20 ENCOUNTER — Other Ambulatory Visit (HOSPITAL_COMMUNITY): Payer: Self-pay | Admitting: Family Medicine

## 2022-07-20 DIAGNOSIS — R160 Hepatomegaly, not elsewhere classified: Secondary | ICD-10-CM

## 2022-07-24 ENCOUNTER — Ambulatory Visit (HOSPITAL_COMMUNITY)
Admission: RE | Admit: 2022-07-24 | Discharge: 2022-07-24 | Disposition: A | Discharge status: Home / Self Care | Payer: Commercial Managed Care - HMO | Source: Ambulatory Visit | Attending: Family Medicine | Admitting: Family Medicine

## 2022-07-24 DIAGNOSIS — R160 Hepatomegaly, not elsewhere classified: Secondary | ICD-10-CM | POA: Diagnosis present

## 2022-07-24 MED ORDER — GADOBUTROL 1 MMOL/ML IV SOLN
8.0000 mL | Freq: Once | INTRAVENOUS | Status: AC | PRN
  Administered 2022-07-24: 8 mL via INTRAVENOUS

## 2022-08-25 DIAGNOSIS — Z419 Encounter for procedure for purposes other than remedying health state, unspecified: Secondary | ICD-10-CM | POA: Diagnosis not present

## 2022-09-06 DIAGNOSIS — R1013 Epigastric pain: Secondary | ICD-10-CM | POA: Diagnosis not present

## 2022-09-06 DIAGNOSIS — R42 Dizziness and giddiness: Secondary | ICD-10-CM | POA: Diagnosis not present

## 2022-09-06 DIAGNOSIS — N939 Abnormal uterine and vaginal bleeding, unspecified: Secondary | ICD-10-CM | POA: Diagnosis not present

## 2022-09-06 DIAGNOSIS — I10 Essential (primary) hypertension: Secondary | ICD-10-CM | POA: Diagnosis not present

## 2022-09-06 DIAGNOSIS — Z79899 Other long term (current) drug therapy: Secondary | ICD-10-CM | POA: Diagnosis not present

## 2022-09-06 DIAGNOSIS — Z20822 Contact with and (suspected) exposure to covid-19: Secondary | ICD-10-CM | POA: Diagnosis not present

## 2022-09-25 ENCOUNTER — Encounter: Payer: Self-pay | Admitting: Internal Medicine

## 2022-09-25 ENCOUNTER — Ambulatory Visit (INDEPENDENT_AMBULATORY_CARE_PROVIDER_SITE_OTHER): Payer: Medicaid Other | Admitting: Internal Medicine

## 2022-09-25 VITALS — BP 130/78 | HR 80 | Temp 98.4°F | Ht 63.5 in | Wt 129.2 lb

## 2022-09-25 DIAGNOSIS — E785 Hyperlipidemia, unspecified: Secondary | ICD-10-CM

## 2022-09-25 DIAGNOSIS — R229 Localized swelling, mass and lump, unspecified: Secondary | ICD-10-CM

## 2022-09-25 DIAGNOSIS — M5440 Lumbago with sciatica, unspecified side: Secondary | ICD-10-CM | POA: Diagnosis not present

## 2022-09-25 DIAGNOSIS — M109 Gout, unspecified: Secondary | ICD-10-CM

## 2022-09-25 DIAGNOSIS — G5601 Carpal tunnel syndrome, right upper limb: Secondary | ICD-10-CM | POA: Diagnosis not present

## 2022-09-25 DIAGNOSIS — R110 Nausea: Secondary | ICD-10-CM

## 2022-09-25 DIAGNOSIS — F411 Generalized anxiety disorder: Secondary | ICD-10-CM | POA: Diagnosis not present

## 2022-09-25 DIAGNOSIS — R634 Abnormal weight loss: Secondary | ICD-10-CM | POA: Insufficient documentation

## 2022-09-25 DIAGNOSIS — I10 Essential (primary) hypertension: Secondary | ICD-10-CM | POA: Diagnosis not present

## 2022-09-25 DIAGNOSIS — R413 Other amnesia: Secondary | ICD-10-CM

## 2022-09-25 DIAGNOSIS — Z119 Encounter for screening for infectious and parasitic diseases, unspecified: Secondary | ICD-10-CM | POA: Diagnosis not present

## 2022-09-25 DIAGNOSIS — K219 Gastro-esophageal reflux disease without esophagitis: Secondary | ICD-10-CM

## 2022-09-25 DIAGNOSIS — F1320 Sedative, hypnotic or anxiolytic dependence, uncomplicated: Secondary | ICD-10-CM

## 2022-09-25 DIAGNOSIS — Q446 Cystic disease of liver: Secondary | ICD-10-CM

## 2022-09-25 DIAGNOSIS — R5383 Other fatigue: Secondary | ICD-10-CM

## 2022-09-25 DIAGNOSIS — R10811 Right upper quadrant abdominal tenderness: Secondary | ICD-10-CM

## 2022-09-25 DIAGNOSIS — Z419 Encounter for procedure for purposes other than remedying health state, unspecified: Secondary | ICD-10-CM | POA: Diagnosis not present

## 2022-09-25 DIAGNOSIS — G5600 Carpal tunnel syndrome, unspecified upper limb: Secondary | ICD-10-CM

## 2022-09-25 HISTORY — DX: Abnormal weight loss: R63.4

## 2022-09-25 HISTORY — DX: Localized swelling, mass and lump, unspecified: R22.9

## 2022-09-25 HISTORY — DX: Carpal tunnel syndrome, unspecified upper limb: G56.00

## 2022-09-25 LAB — CBC
HCT: 37.1 % (ref 36.0–46.0)
Hemoglobin: 13 g/dL (ref 12.0–15.0)
MCHC: 34.9 g/dL (ref 30.0–36.0)
MCV: 93.8 fl (ref 78.0–100.0)
Platelets: 423 10*3/uL — ABNORMAL HIGH (ref 150.0–400.0)
RBC: 3.96 Mil/uL (ref 3.87–5.11)
RDW: 13 % (ref 11.5–15.5)
WBC: 7.2 10*3/uL (ref 4.0–10.5)

## 2022-09-25 LAB — LIPID PANEL
Cholesterol: 221 mg/dL — ABNORMAL HIGH (ref 0–200)
HDL: 76.5 mg/dL (ref >39.00)
LDL Cholesterol: 124 mg/dL — ABNORMAL HIGH (ref 0–99)
NonHDL: 144.8
Total CHOL/HDL Ratio: 3
Triglycerides: 102 mg/dL (ref 0.0–149.0)
VLDL: 20.4 mg/dL (ref 0.0–40.0)

## 2022-09-25 LAB — COMPREHENSIVE METABOLIC PANEL
ALT: 99 U/L — ABNORMAL HIGH (ref 0–35)
AST: 40 U/L — ABNORMAL HIGH (ref 0–37)
Albumin: 4.9 g/dL (ref 3.5–5.2)
Alkaline Phosphatase: 47 U/L (ref 39–117)
BUN: 17 mg/dL (ref 6–23)
CO2: 30 mEq/L (ref 19–32)
Calcium: 10.1 mg/dL (ref 8.4–10.5)
Chloride: 98 mEq/L (ref 96–112)
Creatinine, Ser: 0.64 mg/dL (ref 0.40–1.20)
GFR: 97.85 mL/min (ref >60.00)
Glucose, Bld: 96 mg/dL (ref 70–99)
Potassium: 3.6 mEq/L (ref 3.5–5.1)
Sodium: 138 mEq/L (ref 135–145)
Total Bilirubin: 0.7 mg/dL (ref 0.2–1.2)
Total Protein: 7.2 g/dL (ref 6.0–8.3)

## 2022-09-25 LAB — B12 AND FOLATE PANEL
Folate: 11.2 ng/mL (ref >5.9)
Vitamin B-12: 306 pg/mL (ref 211–911)

## 2022-09-25 LAB — VITAMIN D 25 HYDROXY (VIT D DEFICIENCY, FRACTURES): VITD: 26.68 ng/mL — ABNORMAL LOW (ref 30.00–100.00)

## 2022-09-25 MED ORDER — MIRTAZAPINE 7.5 MG PO TABS: 7.5000 mg | ORAL_TABLET | Freq: Every day | ORAL | 2 refills | Status: DC

## 2022-09-25 MED ORDER — ONDANSETRON 4 MG PO TBDP: 4.0000 mg | ORAL_TABLET | Freq: Three times a day (TID) | ORAL | 0 refills | Status: DC | PRN

## 2022-09-25 MED ORDER — PEPCID COMPLETE 10-800-165 MG PO CHEW: 1.0000 | CHEWABLE_TABLET | Freq: Every day | ORAL | 11 refills | Status: AC | PRN

## 2022-09-25 MED ORDER — ONDANSETRON 4 MG PO TBDP
4.0000 mg | ORAL_TABLET | Freq: Three times a day (TID) | ORAL | 0 refills | Status: DC | PRN
Start: 2022-09-25 — End: 2023-08-12

## 2022-09-25 MED ORDER — CYCLOBENZAPRINE HCL 10 MG PO TABS: 10.0000 mg | ORAL_TABLET | Freq: Every day | ORAL | 2 refills | Status: DC | PRN

## 2022-09-25 MED ORDER — ESOMEPRAZOLE MAGNESIUM 20 MG PO CPDR: 20.0000 mg | DELAYED_RELEASE_CAPSULE | Freq: Every day | ORAL | 2 refills | Status: DC

## 2022-09-25 NOTE — Assessment & Plan Note (Signed)
Check lipids encouraged continuing with current medication(s)

## 2022-09-25 NOTE — Assessment & Plan Note (Signed)
Not currently flaring, encouraged continuing with flexeril/gabapentin prn

## 2022-09-25 NOTE — Progress Notes (Signed)
[place]  Phone: [phone]  New patient visit  Visit Date: 09/25/2022 Patient: Amanda White   DOB: [birth year]   57 y.o. Female  MRN: [id number] PCP:  Loralee Pacas, MD  (establishing care today)  [place] Provider: Loralee Pacas, MD   Assessment and Plan:   This initial visit focused on establishing a primary care relationship and gathering a comprehensive medical history. We addressed key concerns and prioritized next  steps.    Physician Time-Spent:  78 minutes of total time was spent on the date of this encounter performing the following actions: chart review while seeing the patient, obtaining history, performing a medically necessary exam, counseling on the treatment plan, placing orders to coordinate referrals, and documenting in our EHR.   This time was independent of any separately billable procedure(s).  This extended time spent was medically necessary because  of the potential for a life-threatening malignancy to explain her complex multi-system symptomatic presentation - in order to optimize this visit and not waste time between visits we did a prolonged visit with additional artificial intelligence analysis components to develop a comprehensive diagnostic, referral, and follow up plan    Unintentional weight loss Assessment & Plan: Main concern(s) today- 50 pounds 1 year unintentional weight loss at age 65 is worrisome. Start Remeron to treat Get gastrointestinal and surgery input Massive lab workup Return to clinic just 1 month to re-evaluate  Artificial intelligence analysis completed doesn't feel like its giving me the right answer for the symptom(s).  Orders: -     Ambulatory referral to Gastroenterology -     Ambulatory referral to General Surgery -     DG Chest 2 View; Future -     Cyclic citrul peptide antibody, IgG -     Rheumatoid factor -     ANA -     VITAMIN D 25 Hydroxy (Vit-D Deficiency, Fractures) -     B12  and Folate Panel -     ANCA screen with reflex titer -     Thyroid Panel With TSH; Future  Screening examination for infectious disease -     Hepatitis C antibody -     HIV Antibody (routine testing w rflx)  Hyperlipidemia, unspecified hyperlipidemia type Assessment & Plan: Check lipids encouraged continuing with current medication(s)   Orders: -     Lipid panel  Benzodiazepine dependence Assessment & Plan: Agreed to continue Klonopin when it comes due although prefer taper  Need to first find out what's going on with weight loss Need to also try other medication(s) she hasn't been on she reports. Will start with Remeron for weight gain and help sleep with maybe half Klonopin.   Gastroesophageal reflux disease without esophagitis Assessment & Plan: add pepcid complete Have gastroenterologist evaluate   Orders: -     Esomeprazole Magnesium; Take 1 capsule (20 mg total) by mouth daily at 6 (six) AM.  Dispense: 30 capsule; Refill: 2 -     Pepcid Complete; Chew 1 tablet by mouth daily as needed.  Dispense: 100 tablet; Refill: 11 -     H. pylori antigen, stool  Lumbago of lumbar region with sciatica Assessment & Plan: Not currently flaring, encouraged continuing with flexeril/gabapentin prn  Orders: -     Cyclobenzaprine HCl; Take 1 tablet (10 mg total) by mouth daily as needed for muscle spasms.  Dispense: 30 tablet; Refill: 2  Podagra  Carpal tunnel syndrome of right wrist  Hypertension, unspecified type  Assessment & Plan: Stable well-controlled encouraged continuing with current meds  Orders: -     CBC -     Comprehensive metabolic panel  Generalized anxiety disorder -     Ambulatory referral to Psychiatry -     Ambulatory referral to Psychology  Memory impairment  Nausea -     Ondansetron; Take 1 tablet (4 mg total) by mouth every 8 (eight) hours as needed for nausea or vomiting.  Dispense: 20 tablet; Refill: 0  Subcutaneous nodules Assessment &  Plan: Stable, per patient  Will have surgeon evaluate for biopsy given the massive weight loss  Orders: -     Ambulatory referral to General Surgery -     DG Chest 2 View; Future  Other fatigue -     Ambulatory referral to Gastroenterology -     Hepatitis C antibody -     HIV Antibody (routine testing w rflx) -     Lipid panel -     CBC -     Comprehensive metabolic panel -     Ambulatory referral to Psychiatry -     Cyclobenzaprine HCl; Take 1 tablet (10 mg total) by mouth daily as needed for muscle spasms.  Dispense: 30 tablet; Refill: 2 -     Esomeprazole Magnesium; Take 1 capsule (20 mg total) by mouth daily at 6 (six) AM.  Dispense: 30 capsule; Refill: 2 -     Pepcid Complete; Chew 1 tablet by mouth daily as needed.  Dispense: 100 tablet; Refill: 11 -     Ambulatory referral to Psychology -     Mirtazapine; Take 1 tablet (7.5 mg total) by mouth at bedtime.  Dispense: 30 tablet; Refill: 2 -     Ambulatory referral to General Surgery -     DG Chest 2 View; Future -     Cyclic citrul peptide antibody, IgG -     Rheumatoid factor -     ANA -     VITAMIN D 25 Hydroxy (Vit-D Deficiency, Fractures) -     B12 and Folate Panel -     ANCA screen with reflex titer -     Thyroid Panel With TSH; Future -     Ondansetron; Take 1 tablet (4 mg total) by mouth every 8 (eight) hours as needed for nausea or vomiting.  Dispense: 20 tablet; Refill: 0  Right upper quadrant abdominal tenderness without rebound tenderness Assessment & Plan: Referring to gastroenterologist for this as well   Cystic disease of liver -     AFP tumor marker -     Hepatitis panel, acute -     Sedimentation rate -     C-reactive protein      Subjective:   Amanda White presents today with intent to establish care with Loralee Pacas, MD as her Primary Care Provider (PCP) going forward.   Her main concern(s) / chief complaint(s) are New Patient (Initial Visit) and Unintentional weight loss   HPI   Problem-oriented charting was used to develop and update her medical history: Problem  Hyperlipidemia  Benzodiazepine Dependence   Can't sleep without, attempts to taper are destabilizing, has been on at least 10 years   Amanda White (Gastroesophageal Reflux Disease)   Was on omeprazole which didn't work that well so changed to Nexium 2024 Carries ginger ale every where due to sudden flares, carbonated beverages curb it Prior referral not accepted by [facility], not certain if existing referral still happening.   Lumbago of Lumbar  Region With Sciatica   Intermittent flares/bouts- treated as needed with gabapentin   Podagra   R great toe, not diagnosed as gout, just arthritis   Hypertension   Current hypertension medications:       Sig   lisinopril-hydrochlorothiazide (PRINZIDE,ZESTORETIC) 10-12.5 MG tablet (Taking) Take 1 tablet by mouth daily.       Patient reports compliance with current medications and no significant side effect(s)  Home readings: only checks if hurting BP Readings from Last 3 Encounters:  09/25/22 130/78  04/18/21 129/71  06/22/20 (!) 153/87        Generalized Anxiety Disorder   Gets really nervous with arguing or with home purchasing Long term Klonopin Diagnosis uncertain "Trying to get some therapy now because I need it"    Memory Impairment   Associated with family history dementia and also dependence on clonazepam.    Subcutaneous Nodule   Suspect(s) lymphadenopathy cervical right and right flank   Unintentional Weight Loss   Wt Readings from Last 5 Encounters:  09/25/22 129 lb 3.2 oz (58.6 kg)  04/18/21 175 lb 11.3 oz (79.7 kg)  05/19/18 173 lb (78.5 kg)  50 pounds weight loss 1 year unintentional per patient at 09/25/22 appointment She reports poorly controlled gastroesophageal reflux disease despite Nexium, also chronic subcutaneous nodule R neck and right flank.  No pulmonary symptom(s). Right upper quadrant of abdomen pain with  hemorrhagic liver cysts on mri   Patients chart review and interview were used to generate a prompt for artificial intelligence analysis ([employer] artificial intelligence) clinical decision support.  AI output was reviewed and is provided in red: Comprehensive Review of the Case: A 57 year old female presents as a new patient with unintentional weight loss and chronic subcutaneous nodules on her right neck and mid-back. Her medical history is significant for hyperlipidemia, benzodiazepine dependence, GERD, lumbago with sciatica, podagra, hypertension, generalized anxiety disorder, memory impairment, and lymphadenopathy. She has a past medical history of anxiety, bipolar 1 disorder, carpal tunnel syndrome, depression, GERD, hypertension, and migraine. Her current medications include atorvastatin, clonazepam, cyclobenzaprine, esomeprazole, gabapentin, ibuprofen, lisinopril-hydrochlorothiazide, ondansetron, and promethazine. Laboratory findings reveal Na 138, K 4.3, Cl 102, CO2 31, glucose 17, creatinine 0.68, Ca 9.5, protein 6.9, albumin 4.1, AST 21, ALT 22, alkaline phosphatase 49, bilirubin 0.6, WBC 7.9, HGB 12.8, HCT 35.9, MCV 90.2, and platelets 315. MRI of the lower chest shows clear lung bases, no morphologic findings of cirrhosis or hepatic steatosis in the hepatobiliary system, multiple hepatic cysts with a dominant 2.2 cm lobulated cyst adjacent to the falciform ligament and a 2.2 cm hemorrhagic cystic lesion in segment 2, favoring hemorrhage/retraction of a benign hepatic cyst. The gallbladder, pancreas, spleen, adrenal glands, urinary tract, and stomach/bowel are within normal limits. The patient's height is 5' 3.5" (1.613 m) and weight is 129 lb 3.2 oz (58.6 kg). Her vital signs include a temporal temperature of 98.4 F (36.9 C), blood pressure of 130/78, pulse of 80, and oxygen saturation of 98%.  Clinical Problem Representation: A 57 year old female with a complex medical history including  hyperlipidemia, benzodiazepine dependence, GERD, lumbago with sciatica, podagra, hypertension, generalized anxiety disorder, memory impairment, and lymphadenopathy presents with unintentional weight loss and chronic subcutaneous nodules on her right neck and mid-back. Her medications include a range of treatments for her conditions, and her laboratory findings are notable for a slightly elevated glucose level. Imaging studies reveal multiple hepatic cysts with a significant hemorrhagic cystic lesion but no other abnormalities. Her vital signs are stable.   Most  Likely Diagnoses: - Sarcoidosis: The presence of chronic subcutaneous nodules, lymphadenopathy, and unintentional weight loss could suggest sarcoidosis, a multisystem granulomatous disease. The patient's symptoms and findings, including the subcutaneous nodules which could represent cutaneous sarcoidosis, and lymphadenopathy, are consistent with this diagnosis. However, the absence of pulmonary involvement or elevated serum calcium levels, common in sarcoidosis, are not mentioned.  - Lymphoma: Given the chronic subcutaneous nodules, lymphadenopathy, and unintentional weight loss, lymphoma is a consideration. The patient's systemic symptoms and the presence of lymphadenopathy could be indicative of lymphoma. However, the lack of specific laboratory findings such as elevated lactate dehydrogenase (LDH) or abnormal complete blood count (CBC) findings typically associated with lymphoma are not present.  Assessment: A 57 year old female with a complex medical history, including hyperlipidemia, benzodiazepine dependence, GERD, lumbago with sciatica, podagra, hypertension, generalized anxiety disorder, memory impairment, and lymphadenopathy, presents with unintentional weight loss and chronic subcutaneous nodules on her right neck and mid-back. The patient's medication regimen is comprehensive, addressing various aspects of her medical history. Laboratory  findings are notable for a slightly elevated glucose level, while imaging studies reveal multiple hepatic cysts with a significant hemorrhagic cystic lesion but no other abnormalities. Her vital signs are stable.  The differential diagnosis for this patient includes sarcoidosis, given the chronic subcutaneous nodules and lymphadenopathy, despite the absence of pulmonary involvement or elevated serum calcium levels. Lymphoma is also considered due to the chronic subcutaneous nodules, lymphadenopathy, and unintentional weight loss, although specific laboratory findings typically associated with lymphoma are not present. Benign hepatic cysts with secondary hemorrhage could explain the unintentional weight loss if abdominal discomfort led to decreased oral intake, but this does not account for the subcutaneous nodules or lymphadenopathy.  Plan: 1. Sarcoidosis:    - Dx: Obtain a chest X-ray and consider a high-resolution CT scan of the chest to evaluate for pulmonary involvement. Serum angiotensin-converting enzyme (ACE) levels and a Kveim test may also be helpful. Biopsy of one of the subcutaneous nodules or lymph nodes could provide a definitive diagnosis.    - Tx: If sarcoidosis is confirmed, management may include corticosteroids for systemic symptoms. Monitor for potential complications, including lung, eye, and liver involvement.  2. Lymphoma:    - Dx: Excisional biopsy of a subcutaneous nodule or lymph node for histopathological examination. Additional imaging, such as a PET scan, may be warranted to assess for systemic involvement.    - Tx: Treatment would be based on the specific type of lymphoma diagnosed and may include chemotherapy, radiation therapy, or a combination of both. Referral to an oncologist is essential.  3. Benign Hepatic Cysts with Secondary Hemorrhage:    - Dx: Further evaluation of the hepatic cysts may include serial imaging to monitor for changes in size or characteristics.  Consideration of an MRI with a hepatobiliary-specific contrast agent for a more detailed assessment.    - Tx: Management typically involves monitoring unless the cysts cause significant symptoms or complications, in which case interventional radiology procedures or surgery may be indicated.  4. Expanded Differential Diagnoses:    - Rheumatoid Arthritis (RA) with Rheumatoid Nodules: If joint symptoms develop or if rheumatoid factor/anti-CCP antibodies are positive, consider this diagnosis.    - Infectious Etiologies: Although less likely, ensure up-to-date tuberculosis screening and consider atypical mycobacterial infection if risk factors are present.    - Granulomatosis with Polyangiitis (GPA): ANCA testing could be considered if symptoms suggestive of GPA develop.  5. Can't Miss Differential Diagnosis:    - Metastatic Cancer: Ensure thorough evaluation including  biopsy of suspicious nodules.    - Infective Endocarditis: If new heart murmurs develop or if the patient has risk factors, echocardiography should be performed.    - Systemic Lupus Erythematosus (SLE): ANA and anti-dsDNA antibodies testing if new symptoms suggestive of SLE appear.  For each of these considerations, a multidisciplinary approach involving rheumatology, oncology, infectious disease, and potentially gastroenterology will be crucial for comprehensive care. Regular follow-up to reassess symptoms, monitor the effectiveness of treatments, and adjust the management plan as necessary is essential.  - Benign Hepatic Cysts with Secondary Hemorrhage: The MRI findings of multiple hepatic cysts, including a hemorrhagic cystic lesion, could explain the unintentional weight loss if the patient experienced abdominal discomfort leading to decreased oral intake. However, this diagnosis does not account for the subcutaneous nodules or lymphadenopathy.   Expanded Differential Diagnoses: - Rheumatoid Arthritis (RA) with Rheumatoid Nodules:  Considering the patient's chronic subcutaneous nodules and systemic symptoms, RA with rheumatoid nodules could be a possibility, especially if the nodules are located in typical areas for rheumatoid nodules. The absence of joint symptoms or positive rheumatoid factor/anti-CCP antibodies in the provided information makes this less likely.  - Infectious Etiologies (e.g., Tuberculosis, Atypical Mycobacterial Infection): Given the chronic nature of the subcutaneous nodules and lymphadenopathy, infectious etiologies such as tuberculosis or atypical mycobacterial infection could be considered, especially if there's a relevant exposure history. The lack of fever, night sweats, or a clear exposure history in the provided information makes this less likely.  - Granulomatosis with Polyangiitis (GPA): GPA could present with weight loss, subcutaneous nodules, and systemic symptoms. However, the absence of respiratory or renal symptoms commonly associated with GPA makes this diagnosis less likely.   Can't Miss Differential Diagnosis: - Metastatic Cancer: While not directly indicated by the provided information, the unintentional weight loss and presence of subcutaneous nodules could potentially indicate an underlying metastatic cancer. Further investigation, including biopsy of the nodules, would be necessary to rule this out.  - Infective Endocarditis: Although less likely without specific symptoms such as fever or new heart murmurs, the chronic use of medications and the presence of subcutaneous nodules (which could be Osler's nodes) necessitate consideration of infective endocarditis, especially if the patient has any risk factors such as intravenous drug use or pre-existing valvular heart disease.  - Systemic Lupus Erythematosus (SLE): Given the multisystem involvement and presence of subcutaneous nodules, SLE could be a "can't miss" diagnosis due to its potential for rapid progression and involvement of critical  organs such as the kidneys (lupus nephritis). However, the absence of specific symptoms or laboratory findings associated with SLE makes this less likely.Assessment: A 57 year old female with a complex medical history, including hyperlipidemia, benzodiazepine dependence, GERD, lumbago with sciatica, podagra, hypertension, generalized anxiety disorder, memory impairment, and lymphadenopathy, presents with unintentional weight loss and chronic subcutaneous nodules on her right neck and mid-back. The patient's medication regimen is comprehensive, addressing various aspects of her medical history. Laboratory findings are notable for a slightly elevated glucose level, while imaging studies reveal multiple hepatic cysts with a significant hemorrhagic cystic lesion but no other abnormalities. Her vital signs are stable.  The differential diagnosis for this patient includes sarcoidosis, given the chronic subcutaneous nodules and lymphadenopathy, despite the absence of pulmonary involvement or elevated serum calcium levels. Lymphoma is also considered due to the chronic subcutaneous nodules, lymphadenopathy, and unintentional weight loss, although specific laboratory findings typically associated with lymphoma are not present. Benign hepatic cysts with secondary hemorrhage could explain the unintentional weight loss if abdominal  discomfort led to decreased oral intake, but this does not account for the subcutaneous nodules or lymphadenopathy.  Plan: 1. Sarcoidosis:    - Dx: Obtain a chest X-ray and consider a high-resolution CT scan of the chest to evaluate for pulmonary involvement. Serum angiotensin-converting enzyme (ACE) levels and a Kveim test may also be helpful. Biopsy of one of the subcutaneous nodules or lymph nodes could provide a definitive diagnosis.    - Tx: If sarcoidosis is confirmed, management may include corticosteroids for systemic symptoms. Monitor for potential complications, including lung, eye,  and liver involvement.  2. Lymphoma:    - Dx: Excisional biopsy of a subcutaneous nodule or lymph node for histopathological examination. Additional imaging, such as a PET scan, may be warranted to assess for systemic involvement.    - Tx: Treatment would be based on the specific type of lymphoma diagnosed and may include chemotherapy, radiation therapy, or a combination of both. Referral to an oncologist is essential.  3. Benign Hepatic Cysts with Secondary Hemorrhage:    - Dx: Further evaluation of the hepatic cysts may include serial imaging to monitor for changes in size or characteristics. Consideration of an MRI with a hepatobiliary-specific contrast agent for a more detailed assessment.    - Tx: Management typically involves monitoring unless the cysts cause significant symptoms or complications, in which case interventional radiology procedures or surgery may be indicated.  4. Expanded Differential Diagnoses:    - Rheumatoid Arthritis (RA) with Rheumatoid Nodules: If joint symptoms develop or if rheumatoid factor/anti-CCP antibodies are positive, consider this diagnosis.    - Infectious Etiologies: Although less likely, ensure up-to-date tuberculosis screening and consider atypical mycobacterial infection if risk factors are present.    - Granulomatosis with Polyangiitis (GPA): ANCA testing could be considered if symptoms suggestive of GPA develop.  5. Can't Miss Differential Diagnosis:    - Metastatic Cancer: Ensure thorough evaluation including biopsy of suspicious nodules.    - Infective Endocarditis: If new heart murmurs develop or if the patient has risk factors, echocardiography should be performed.    - Systemic Lupus Erythematosus (SLE): ANA and anti-dsDNA antibodies testing if new symptoms suggestive of SLE appear.  For each of these considerations, a multidisciplinary approach involving rheumatology, oncology, infectious disease, and potentially gastroenterology will be crucial  for comprehensive care. Regular follow-up to reassess symptoms, monitor the effectiveness of treatments, and adjust the management plan as necessary is essential.   Ruq Abdominal Tenderness   Found on exam 09/24/21, not reported prior  Associated with MRI showing hemorrhagic liver cysts   Cystic Disease of Liver   Patients chart review and interview were used to generate a prompt for artificial intelligence analysis ([employer] artificial intelligence) clinical decision support.  AI output was reviewed and is provided in red:   Upon analyzing the patient's presentation, several factors stand out: The presence of a hemorrhagic cystic lesion in the liver, which has shown changes over time. Significant unintentional weight loss. Chronic subcutaneous nodules and lymphadenopathy, suggesting a systemic process. Considering these factors, potential causes for the liver cysts include: Benign hepatic cysts, which are common and often asymptomatic, but the patient's significant weight loss and systemic symptoms suggest a need to explore additional or alternative diagnoses. Metastatic disease, given the weight loss and subcutaneous nodules, although the MRI findings favor a benign process. Infections or parasitic diseases, which can cause liver cysts, but the patient's clinical presentation and laboratory findings do not strongly support this diagnosis. Autoimmune conditions or systemic diseases like  sarcoidosis, which could explain the multi-system involvement including lymphadenopathy, subcutaneous nodules, and potentially liver cysts. Systematically Ensure Accuracy & Precision Re-evaluating the patient's presentation and the differential diagnosis, the benign nature of the hepatic cyst as suggested by imaging, combined with systemic symptoms like weight loss, subcutaneous nodules, and lymphadenopathy, points towards a systemic process that may not be directly related to the liver cyst. The liver cyst may  be an incidental finding, with the systemic symptoms warranting a broader diagnostic approach. Given the lack of specific symptoms or laboratory findings pointing to a malignant or infectious cause for the liver cyst, and considering the patient's complex medical history, a systemic autoimmune or inflammatory condition could be a unifying diagnosis that requires further investigation. Final Answer The liver cysts in this patient, given the context of unintentional weight loss, chronic subcutaneous nodules, and lymphadenopathy, may be part of a broader systemic process rather than an isolated hepatic issue. While the MRI findings favor a benign hepatic cyst, the systemic symptoms suggest the need for further evaluation for systemic diseases, including autoimmune conditions or sarcoidosis. A multidisciplinary approach involving hepatology, rheumatology, and possibly oncology, depending on evolving findings, is recommended to comprehensively address the patient's symptoms and to ensure a holistic approach to her care.   Carpal Tunnel Syndrome (Resolved)   R sided release 2022, all better now.      Depression Screen    09/25/2022    8:35 AM  PHQ 2/9 Scores  PHQ - 2 Score 6  PHQ- 9 Score 22   No results found for any visits on 09/25/22.  The following were reviewed and entered/updated into her MEDICAL RECORD [place] History:  Diagnosis Date   Anxiety    Bipolar 1 disorder    Carpal tunnel syndrome 09/25/2022   R sided release 2022, all better now.   Depression    Depression    GERD (gastroesophageal reflux disease)    Hypertension    Migraine    Subcutaneous nodule 09/25/2022   Past Surgical History:  Procedure Laterality Date   CARPAL TUNNEL RELEASE Right 04/18/2021   Procedure: RIGHT ENDOSCOPIC CARPAL TUNNEL RELEASE;  Surgeon: Milly Jakob, MD;  Location: [place];  Service: Orthopedics;  Laterality: Right;  LENTH OF SURGERY: 30 MINUTES   DILATION AND  CURETTAGE OF UTERUS     Family History  Problem Relation Age of Onset   Hypertension Mother    Stroke Father    Outpatient Medications Prior to Visit  Medication Sig Dispense Refill   atorvastatin (LIPITOR) 20 MG tablet Take 20 mg by mouth daily.  1   clonazePAM (KLONOPIN) 2 MG tablet Take 2 mg by mouth at bedtime.      gabapentin (NEURONTIN) 300 MG capsule Take by mouth.     ibuprofen (ADVIL) 200 MG tablet Take 3 tablets (600 mg total) by mouth every 6 (six) hours.     lisinopril-hydrochlorothiazide (PRINZIDE,ZESTORETIC) 10-12.5 MG tablet Take 1 tablet by mouth daily.  1   cyclobenzaprine (FLEXERIL) 10 MG tablet Take 10 mg by mouth daily as needed for muscle spasms.   2   esomeprazole (NEXIUM) 20 MG capsule Take by mouth.     promethazine (PHENERGAN) 25 MG suppository Place 1 suppository (25 mg total) rectally every 6 (six) hours as needed for nausea or vomiting. (Patient not taking: Reported on 09/25/2022) 12 each 0   acetaminophen (TYLENOL) 325 MG tablet Take 2 tablets (650 mg total) by mouth every 6 (six) hours. (Patient not taking: Reported on  09/25/2022)     B Complex-Folic Acid (B COMPLEX-VITAMIN B12 PO) Take by mouth. (Patient not taking: Reported on 09/25/2022)     COLLAGEN PO Take by mouth. (Patient not taking: Reported on 09/25/2022)     omeprazole (PRILOSEC) 20 MG capsule Take 20 mg by mouth daily as needed (reflux).  (Patient not taking: Reported on 09/25/2022)  1   ondansetron (ZOFRAN ODT) 4 MG disintegrating tablet Take 1 tablet (4 mg total) by mouth every 8 (eight) hours as needed for nausea or vomiting. (Patient not taking: Reported on 09/25/2022) 20 tablet 0   oxyCODONE (ROXICODONE) 5 MG immediate release tablet Take 1 tablet (5 mg total) by mouth every 6 (six) hours as needed for severe pain. (Patient not taking: Reported on 09/25/2022) 8 tablet 0   Prenatal Vit-Fe Fumarate-FA (M-VIT PO) Take by mouth. (Patient not taking: Reported on 09/25/2022)     VITAMIN A PO Take by mouth. (Patient  not taking: Reported on 09/25/2022)     No facility-administered medications prior to visit.    No Known Allergies Social History   Tobacco Use   Smoking status: Never   Smokeless tobacco: Never  Substance Use Topics   Alcohol use: No    Comment: occas.   Drug use: No     There is no immunization history on file for this patient.  Objective:  Physical Exam  BP 130/78 (BP Location: Left Arm, Patient Position: Sitting)   Pulse 80   Temp 98.4 F (36.9 C) (Temporal)   Ht 5' 3.5" (1.613 m)   Wt 129 lb 3.2 oz (58.6 kg)   LMP 12/20/2011   SpO2 98%   BMI 22.53 kg/m  Wt Readings from Last 10 Encounters:  09/25/22 129 lb 3.2 oz (58.6 kg)  04/18/21 175 lb 11.3 oz (79.7 kg)  05/19/18 173 lb (78.5 kg)   Vital signs reviewed.  Nursing notes reviewed. Weight trend reviewed. Abnormalities and Problem-Specific physical exam findings:  right upper quadrant of abdomen tenderness, subcutaneous nodules right flank and right mid lower cervical chain General Appearance:  Well developed, well nourished, well-groomed, healthy-appearing female with Body mass index is 22.53 kg/m.Marland Kitchen No acute distress appreciable.   Skin: Clear and well-hydrated. Pulmonary:  Normal work of breathing at rest, no respiratory distress apparent. SpO2: 98 %  Musculoskeletal:  Patient demonstrates smooth and coordinated movements throughout all major joints. All extremities are intact.  Neurological:  Awake, alert, oriented, and engaged.  No obvious focal neurological deficits or cognitive impairments.  Sensorium seems unclouded. Gait is smooth and coordinated Psychiatric:  Appropriate mood, pleasant and cooperative demeanor, cheerful and engaged during the exam  Results Reviewed (reviewed labs/imaging may be also be found in the assessment / plan section):    Results for orders placed or performed during the hospital encounter of [id number]  Basic metabolic panel per protocol  Result Value Ref Range   Sodium 138 135 -  145 mmol/L   Potassium 4.3 3.5 - 5.1 mmol/L   Chloride 102 98 - 111 mmol/L   CO2 31 22 - 32 mmol/L   Glucose, Bld 99 70 - 99 mg/dL   BUN 17 6 - 20 mg/dL   Creatinine, Ser 0.68 0.44 - 1.00 mg/dL   Calcium 9.5 8.9 - 10.3 mg/dL   GFR, Estimated >60 >60 mL/min   Anion gap 5 5 - 15

## 2022-09-25 NOTE — Assessment & Plan Note (Signed)
Agreed to continue Klonopin when it comes due although prefer taper  Need to first find out what's going on with weight loss Need to also try other medication(s) she hasn't been on she reports. Will start with Remeron for weight gain and help sleep with maybe half Klonopin.

## 2022-09-25 NOTE — Assessment & Plan Note (Signed)
Main concern(s) today- 50 pounds 1 year unintentional weight loss at age 57 is worrisome. Start Remeron to treat Get gastrointestinal and surgery input Massive lab workup Return to clinic just 1 month to re-evaluate  Artificial intelligence analysis completed doesn't feel like its giving me the right answer for the symptom(s).

## 2022-09-25 NOTE — Assessment & Plan Note (Signed)
add pepcid complete Have gastroenterologist evaluate

## 2022-09-25 NOTE — Assessment & Plan Note (Signed)
Referring to gastroenterologist for this as well

## 2022-09-25 NOTE — Patient Instructions (Addendum)
It was a pleasure seeing you today!  Your health and satisfaction are my top priorities. If you believe your experience today was worthy of a 5-star rating, I'd be grateful for your feedback! Loralee Pacas, MD   CHECKOUT CHECKLIST  []    Schedule next appointment(s):    Return in about 2 months (around 12/07/2022) for f/u clonazepam and anxiety mgmt.Marland Kitchen  also return to clinic 1 month for weight loss follow up  Any requested lab visits should be scheduled as appointments too  If you are not doing well:  Return to the office sooner Please bring all your medicine bottles to each appointment If your condition begins to worsen or become severe:  go to the emergency room or even call 911  []    Sign release of information authorizations: Any records we need for your care and to be your medical home  []    X-rays can be obtained at the  [facility] office. You can walk in M-F between 8:30am- noon or 1pm - 5pm. Tell them you are there for xrays ordered by me. They will send me the results, then I will let you know the results with instructions. Address: [address].  The Xray department is located in the basement.     []   (Optional):  Review your clinical notes on MyChart after they are completed.     Today's draft of the physician documented plan for today's visit: (final revisions will be visible on MyChart chart later) Unintentional weight loss Assessment & Plan: Main concern(s) today- 50 pounds 1 year unintentional weight loss at age 57 is worrisome. Start Remeron to treat Get gastrointestinal and surgery input Massive lab workup Return to clinic just 1 month to re-evaluate  Artificial intelligence analysis completed doesn't feel like its giving me the right answer for the symptom(s).  Orders: -     Ambulatory referral to Gastroenterology -     Ambulatory referral to General Surgery -     DG Chest 2 View; Future -     Cyclic citrul peptide antibody, IgG -     Rheumatoid  factor -     ANA -     VITAMIN D 25 Hydroxy (Vit-D Deficiency, Fractures) -     B12 and Folate Panel -     ANCA screen with reflex titer -     Thyroid Panel With TSH; Future  Screening examination for infectious disease -     Hepatitis C antibody -     HIV Antibody (routine testing w rflx)  Hyperlipidemia, unspecified hyperlipidemia type Assessment & Plan: Check lipids encouraged continuing with current medication(s)   Orders: -     Lipid panel  Benzodiazepine dependence Assessment & Plan: Agreed to continue Klonopin when it comes due although prefer taper  Need to first find out what's going on with weight loss Need to also try other medication(s) she hasn't been on she reports. Will start with Remeron for weight gain and help sleep with maybe half Klonopin.   Gastroesophageal reflux disease without esophagitis Assessment & Plan: add pepcid complete Have gastroenterologist evaluate   Orders: -     Esomeprazole Magnesium; Take 1 capsule (20 mg total) by mouth daily at 6 (six) AM.  Dispense: 30 capsule; Refill: 2 -     Pepcid Complete; Chew 1 tablet by mouth daily as needed.  Dispense: 100 tablet; Refill: 11 -     H. pylori antigen, stool  Lumbago of lumbar region with  sciatica Assessment & Plan: Not currently flaring, encouraged continuing with flexeril/gabapentin prn  Orders: -     Cyclobenzaprine HCl; Take 1 tablet (10 mg total) by mouth daily as needed for muscle spasms.  Dispense: 30 tablet; Refill: 2  Podagra  Carpal tunnel syndrome of right wrist  Hypertension, unspecified type Assessment & Plan: Stable well-controlled encouraged continuing with current meds  Orders: -     CBC -     Comprehensive metabolic panel  Generalized anxiety disorder -     Ambulatory referral to Psychiatry -     Ambulatory referral to Psychology  Memory impairment  Nausea -     Ondansetron; Take 1 tablet (4 mg total) by mouth every 8 (eight) hours as needed for nausea or  vomiting.  Dispense: 20 tablet; Refill: 0  Subcutaneous nodules Assessment & Plan: Stable, per patient  Will have surgeon evaluate for biopsy given the massive weight loss  Orders: -     Ambulatory referral to General Surgery -     DG Chest 2 View; Future  Other fatigue -     Ambulatory referral to Gastroenterology -     Hepatitis C antibody -     HIV Antibody (routine testing w rflx) -     Lipid panel -     CBC -     Comprehensive metabolic panel -     Ambulatory referral to Psychiatry -     Cyclobenzaprine HCl; Take 1 tablet (10 mg total) by mouth daily as needed for muscle spasms.  Dispense: 30 tablet; Refill: 2 -     Esomeprazole Magnesium; Take 1 capsule (20 mg total) by mouth daily at 6 (six) AM.  Dispense: 30 capsule; Refill: 2 -     Pepcid Complete; Chew 1 tablet by mouth daily as needed.  Dispense: 100 tablet; Refill: 11 -     Ambulatory referral to Psychology -     Mirtazapine; Take 1 tablet (7.5 mg total) by mouth at bedtime.  Dispense: 30 tablet; Refill: 2 -     Ambulatory referral to General Surgery -     DG Chest 2 View; Future -     Cyclic citrul peptide antibody, IgG -     Rheumatoid factor -     ANA -     VITAMIN D 25 Hydroxy (Vit-D Deficiency, Fractures) -     B12 and Folate Panel -     ANCA screen with reflex titer -     Thyroid Panel With TSH; Future -     Ondansetron; Take 1 tablet (4 mg total) by mouth every 8 (eight) hours as needed for nausea or vomiting.  Dispense: 20 tablet; Refill: 0  Right upper quadrant abdominal tenderness without rebound tenderness Assessment & Plan: Referring to gastroenterologist for this as well   Cystic disease of liver -     AFP tumor marker -     Hepatitis panel, acute -     Sedimentation rate -     C-reactive protein      QUESTIONS & CONCERNS: CLINICAL: please contact us via phone [phone] OR MyChart messaging  LAB & IMAGING:   We will call you if the results are significantly abnormal or you don't use  MyChart.  Most normal results will be posted to MyChart immediately and have a clinical review message by Dr. Randol Kern posted within 2-3 business days.   If you have not heard from [place] regarding the results in 2 weeks OR if you need  priority reporting, please contact this office. MYCHART:  The fastest way to get your results and easiest way to stay in touch with [place] is by activating your My Chart account. Instructions are located on the last page of this paperwork.  BILLING: xray and lab orders are billed from separate companies and questions./concerns should be directed to the [facility].  For visit charges please discuss with our administrative services COMPLAINTS:  please let Dr. Randol Kern know or see the [facility], by asking at the front desk: we want you to be satisfied with every experience and we would be grateful for the opportunity to address any problems

## 2022-09-25 NOTE — Assessment & Plan Note (Signed)
Stable well-controlled encouraged continuing with current meds

## 2022-09-25 NOTE — Assessment & Plan Note (Signed)
Stable, per patient  Will have surgeon evaluate for biopsy given the massive weight loss

## 2022-09-26 LAB — THYROID PANEL WITH TSH
Free Thyroxine Index: 2.5 (ref 1.4–3.8)
T3 Uptake: 33 % (ref 22–35)
T4, Total: 7.7 ug/dL (ref 5.1–11.9)
TSH: 0.63 mIU/L (ref 0.40–4.50)

## 2022-09-26 NOTE — Progress Notes (Signed)
Please place any orders needed / requested. Arrange/confirm follow up appt(s).

## 2022-09-27 LAB — HIV ANTIBODY (ROUTINE TESTING W REFLEX): HIV 1&2 Ab, 4th Generation: NONREACTIVE

## 2022-09-27 LAB — ANCA SCREEN W REFLEX TITER: ANCA SCREEN: NEGATIVE

## 2022-09-27 LAB — RHEUMATOID FACTOR: Rheumatoid fact SerPl-aCnc: <14 IU/mL (ref <14)

## 2022-09-27 LAB — CYCLIC CITRUL PEPTIDE ANTIBODY, IGG: Cyclic Citrullin Peptide Ab: <16 UNITS

## 2022-09-27 LAB — HEPATITIS C ANTIBODY: Hepatitis C Ab: NONREACTIVE

## 2022-09-27 LAB — ANA: Anti Nuclear Antibody (ANA): NEGATIVE

## 2022-10-03 DIAGNOSIS — R42 Dizziness and giddiness: Secondary | ICD-10-CM | POA: Diagnosis not present

## 2022-10-03 DIAGNOSIS — E860 Dehydration: Secondary | ICD-10-CM | POA: Diagnosis not present

## 2022-10-03 DIAGNOSIS — R1013 Epigastric pain: Secondary | ICD-10-CM | POA: Diagnosis not present

## 2022-10-03 DIAGNOSIS — I10 Essential (primary) hypertension: Secondary | ICD-10-CM | POA: Diagnosis not present

## 2022-10-03 DIAGNOSIS — K7689 Other specified diseases of liver: Secondary | ICD-10-CM | POA: Diagnosis not present

## 2022-10-09 ENCOUNTER — Ambulatory Visit (INDEPENDENT_AMBULATORY_CARE_PROVIDER_SITE_OTHER): Payer: Medicaid Other | Admitting: Internal Medicine

## 2022-10-09 ENCOUNTER — Encounter: Payer: Self-pay | Admitting: Internal Medicine

## 2022-10-09 VITALS — BP 138/80 | HR 98 | Temp 98.3°F | Ht 63.5 in | Wt 122.0 lb

## 2022-10-09 DIAGNOSIS — R233 Spontaneous ecchymoses: Secondary | ICD-10-CM | POA: Diagnosis not present

## 2022-10-09 DIAGNOSIS — K7689 Other specified diseases of liver: Secondary | ICD-10-CM | POA: Diagnosis not present

## 2022-10-09 DIAGNOSIS — N950 Postmenopausal bleeding: Secondary | ICD-10-CM

## 2022-10-09 DIAGNOSIS — K828 Other specified diseases of gallbladder: Secondary | ICD-10-CM

## 2022-10-09 DIAGNOSIS — R1011 Right upper quadrant pain: Secondary | ICD-10-CM | POA: Diagnosis not present

## 2022-10-09 DIAGNOSIS — K5900 Constipation, unspecified: Secondary | ICD-10-CM

## 2022-10-09 DIAGNOSIS — R079 Chest pain, unspecified: Secondary | ICD-10-CM | POA: Diagnosis not present

## 2022-10-09 DIAGNOSIS — R634 Abnormal weight loss: Secondary | ICD-10-CM | POA: Diagnosis not present

## 2022-10-09 DIAGNOSIS — Q446 Cystic disease of liver: Secondary | ICD-10-CM | POA: Diagnosis not present

## 2022-10-09 DIAGNOSIS — R42 Dizziness and giddiness: Secondary | ICD-10-CM

## 2022-10-09 HISTORY — DX: Dizziness and giddiness: R42

## 2022-10-09 NOTE — Progress Notes (Signed)
[facility]: [phone]   Routine Medical Office Visit  Patient:  Amanda White      Age: 57 y.o.       Sex:  female  Date:   10/09/2022 PCP:    Lula Olszewski, MD   Today's Healthcare Provider: Lula Olszewski, MD   Assessment and Plan:   She called to get in this am, had appointment already for 2 days from now but preferred to be seen sooner due to not doing well Patient reported medical symptoms of concern for today are myriad and potentially quite serious.   In particular the weight loss and vaginal bleeding are highly concerning.  Suspect that whatever is causing all the weight loss is the true culprit for all these symptom(s)  Although I agree with emergency room that acute coronary syndrome or pulmonary embolism low risk, I think with the report of easy bruising  and R chest pain in setting of weight loss suspicious for malignancy, it is best to rule out pulmonary embolism and malignancy together with CT angiogram. She was agreeable to aggressive workup and close follow up (will return to clinic in 2 weeks   Unintentional weight loss Assessment & Plan: Continue close follow up 1 month   Orders: -     CT CHEST WO CONTRAST; Future -     CT PELVIS W CONTRAST; Future -     US PELVIC COMPLETE WITH TRANSVAGINAL; Future -     CT Angio Chest Pulmonary Embolism (PE) W or WO Contrast; Future  Dizziness Assessment & Plan: Started getting severe since starting mirtazapine but possibly unrelated to the medication(s)  I'm suspicious for malnutrition induced maybe vitamins or undetected dehydration... Seems reasonable given all the unintentional weight loss  Positive orthostatics in emergency room indicative of dehydration Overall I agree with emergency room evaluation. But needs more aggressive workup as outpatient which we setup today along with close follow up Advised patient to start aggressive multivitamin regimen, protein shakes, and increased fluid  while we evaluate the pelvic and chest. Gastrointestinal and gynecology referrals pending with gynecology in May.     Right-sided chest pain -     CT Angio Chest Pulmonary Embolism (PE) W or WO Contrast; Future  Cyst of gallbladder  RUQ pain  Postmenopausal bleeding -     CT CHEST WO CONTRAST; Future -     CT PELVIS W CONTRAST; Future -     US PELVIC COMPLETE WITH TRANSVAGINAL; Future  Liver cyst -     CT CHEST WO CONTRAST; Future -     CT PELVIS W CONTRAST; Future -     US PELVIC COMPLETE WITH TRANSVAGINAL; Future  Easy bruising -     CT Angio Chest Pulmonary Embolism (PE) W or WO Contrast; Future  Constipation, unspecified constipation type  Cystic disease of liver       Clinical Presentation:   57 y.o. female here today for Constipation (For about five days.), Dizziness (In the mornings especially. Intermittent since last visit-4/1.), Bruises on legs (Randomly appears.), and Chest Pain (Not currently, EKG was done at ER was normal.) This is in the context of recent very concerning 50 pounds 1 year(s) unintentional weight loss and she lost 6 more pounds in last 2 weeks despite adding on Remeron.  MRI in ER found some hepatic cysts that were hemorrhagic but nothing else of concern in abdomen where she has some pain.  Pelvis was not imaged- she is reporting postmenopausal vaginal  bleeding today.   Also chest has not yet been imaged- she doesn't smoke or have dyspnea but has been having R sided lower chest pain that might be from the hemorrhagic liver cysts with associated elevated LFT recently.   Reviewed:  has a past medical history of Anxiety, Bipolar 1 disorder, Carpal tunnel syndrome (09/25/2022), Depression, Depression, Dizziness (10/09/2022), GERD (gastroesophageal reflux disease), Hypertension, Migraine, and Subcutaneous nodule (09/25/2022). Active Ambulatory Problems    Diagnosis Date Noted   Hyperlipidemia 09/25/2022   Benzodiazepine dependence 09/25/2022   GERD  (gastroesophageal reflux disease) 09/25/2022   Lumbago of lumbar region with sciatica 09/25/2022   Podagra 09/25/2022   Hypertension 09/25/2022   Generalized anxiety disorder 09/25/2022   Memory impairment 09/25/2022   Subcutaneous nodule 09/25/2022   Unintentional weight loss 09/25/2022   RUQ abdominal tenderness 09/25/2022   Cystic disease of liver 09/25/2022   Dizziness 10/09/2022   Right-sided chest pain 10/09/2022   Cyst of gallbladder 10/09/2022   Resolved Ambulatory Problems    Diagnosis Date Noted   Carpal tunnel syndrome 09/25/2022   Past Medical History:  Diagnosis Date   Anxiety    Bipolar 1 disorder    Depression    Depression    Migraine     Outpatient Medications Prior to Visit  Medication Sig   atorvastatin (LIPITOR) 20 MG tablet Take 20 mg by mouth daily.   clonazePAM (KLONOPIN) 2 MG tablet Take 2 mg by mouth at bedtime.    cyclobenzaprine (FLEXERIL) 10 MG tablet Take 1 tablet (10 mg total) by mouth daily as needed for muscle spasms.   esomeprazole (NEXIUM) 20 MG capsule Take 1 capsule (20 mg total) by mouth daily at 6 (six) AM.   famotidine-calcium carbonate-magnesium hydroxide (PEPCID COMPLETE) 10-800-165 MG chewable tablet Chew 1 tablet by mouth daily as needed.   gabapentin (NEURONTIN) 300 MG capsule Take by mouth.   ibuprofen (ADVIL) 200 MG tablet Take 3 tablets (600 mg total) by mouth every 6 (six) hours.   lisinopril-hydrochlorothiazide (PRINZIDE,ZESTORETIC) 10-12.5 MG tablet Take 1 tablet by mouth daily.   mirtazapine (REMERON) 7.5 MG tablet Take 1 tablet (7.5 mg total) by mouth at bedtime.   ondansetron (ZOFRAN ODT) 4 MG disintegrating tablet Take 1 tablet (4 mg total) by mouth every 8 (eight) hours as needed for nausea or vomiting.   promethazine (PHENERGAN) 25 MG suppository Place 1 suppository (25 mg total) rectally every 6 (six) hours as needed for nausea or vomiting.   No facility-administered medications prior to visit.    HPI  Updated and  modified:  Problem  Dizziness   Saw emergency room 10/03/22 workup with mr abdomen, labwork, EKG were all  unrevealing    Right-Sided Chest Pain  Cyst of Gallbladder  Unintentional Weight Loss   Wt Readings from Last 5 Encounters:  10/09/22 122 lb (55.3 kg)  09/25/22 129 lb 3.2 oz (58.6 kg)  04/18/21 175 lb 11.3 oz (79.7 kg)  05/19/18 173 lb (78.5 kg)  50 pounds 1 year unintentional weight loss at age 75 is worrisome.   On 09/25/22  poorly controlled gastroesophageal reflux disease despite Nexium, also chronic subcutaneous nodule R neck and right flank.  No pulmonary symptom(s). Right upper quadrant of abdomen pain with hemorrhagic liver cysts on MRI.  This improved after adding Pepcid complete and she stopped Nexium.  Remeron trial given and she is taking now and having more dizziness since starting it 09/25/22 referred to gastrointestinal and surgery input Massive lab workup negative  Patients chart review  and interview were used to generate a prompt for artificial intelligence analysis ([employer] artificial intelligence) clinical decision support.  AI output was reviewed and is provided in red: Comprehensive Review of the Case: A 57 year old female presents as a new patient with unintentional weight loss and chronic subcutaneous nodules on her right neck and mid-back. Her medical history is significant for hyperlipidemia, benzodiazepine dependence, GERD, lumbago with sciatica, podagra, hypertension, generalized anxiety disorder, memory impairment, and lymphadenopathy. She has a past medical history of anxiety, bipolar 1 disorder, carpal tunnel syndrome, depression, GERD, hypertension, and migraine. Her current medications include atorvastatin, clonazepam, cyclobenzaprine, esomeprazole, gabapentin, ibuprofen, lisinopril-hydrochlorothiazide, ondansetron, and promethazine. Laboratory findings reveal Na 138, K 4.3, Cl 102, CO2 31, glucose 17, creatinine 0.68, Ca 9.5, protein 6.9, albumin 4.1, AST  21, ALT 22, alkaline phosphatase 49, bilirubin 0.6, WBC 7.9, HGB 12.8, HCT 35.9, MCV 90.2, and platelets 315. MRI of the lower chest shows clear lung bases, no morphologic findings of cirrhosis or hepatic steatosis in the hepatobiliary system, multiple hepatic cysts with a dominant 2.2 cm lobulated cyst adjacent to the falciform ligament and a 2.2 cm hemorrhagic cystic lesion in segment 2, favoring hemorrhage/retraction of a benign hepatic cyst. The gallbladder, pancreas, spleen, adrenal glands, urinary tract, and stomach/bowel are within normal limits. The patient's height is 5' 3.5" (1.613 m) and weight is 129 lb 3.2 oz (58.6 kg). Her vital signs include a temporal temperature of 98.4 F (36.9 C), blood pressure of 130/78, pulse of 80, and oxygen saturation of 98%.  Clinical Problem Representation: A 57 year old female with a complex medical history including hyperlipidemia, benzodiazepine dependence, GERD, lumbago with sciatica, podagra, hypertension, generalized anxiety disorder, memory impairment, and lymphadenopathy presents with unintentional weight loss and chronic subcutaneous nodules on her right neck and mid-back. Her medications include a range of treatments for her conditions, and her laboratory findings are notable for a slightly elevated glucose level. Imaging studies reveal multiple hepatic cysts with a significant hemorrhagic cystic lesion but no other abnormalities. Her vital signs are stable.   Most Likely Diagnoses: - Sarcoidosis: The presence of chronic subcutaneous nodules, lymphadenopathy, and unintentional weight loss could suggest sarcoidosis, a multisystem granulomatous disease. The patient's symptoms and findings, including the subcutaneous nodules which could represent cutaneous sarcoidosis, and lymphadenopathy, are consistent with this diagnosis. However, the absence of pulmonary involvement or elevated serum calcium levels, common in sarcoidosis, are not mentioned.  - Lymphoma:  Given the chronic subcutaneous nodules, lymphadenopathy, and unintentional weight loss, lymphoma is a consideration. The patient's systemic symptoms and the presence of lymphadenopathy could be indicative of lymphoma. However, the lack of specific laboratory findings such as elevated lactate dehydrogenase (LDH) or abnormal complete blood count (CBC) findings typically associated with lymphoma are not present.  Assessment: A 57 year old female with a complex medical history, including hyperlipidemia, benzodiazepine dependence, GERD, lumbago with sciatica, podagra, hypertension, generalized anxiety disorder, memory impairment, and lymphadenopathy, presents with unintentional weight loss and chronic subcutaneous nodules on her right neck and mid-back. The patient's medication regimen is comprehensive, addressing various aspects of her medical history. Laboratory findings are notable for a slightly elevated glucose level, while imaging studies reveal multiple hepatic cysts with a significant hemorrhagic cystic lesion but no other abnormalities. Her vital signs are stable.  The differential diagnosis for this patient includes sarcoidosis, given the chronic subcutaneous nodules and lymphadenopathy, despite the absence of pulmonary involvement or elevated serum calcium levels. Lymphoma is also considered due to the chronic subcutaneous nodules, lymphadenopathy, and unintentional weight loss, although specific  laboratory findings typically associated with lymphoma are not present. Benign hepatic cysts with secondary hemorrhage could explain the unintentional weight loss if abdominal discomfort led to decreased oral intake, but this does not account for the subcutaneous nodules or lymphadenopathy.  Plan: 1. Sarcoidosis:    - Dx: Obtain a chest X-ray and consider a high-resolution CT scan of the chest to evaluate for pulmonary involvement. Serum angiotensin-converting enzyme (ACE) levels and a Kveim test may also be  helpful. Biopsy of one of the subcutaneous nodules or lymph nodes could provide a definitive diagnosis.    - Tx: If sarcoidosis is confirmed, management may include corticosteroids for systemic symptoms. Monitor for potential complications, including lung, eye, and liver involvement.  2. Lymphoma:    - Dx: Excisional biopsy of a subcutaneous nodule or lymph node for histopathological examination. Additional imaging, such as a PET scan, may be warranted to assess for systemic involvement.    - Tx: Treatment would be based on the specific type of lymphoma diagnosed and may include chemotherapy, radiation therapy, or a combination of both. Referral to an oncologist is essential.  3. Benign Hepatic Cysts with Secondary Hemorrhage:    - Dx: Further evaluation of the hepatic cysts may include serial imaging to monitor for changes in size or characteristics. Consideration of an MRI with a hepatobiliary-specific contrast agent for a more detailed assessment.    - Tx: Management typically involves monitoring unless the cysts cause significant symptoms or complications, in which case interventional radiology procedures or surgery may be indicated.  4. Expanded Differential Diagnoses:    - Rheumatoid Arthritis (RA) with Rheumatoid Nodules: If joint symptoms develop or if rheumatoid factor/anti-CCP antibodies are positive, consider this diagnosis.    - Infectious Etiologies: Although less likely, ensure up-to-date tuberculosis screening and consider atypical mycobacterial infection if risk factors are present.    - Granulomatosis with Polyangiitis (GPA): ANCA testing could be considered if symptoms suggestive of GPA develop.  5. Can't Miss Differential Diagnosis:    - Metastatic Cancer: Ensure thorough evaluation including biopsy of suspicious nodules.    - Infective Endocarditis: If new heart murmurs develop or if the patient has risk factors, echocardiography should be performed.    - Systemic Lupus  Erythematosus (SLE): ANA and anti-dsDNA antibodies testing if new symptoms suggestive of SLE appear.  For each of these considerations, a multidisciplinary approach involving rheumatology, oncology, infectious disease, and potentially gastroenterology will be crucial for comprehensive care. Regular follow-up to reassess symptoms, monitor the effectiveness of treatments, and adjust the management plan as necessary is essential.  - Benign Hepatic Cysts with Secondary Hemorrhage: The MRI findings of multiple hepatic cysts, including a hemorrhagic cystic lesion, could explain the unintentional weight loss if the patient experienced abdominal discomfort leading to decreased oral intake. However, this diagnosis does not account for the subcutaneous nodules or lymphadenopathy.   Expanded Differential Diagnoses: - Rheumatoid Arthritis (RA) with Rheumatoid Nodules: Considering the patient's chronic subcutaneous nodules and systemic symptoms, RA with rheumatoid nodules could be a possibility, especially if the nodules are located in typical areas for rheumatoid nodules. The absence of joint symptoms or positive rheumatoid factor/anti-CCP antibodies in the provided information makes this less likely.  - Infectious Etiologies (e.g., Tuberculosis, Atypical Mycobacterial Infection): Given the chronic nature of the subcutaneous nodules and lymphadenopathy, infectious etiologies such as tuberculosis or atypical mycobacterial infection could be considered, especially if there's a relevant exposure history. The lack of fever, night sweats, or a clear exposure history in the provided information  makes this less likely.  - Granulomatosis with Polyangiitis (GPA): GPA could present with weight loss, subcutaneous nodules, and systemic symptoms. However, the absence of respiratory or renal symptoms commonly associated with GPA makes this diagnosis less likely.   Can't Miss Differential Diagnosis: - Metastatic Cancer: While  not directly indicated by the provided information, the unintentional weight loss and presence of subcutaneous nodules could potentially indicate an underlying metastatic cancer. Further investigation, including biopsy of the nodules, would be necessary to rule this out.  - Infective Endocarditis: Although less likely without specific symptoms such as fever or new heart murmurs, the chronic use of medications and the presence of subcutaneous nodules (which could be Osler's nodes) necessitate consideration of infective endocarditis, especially if the patient has any risk factors such as intravenous drug use or pre-existing valvular heart disease.  - Systemic Lupus Erythematosus (SLE): Given the multisystem involvement and presence of subcutaneous nodules, SLE could be a "can't miss" diagnosis due to its potential for rapid progression and involvement of critical organs such as the kidneys (lupus nephritis). However, the absence of specific symptoms or laboratory findings associated with SLE makes this less likely.Assessment: A 57 year old female with a complex medical history, including hyperlipidemia, benzodiazepine dependence, GERD, lumbago with sciatica, podagra, hypertension, generalized anxiety disorder, memory impairment, and lymphadenopathy, presents with unintentional weight loss and chronic subcutaneous nodules on her right neck and mid-back. The patient's medication regimen is comprehensive, addressing various aspects of her medical history. Laboratory findings are notable for a slightly elevated glucose level, while imaging studies reveal multiple hepatic cysts with a significant hemorrhagic cystic lesion but no other abnormalities. Her vital signs are stable.  The differential diagnosis for this patient includes sarcoidosis, given the chronic subcutaneous nodules and lymphadenopathy, despite the absence of pulmonary involvement or elevated serum calcium levels. Lymphoma is also considered due to  the chronic subcutaneous nodules, lymphadenopathy, and unintentional weight loss, although specific laboratory findings typically associated with lymphoma are not present. Benign hepatic cysts with secondary hemorrhage could explain the unintentional weight loss if abdominal discomfort led to decreased oral intake, but this does not account for the subcutaneous nodules or lymphadenopathy.  Plan: 1. Sarcoidosis:    - Dx: Obtain a chest X-ray and consider a high-resolution CT scan of the chest to evaluate for pulmonary involvement. Serum angiotensin-converting enzyme (ACE) levels and a Kveim test may also be helpful. Biopsy of one of the subcutaneous nodules or lymph nodes could provide a definitive diagnosis.    - Tx: If sarcoidosis is confirmed, management may include corticosteroids for systemic symptoms. Monitor for potential complications, including lung, eye, and liver involvement.  2. Lymphoma:    - Dx: Excisional biopsy of a subcutaneous nodule or lymph node for histopathological examination. Additional imaging, such as a PET scan, may be warranted to assess for systemic involvement.    - Tx: Treatment would be based on the specific type of lymphoma diagnosed and may include chemotherapy, radiation therapy, or a combination of both. Referral to an oncologist is essential.  3. Benign Hepatic Cysts with Secondary Hemorrhage:    - Dx: Further evaluation of the hepatic cysts may include serial imaging to monitor for changes in size or characteristics. Consideration of an MRI with a hepatobiliary-specific contrast agent for a more detailed assessment.    - Tx: Management typically involves monitoring unless the cysts cause significant symptoms or complications, in which case interventional radiology procedures or surgery may be indicated.  4. Expanded Differential Diagnoses:    - Rheumatoid  Arthritis (RA) with Rheumatoid Nodules: If joint symptoms develop or if rheumatoid factor/anti-CCP antibodies  are positive, consider this diagnosis.    - Infectious Etiologies: Although less likely, ensure up-to-date tuberculosis screening and consider atypical mycobacterial infection if risk factors are present.    - Granulomatosis with Polyangiitis (GPA): ANCA testing could be considered if symptoms suggestive of GPA develop.  5. Can't Miss Differential Diagnosis:    - Metastatic Cancer: Ensure thorough evaluation including biopsy of suspicious nodules.    - Infective Endocarditis: If new heart murmurs develop or if the patient has risk factors, echocardiography should be performed.    - Systemic Lupus Erythematosus (SLE): ANA and anti-dsDNA antibodies testing if new symptoms suggestive of SLE appear.  For each of these considerations, a multidisciplinary approach involving rheumatology, oncology, infectious disease, and potentially gastroenterology will be crucial for comprehensive care. Regular follow-up to reassess symptoms, monitor the effectiveness of treatments, and adjust the management plan as necessary is essential.   Cystic Disease of Liver   Patients chart review and interview were used to generate a prompt for artificial intelligence analysis ([employer] artificial intelligence) clinical decision support.  AI output was reviewed and is provided in red:   Upon analyzing the patient's presentation, several factors stand out: The presence of a hemorrhagic cystic lesion in the liver, which has shown changes over time. Significant unintentional weight loss. Chronic subcutaneous nodules and lymphadenopathy, suggesting a systemic process. Considering these factors, potential causes for the liver cysts include: Benign hepatic cysts, which are common and often asymptomatic, but the patient's significant weight loss and systemic symptoms suggest a need to explore additional or alternative diagnoses. Metastatic disease, given the weight loss and subcutaneous nodules, although the MRI findings favor a  benign process. Infections or parasitic diseases, which can cause liver cysts, but the patient's clinical presentation and laboratory findings do not strongly support this diagnosis. Autoimmune conditions or systemic diseases like sarcoidosis, which could explain the multi-system involvement including lymphadenopathy, subcutaneous nodules, and potentially liver cysts. Systematically Ensure Accuracy & Precision Re-evaluating the patient's presentation and the differential diagnosis, the benign nature of the hepatic cyst as suggested by imaging, combined with systemic symptoms like weight loss, subcutaneous nodules, and lymphadenopathy, points towards a systemic process that may not be directly related to the liver cyst. The liver cyst may be an incidental finding, with the systemic symptoms warranting a broader diagnostic approach. Given the lack of specific symptoms or laboratory findings pointing to a malignant or infectious cause for the liver cyst, and considering the patient's complex medical history, a systemic autoimmune or inflammatory condition could be a unifying diagnosis that requires further investigation. Final Answer The liver cysts in this patient, given the context of unintentional weight loss, chronic subcutaneous nodules, and lymphadenopathy, may be part of a broader systemic process rather than an isolated hepatic issue. While the MRI findings favor a benign hepatic cyst, the systemic symptoms suggest the need for further evaluation for systemic diseases, including autoimmune conditions or sarcoidosis. A multidisciplinary approach involving hepatology, rheumatology, and possibly oncology, depending on evolving findings, is recommended to comprehensively address the patient's symptoms and to ensure a holistic approach to her care.   MR ABDOMEN WWO CONTRAST  Result Date: 07/24/2022 CLINICAL DATA:  Liver lesion on ultrasound EXAM: MRI ABDOMEN WITHOUT AND WITH CONTRAST TECHNIQUE:  Multiplanar multisequence MR imaging of the abdomen was performed both before and after the administration of intravenous contrast. CONTRAST:  8mL GADAVIST GADOBUTROL 1 MMOL/ML IV SOLN COMPARISON:  Right upper  quadrant ultrasound dated 07/19/2022. CT abdomen/pelvis dated 07/13/2011. FINDINGS: Motion degraded images. Lower chest: Lung bases are clear. Hepatobiliary: No morphologic findings of cirrhosis. No hepatic steatosis. Multiple hepatic cysts, including a dominant 2.2 cm lobulated cyst adjacent to the falciform ligament (series 4/image 15). Additionally, there is a 2.2 cm hemorrhagic cystic lesion without enhancement in segment 2 (series 12/image 22), corresponding to the sonographic abnormality and in the same location as a 5.1 simple cyst in 2013, favoring hemorrhage/retraction of a benign hepatic cyst. No solid component or enhancement on postcontrast subtraction imaging. No follow-up is recommended. Gallbladder is unremarkable. No intrahepatic or extrahepatic duct dilatation. Pancreas:  Within normal limits. Spleen:  Within normal limits. Adrenals/Urinary Tract:  Adrenal glands are within normal limits. Kidneys are within normal limits.  No hydronephrosis. Stomach/Bowel: Stomach is within normal limits. Visualized bowel is unremarkable. Vascular/Lymphatic:  No evidence of abdominal aortic aneurysm. No suspicious abdominal lymphadenopathy. Other:  No abdominal ascites. Musculoskeletal: No focal osseous lesions. IMPRESSION: Motion degraded images. 2.2 cm hemorrhagic cyst in segment 2, corresponding to the sonographic abnormality, benign. No follow-up imaging is recommended. Multiple additional simple benign hepatic cysts, as above. No suspicious hepatic lesions. Electronically Signed   By: Charline Bills M.D.   On: 07/24/2022 19:56   US Abdomen Limited RUQ (LIVER/GB)  Result Date: 07/19/2022 CLINICAL DATA:  Elevated LFTs EXAM: ULTRASOUND ABDOMEN LIMITED RIGHT UPPER QUADRANT COMPARISON:  CT abdomen  pelvis 07/13/2011 FINDINGS: Gallbladder: No gallstones or wall thickening visualized. No sonographic Murphy sign noted by sonographer. Common bile duct: Diameter: 1.5 mm Liver: Mildly coarsened hepatic parenchymal echogenicity. Within the right hepatic lobe there is a 1.0 x 0.7 x 1.3 cm cyst. Within the left hepatic lobe there is a 0.8 x 0.6 x 1.1 cm septated cyst. Within the left hepatic lobe there is a 2.1 x 2.4 x 1.9 cm oval hypoechoic mass. Portal vein is patent on color Doppler imaging with normal direction of blood flow towards the liver. Other: None. IMPRESSION: 1. Indeterminate hypoechoic mass within the left hepatic lobe measuring up to 2.4 cm. Recommend further evaluation with pre and post contrast-enhanced abdominal MRI. 2. No cholelithiasis or sonographic evidence for acute cholecystitis. 3. These results will be called to the ordering clinician or representative by the Radiologist Assistant, and communication documented in the PACS or [employer]. Electronically Signed   By: Annia Belt M.D.   On: 07/19/2022 09:55                Clinical Data Analysis:   Physical Exam  BP 138/80 (BP Location: Right Arm, Patient Position: Sitting)   Pulse 98   Temp 98.3 F (36.8 C) (Temporal)   Ht 5' 3.5" (1.613 m)   Wt 122 lb (55.3 kg)   LMP 12/20/2011   SpO2 100%   BMI 21.27 kg/m  Wt Readings from Last 10 Encounters:  10/09/22 122 lb (55.3 kg)  09/25/22 129 lb 3.2 oz (58.6 kg)  04/18/21 175 lb 11.3 oz (79.7 kg)  05/19/18 173 lb (78.5 kg)   Vital signs reviewed.  Nursing notes reviewed. Weight trend reviewed. Abnormalities and Problem-Specific physical exam findings:  anxious General Appearance:  No acute distress appreciable.   Well-groomed, healthy-appearing female.  Well proportioned with no abnormal fat distribution.  Good muscle tone. Skin: Clear and well-hydrated. Pulmonary:  Normal work of breathing at rest, no respiratory distress apparent. SpO2: 100 %  Musculoskeletal: All  extremities are intact.  Neurological:  Awake, alert, oriented, and engaged.  No obvious focal  neurological deficits or cognitive impairments.  Sensorium seems unclouded. Speech is clear and coherent with logical content. Psychiatric:  Appropriate mood, pleasant and cooperative demeanor, cheerful and engaged during the exam   Additional Results Reviewed:     No results found for any visits on 10/09/22.  Recent Results (from the past 2160 hour(s))  Hepatitis C Antibody     Status: None   Collection Time: 09/25/22  9:53 AM  Result Value Ref Range   Hepatitis C Ab NON-REACTIVE NON-REACTIVE    Comment: . HCV antibody was non-reactive. There is no laboratory  evidence of HCV infection. . In most cases, no further action is required. However, if recent HCV exposure is suspected, a test for HCV RNA (test code 19147) is suggested. . For additional information please refer to http://education.[facility].com/faq/FAQ22v1 (This link is being provided for informational/ educational purposes only.) .   HIV antibody (with reflex)     Status: None   Collection Time: 09/25/22  9:53 AM  Result Value Ref Range   HIV 1&2 Ab, 4th Generation NON-REACTIVE NON-REACTIVE    Comment: HIV-1 antigen and HIV-1/HIV-2 antibodies were not detected. There is no laboratory evidence of HIV infection. Marland Kitchen PLEASE NOTE: This information has been disclosed to you from records whose confidentiality may be protected by state law.  If your state requires such protection, then the state law prohibits you from making any further disclosure of the information without the specific written consent of the person to whom it pertains, or as otherwise permitted by law. A general authorization for the release of medical or other information is NOT sufficient for this purpose. . For additional information please refer to http://education.[facility].com/faq/FAQ106 (This link is being provided for  informational/ educational purposes only.) . Marland Kitchen The performance of this assay has not been clinically validated in patients less than 41 years old. .   Lipid panel     Status: Abnormal   Collection Time: 09/25/22  9:53 AM  Result Value Ref Range   Cholesterol 221 (H) 0 - 200 mg/dL    Comment: ATP III Classification       Desirable:  < 200 mg/dL               Borderline High:  200 - 239 mg/dL          High:  > = 829 mg/dL   Triglycerides 562.1 0.0 - 149.0 mg/dL    Comment: Normal:  <308 mg/dLBorderline High:  150 - 199 mg/dL   HDL 65.78 >46.96 mg/dL   VLDL 29.5 0.0 - 28.4 mg/dL   LDL Cholesterol 132 (H) 0 - 99 mg/dL   Total CHOL/HDL Ratio 3     Comment:                Men          Women1/2 Average Risk     3.4          3.3Average Risk          5.0          4.42X Average Risk          9.6          7.13X Average Risk          15.0          11.0                       NonHDL 144.80     Comment: NOTE:  Non-HDL goal should be 30 mg/dL higher than patient's LDL goal (i.e. LDL goal of < 70 mg/dL, would have non-HDL goal of < 100 mg/dL)  CBC     Status: Abnormal   Collection Time: 09/25/22  9:53 AM  Result Value Ref Range   WBC 7.2 4.0 - 10.5 K/uL   RBC 3.96 3.87 - 5.11 Mil/uL   Platelets 423.0 (H) 150.0 - 400.0 K/uL   Hemoglobin 13.0 12.0 - 15.0 g/dL   HCT 96.2 22.9 - 79.8 %   MCV 93.8 78.0 - 100.0 fl   MCHC 34.9 30.0 - 36.0 g/dL   RDW 92.1 19.4 - 17.4 %  Comp Met (CMET)     Status: Abnormal   Collection Time: 09/25/22  9:53 AM  Result Value Ref Range   Sodium 138 135 - 145 mEq/L   Potassium 3.6 3.5 - 5.1 mEq/L   Chloride 98 96 - 112 mEq/L   CO2 30 19 - 32 mEq/L   Glucose, Bld 96 70 - 99 mg/dL   BUN 17 6 - 23 mg/dL   Creatinine, Ser 0.81 0.40 - 1.20 mg/dL   Total Bilirubin 0.7 0.2 - 1.2 mg/dL   Alkaline Phosphatase 47 39 - 117 U/L   AST 40 (H) 0 - 37 U/L   ALT 99 (H) 0 - 35 U/L   Total Protein 7.2 6.0 - 8.3 g/dL   Albumin 4.9 3.5 - 5.2 g/dL   GFR 44.81 >85.63 mL/min    Comment:  Calculated using the CKD-EPI Creatinine Equation (2021)   Calcium 10.1 8.4 - 10.5 mg/dL  Cyclic citrul peptide antibody, IgG     Status: None   Collection Time: 09/25/22  9:53 AM  Result Value Ref Range   Cyclic Citrullin Peptide Ab <14 UNITS    Comment: Reference Range Negative:            <20 Weak Positive:       20-39 Moderate Positive:   40-59 Strong Positive:     >59 .   Rheumatoid Factor     Status: None   Collection Time: 09/25/22  9:53 AM  Result Value Ref Range   Rheumatoid fact SerPl-aCnc <14 <14 IU/mL  ANA     Status: None   Collection Time: 09/25/22  9:53 AM  Result Value Ref Range   Anti Nuclear Antibody (ANA) NEGATIVE NEGATIVE    Comment: ANA IFA is a first line screen for detecting the presence of up to approximately 150 autoantibodies in various autoimmune diseases. A negative ANA IFA result suggests an ANA-associated autoimmune disease is not present at this time, but is not definitive. If there is high clinical suspicion for Sjogren's syndrome, testing for anti-SS-A/Ro antibody should be considered. Anti-Jo-1 antibody should be considered for clinically suspected inflammatory myopathies. . AC-0: Negative . International Consensus on ANA Patterns (SeverTies.uy) . For additional information, please refer to http://education.[facility].com/faq/FAQ177 (This link is being provided for informational/ educational purposes only.) .   VITAMIN D 25 Hydroxy (Vit-D Deficiency, Fractures)     Status: Abnormal   Collection Time: 09/25/22  9:53 AM  Result Value Ref Range   VITD 26.68 (L) 30.00 - 100.00 ng/mL  B12 and Folate Panel     Status: None   Collection Time: 09/25/22  9:53 AM  Result Value Ref Range   Vitamin B-12 306 211 - 911 pg/mL   Folate 11.2 >5.9 ng/mL  ANCA Screen Reflex Titer     Status: None   Collection Time: 09/25/22  9:53  AM  Result Value Ref Range   ANCA SCREEN Negative Negative    Comment: . ANCA Screen  includes evaluation for p-ANCA, c-ANCA and atypical p-ANCA. A positive ANCA screen reflexes to titer and pattern(s), e.g., cytoplasmic pattern (c-ANCA), perinuclear pattern (p-ANCA), or atypical p-ANCA pattern. c-ANCA and p-ANCA are observed in vasculitis, whereas atypical p-ANCA is observed in IBD (Inflammatory Bowel Disease). Atypical p-ANCA is detected in about 55% to 80% of patients with ulcerative colitis but only 5% to 25% of patients with Crohn's disease. .   Thyroid Panel With TSH     Status: None   Collection Time: 09/25/22  9:59 AM  Result Value Ref Range   T3 Uptake 33 22 - 35 %   T4, Total 7.7 5.1 - 11.9 mcg/dL   Free Thyroxine Index 2.5 1.4 - 3.8   TSH 0.63 0.40 - 4.50 mIU/L    No image results found.   MR ABDOMEN WWO CONTRAST  Result Date: 07/24/2022 CLINICAL DATA:  Liver lesion on ultrasound EXAM: MRI ABDOMEN WITHOUT AND WITH CONTRAST TECHNIQUE: Multiplanar multisequence MR imaging of the abdomen was performed both before and after the administration of intravenous contrast. CONTRAST:  8mL GADAVIST GADOBUTROL 1 MMOL/ML IV SOLN COMPARISON:  Right upper quadrant ultrasound dated 07/19/2022. CT abdomen/pelvis dated 07/13/2011. FINDINGS: Motion degraded images. Lower chest: Lung bases are clear. Hepatobiliary: No morphologic findings of cirrhosis. No hepatic steatosis. Multiple hepatic cysts, including a dominant 2.2 cm lobulated cyst adjacent to the falciform ligament (series 4/image 15). Additionally, there is a 2.2 cm hemorrhagic cystic lesion without enhancement in segment 2 (series 12/image 22), corresponding to the sonographic abnormality and in the same location as a 5.1 simple cyst in 2013, favoring hemorrhage/retraction of a benign hepatic cyst. No solid component or enhancement on postcontrast subtraction imaging. No follow-up is recommended. Gallbladder is unremarkable. No intrahepatic or extrahepatic duct dilatation. Pancreas:  Within normal limits. Spleen:  Within  normal limits. Adrenals/Urinary Tract:  Adrenal glands are within normal limits. Kidneys are within normal limits.  No hydronephrosis. Stomach/Bowel: Stomach is within normal limits. Visualized bowel is unremarkable. Vascular/Lymphatic:  No evidence of abdominal aortic aneurysm. No suspicious abdominal lymphadenopathy. Other:  No abdominal ascites. Musculoskeletal: No focal osseous lesions. IMPRESSION: Motion degraded images. 2.2 cm hemorrhagic cyst in segment 2, corresponding to the sonographic abnormality, benign. No follow-up imaging is recommended. Multiple additional simple benign hepatic cysts, as above. No suspicious hepatic lesions. Electronically Signed   By: Charline Bills M.D.   On: 07/24/2022 19:56   US Abdomen Limited RUQ (LIVER/GB)  Result Date: 07/19/2022 CLINICAL DATA:  Elevated LFTs EXAM: ULTRASOUND ABDOMEN LIMITED RIGHT UPPER QUADRANT COMPARISON:  CT abdomen pelvis 07/13/2011 FINDINGS: Gallbladder: No gallstones or wall thickening visualized. No sonographic Murphy sign noted by sonographer. Common bile duct: Diameter: 1.5 mm Liver: Mildly coarsened hepatic parenchymal echogenicity. Within the right hepatic lobe there is a 1.0 x 0.7 x 1.3 cm cyst. Within the left hepatic lobe there is a 0.8 x 0.6 x 1.1 cm septated cyst. Within the left hepatic lobe there is a 2.1 x 2.4 x 1.9 cm oval hypoechoic mass. Portal vein is patent on color Doppler imaging with normal direction of blood flow towards the liver. Other: None. IMPRESSION: 1. Indeterminate hypoechoic mass within the left hepatic lobe measuring up to 2.4 cm. Recommend further evaluation with pre and post contrast-enhanced abdominal MRI. 2. No cholelithiasis or sonographic evidence for acute cholecystitis. 3. These results will be called to the ordering clinician or representative by the  Printmaker, and communication documented in the PACS or [employer]. Electronically Signed   By: Annia Belt M.D.   On: 07/19/2022 09:55      --------------------------------    Signed: Lula Olszewski, MD 10/09/2022 8:23 PM

## 2022-10-09 NOTE — Assessment & Plan Note (Addendum)
Continue close follow up 1 month

## 2022-10-09 NOTE — Assessment & Plan Note (Addendum)
Started getting severe since starting mirtazapine but possibly unrelated to the medication(s)  I'm suspicious for malnutrition induced maybe vitamins or undetected dehydration... Seems reasonable given all the unintentional weight loss  Positive orthostatics in emergency room indicative of dehydration Overall I agree with emergency room evaluation. But needs more aggressive workup as outpatient which we setup today along with close follow up Advised patient to start aggressive multivitamin regimen, protein shakes, and increased fluid while we evaluate the pelvic and chest. Gastrointestinal and gynecology referrals pending with gynecology in May.

## 2022-10-10 ENCOUNTER — Ambulatory Visit
Admission: RE | Admit: 2022-10-10 | Discharge: 2022-10-10 | Disposition: A | Discharge status: Home / Self Care | Payer: Medicaid Other | Source: Ambulatory Visit | Attending: Internal Medicine | Admitting: Internal Medicine

## 2022-10-10 DIAGNOSIS — R634 Abnormal weight loss: Secondary | ICD-10-CM

## 2022-10-10 DIAGNOSIS — K7689 Other specified diseases of liver: Secondary | ICD-10-CM

## 2022-10-10 DIAGNOSIS — N950 Postmenopausal bleeding: Secondary | ICD-10-CM

## 2022-10-11 ENCOUNTER — Other Ambulatory Visit: Payer: Self-pay

## 2022-10-11 ENCOUNTER — Telehealth: Payer: Self-pay | Admitting: Internal Medicine

## 2022-10-11 ENCOUNTER — Ambulatory Visit: Payer: Medicaid Other | Admitting: Internal Medicine

## 2022-10-11 DIAGNOSIS — R233 Spontaneous ecchymoses: Secondary | ICD-10-CM

## 2022-10-11 DIAGNOSIS — K7689 Other specified diseases of liver: Secondary | ICD-10-CM | POA: Diagnosis not present

## 2022-10-11 DIAGNOSIS — N950 Postmenopausal bleeding: Secondary | ICD-10-CM

## 2022-10-11 DIAGNOSIS — R222 Localized swelling, mass and lump, trunk: Secondary | ICD-10-CM | POA: Diagnosis not present

## 2022-10-11 DIAGNOSIS — R079 Chest pain, unspecified: Secondary | ICD-10-CM

## 2022-10-11 DIAGNOSIS — R634 Abnormal weight loss: Secondary | ICD-10-CM | POA: Diagnosis not present

## 2022-10-11 NOTE — Telephone Encounter (Signed)
Patient states: - Since she can't go to [facility], she has found that [facility] takes her insurance  - Referral will need to be faxed to [facility]  [address] [place], [place] [address] Phone: [phone] Fax: [phone]

## 2022-10-11 NOTE — Telephone Encounter (Signed)
Patient states:  - Had US done @ [facility] yesterday; Was told could come back today to complete other orders - Got called this morning stating other orders need to be completed @ hospital   Informed pt I would call for clarification. I spoke with Verlon Au who in turn spoke with Jeanella Anton supervisor 2 the location. Per Marylou Flesher states that imaging has been ordered to complete there.   Angelique Blonder also stated imaging would need to be modified. States CT Chest wo contrast will not be needed due to CT Angio being done. Also CT Pelvis needs to be changed to CT Abdomen Pelvis w/ contrast.  Verlon Au has sent modified order to be signed off on by PCP.

## 2022-10-12 ENCOUNTER — Encounter: Payer: Self-pay | Admitting: Internal Medicine

## 2022-10-12 ENCOUNTER — Ambulatory Visit (HOSPITAL_BASED_OUTPATIENT_CLINIC_OR_DEPARTMENT_OTHER)
Admission: RE | Admit: 2022-10-12 | Discharge: 2022-10-12 | Disposition: A | Discharge status: Home / Self Care | Payer: Medicaid Other | Source: Ambulatory Visit | Attending: Internal Medicine | Admitting: Internal Medicine

## 2022-10-12 ENCOUNTER — Encounter (HOSPITAL_BASED_OUTPATIENT_CLINIC_OR_DEPARTMENT_OTHER): Payer: Self-pay

## 2022-10-12 ENCOUNTER — Other Ambulatory Visit: Payer: Self-pay | Admitting: Internal Medicine

## 2022-10-12 DIAGNOSIS — R079 Chest pain, unspecified: Secondary | ICD-10-CM

## 2022-10-12 DIAGNOSIS — R931 Abnormal findings on diagnostic imaging of heart and coronary circulation: Secondary | ICD-10-CM

## 2022-10-12 DIAGNOSIS — N950 Postmenopausal bleeding: Secondary | ICD-10-CM | POA: Diagnosis not present

## 2022-10-12 DIAGNOSIS — K7689 Other specified diseases of liver: Secondary | ICD-10-CM | POA: Diagnosis not present

## 2022-10-12 DIAGNOSIS — R634 Abnormal weight loss: Secondary | ICD-10-CM

## 2022-10-12 DIAGNOSIS — I700 Atherosclerosis of aorta: Secondary | ICD-10-CM

## 2022-10-12 DIAGNOSIS — R233 Spontaneous ecchymoses: Secondary | ICD-10-CM

## 2022-10-12 DIAGNOSIS — Z86010 Personal history of colonic polyps: Secondary | ICD-10-CM | POA: Insufficient documentation

## 2022-10-12 HISTORY — DX: Abnormal findings on diagnostic imaging of heart and coronary circulation: R93.1

## 2022-10-12 MED ORDER — IOHEXOL 350 MG/ML SOLN
100.0000 mL | Freq: Once | INTRAVENOUS | Status: AC | PRN
  Administered 2022-10-12: 75 mL via INTRAVENOUS

## 2022-10-17 ENCOUNTER — Other Ambulatory Visit: Payer: Self-pay | Admitting: Internal Medicine

## 2022-10-17 DIAGNOSIS — R5383 Other fatigue: Secondary | ICD-10-CM

## 2022-10-20 ENCOUNTER — Other Ambulatory Visit: Payer: Self-pay

## 2022-10-20 DIAGNOSIS — E785 Hyperlipidemia, unspecified: Secondary | ICD-10-CM

## 2022-10-20 MED ORDER — ATORVASTATIN CALCIUM 40 MG PO TABS
40.0000 mg | ORAL_TABLET | Freq: Every day | ORAL | 3 refills | Status: DC
Start: 2022-10-20 — End: 2022-12-05

## 2022-10-24 ENCOUNTER — Other Ambulatory Visit: Payer: Self-pay

## 2022-10-24 DIAGNOSIS — R634 Abnormal weight loss: Secondary | ICD-10-CM

## 2022-10-24 NOTE — Telephone Encounter (Signed)
Placed and faxed a new GI referral to [facility].

## 2022-10-24 NOTE — Telephone Encounter (Signed)
This was already taken care of previously.

## 2022-10-25 ENCOUNTER — Encounter: Payer: Self-pay | Admitting: Internal Medicine

## 2022-10-25 ENCOUNTER — Ambulatory Visit (INDEPENDENT_AMBULATORY_CARE_PROVIDER_SITE_OTHER): Payer: Medicaid Other | Admitting: Internal Medicine

## 2022-10-25 VITALS — BP 120/72 | HR 94 | Temp 98.3°F | Ht 63.5 in | Wt 125.4 lb

## 2022-10-25 DIAGNOSIS — R6881 Early satiety: Secondary | ICD-10-CM | POA: Diagnosis not present

## 2022-10-25 DIAGNOSIS — R1013 Epigastric pain: Secondary | ICD-10-CM | POA: Diagnosis not present

## 2022-10-25 DIAGNOSIS — M5135 Other intervertebral disc degeneration, thoracolumbar region: Secondary | ICD-10-CM | POA: Diagnosis not present

## 2022-10-25 DIAGNOSIS — M5440 Lumbago with sciatica, unspecified side: Secondary | ICD-10-CM

## 2022-10-25 DIAGNOSIS — R634 Abnormal weight loss: Secondary | ICD-10-CM

## 2022-10-25 DIAGNOSIS — R7401 Elevation of levels of liver transaminase levels: Secondary | ICD-10-CM | POA: Diagnosis not present

## 2022-10-25 DIAGNOSIS — R079 Chest pain, unspecified: Secondary | ICD-10-CM

## 2022-10-25 DIAGNOSIS — R42 Dizziness and giddiness: Secondary | ICD-10-CM | POA: Diagnosis not present

## 2022-10-25 DIAGNOSIS — Z419 Encounter for procedure for purposes other than remedying health state, unspecified: Secondary | ICD-10-CM | POA: Diagnosis not present

## 2022-10-25 HISTORY — DX: Other intervertebral disc degeneration, thoracolumbar region: M51.35

## 2022-10-25 MED ORDER — GABAPENTIN 300 MG PO CAPS
300.0000 mg | ORAL_CAPSULE | Freq: Three times a day (TID) | ORAL | 3 refills | Status: DC
Start: 2022-10-25 — End: 2023-10-24

## 2022-10-25 NOTE — Progress Notes (Signed)
[facility]: [phone]   Routine Medical Office Visit  Patient:  Amanda White      Age: 57 y.o.       Sex:  female  Date:   10/25/2022 PCP:    Lula Olszewski, MD   Today's Healthcare Provider: Lula Olszewski, MD       Assessment and Plan:   Unintentional weight loss Assessment & Plan: Reviewed chest CT images together with patient.  I assured her that the aortic atherosclerosis is pretty scattered all over and that I do think she needs to take the higher dose statin for this and I also showed her that her back is much worse than we knew and that they did not really talk about that much because that was not the reason for the CAT scan but I went ahead and listed that is a problem Advised patient I have no evidence for malignancy at this point but want to continue close follow up- although this could just be all due to some sort of chronic gastritis, need gastrointestinal evaluation next. Stabilized - up 3 pounds taking Remeron, continue with Remeron and trying to maintain wait Will see gastrointestinal about this and hepatic cysts later today Less concerned about nodules since specialist also felt benign- but we still might need biopsy later Denies fevers, cough, no evidence infection(s) anywhere Echocardiogram being planned to rule out infective endocarditis and evaluate CT abnormal. No evidence for sarcoidosis on CT or labs so artificial intelligence analysis preference for this diagnosis unlikely to be correct in my medical opinion  Will hold off on pet as long as she can maintain weight and until gastrointestinal analysis complete, reconsider in follow up 1 month  No really any joint pain except osteoarthritis in spine- so I don't see need to refer to rheumatoid at this time. Ok with plan not yet biopsy suspicious nodules from specialist shared decision making- I agree they aren't that suspicious.  Reviewed all the data here with patient:   Latest  Reference Range & Units 07/19/22 09:45 07/24/22 19:37 09/25/22 09:53 09/25/22 09:59 10/10/22 16:53 10/12/22 13:44  COMPREHENSIVE METABOLIC PANEL    Rpt !     Sodium 135 - 145 mEq/L   138     Potassium 3.5 - 5.1 mEq/L   3.6     Chloride 96 - 112 mEq/L   98     CO2 19 - 32 mEq/L   30     Glucose 70 - 99 mg/dL   96     BUN 6 - 23 mg/dL   17     Creatinine 0.98 - 1.20 mg/dL   1.19     Calcium 8.4 - 10.5 mg/dL   14.7     Alkaline Phosphatase 39 - 117 U/L   47     Albumin 3.5 - 5.2 g/dL   4.9     AST 0 - 37 U/L   40 (H)     ALT 0 - 35 U/L   99 (H)     Total Protein 6.0 - 8.3 g/dL   7.2     Total Bilirubin 0.2 - 1.2 mg/dL   0.7     GFR >82.95 mL/min   97.85     Total CHOL/HDL Ratio    3     Cholesterol 0 - 200 mg/dL   621 (H)     HDL Cholesterol >39.00 mg/dL   30.86     LDL (calc) 0 - 99  mg/dL   161 (H)     NonHDL    144.80     Triglycerides 0.0 - 149.0 mg/dL   096.0     VLDL 0.0 - 40.0 mg/dL   45.4     Folate >0.9 ng/mL   11.2     VITD 30.00 - 100.00 ng/mL   26.68 (L)     Vitamin B12 211 - 911 pg/mL   306     WBC 4.0 - 10.5 K/uL   7.2     RBC 3.87 - 5.11 Mil/uL   3.96     Hemoglobin 12.0 - 15.0 g/dL   81.1     HCT 91.4 - 78.2 %   37.1     MCV 78.0 - 100.0 fl   93.8     MCHC 30.0 - 36.0 g/dL   95.6     RDW 21.3 - 08.6 %   13.0     Platelets 150.0 - 400.0 K/uL   423.0 (H)     TSH 0.40 - 4.50 mIU/L    0.63    Thyroxine (T4) 5.1 - 11.9 mcg/dL    7.7    Free Thyroxine Index 1.4 - 3.8     2.5    T3 Uptake 22 - 35 %    33    Anti Nuclear Antibody (ANA) NEGATIVE    NEGATIVE     ANCA SCREEN Negative    Negative     Cyclic Citrullin Peptide Ab UNITS   <16     RA Latex Turbid. <14 IU/mL   <14     Hepatitis C Ab NON-REACTIVE    NON-REACTIVE     HIV NON-REACTIVE    NON-REACTIVE     CT ABDOMEN PELVIS W CONTRAST       Rpt  CT ANGIO CHEST PE W/CM &/OR WO CM       Rpt  MR ABDOMEN W WO CONTRAST   Rpt      US ABDOMEN LIMITED RUQ  Rpt       US PELVIC COMPLETE WITH TRANSVAGINAL      Rpt   !:  Data is abnormal (H): Data is abnormally high (L): Data is abnormally low Rpt: View report in Results Review for more information    Orders: -     MR BRAIN W WO CONTRAST; Future  DDD (degenerative disc disease), thoracolumbar Assessment & Plan: I plan to put this off as a finding until we have sorted out the much more concerning issue of her recent weight loss but we will bring her back in a few more weeks and maybe will get to it then if we have sorted out the other problem   Right-sided chest pain Assessment & Plan: Negative CT angiogram but worsening since- blood clot seems unlikely given the negative CT angiogram and duration, and cardiac pain unlikely due to diamond classification and nonsmoker Mostly I suspect its due to thoracic degenerative disk disease that I saw on CT angiogram. Tried treating the pain by increasing gabapentin for this reason. Advised patient to call and ask for sooner appointment with cardiology, go to emergency room if severe Planned to go ahead and get CT coronary calcium score in meantime due to peripheral arterial disease found and high cholesterol  Orders: -     CT CARDIAC SCORING (SELF PAY ONLY); Future  Lumbago of lumbar region with sciatica -     Gabapentin; Take 1 capsule (300 mg total) by mouth 3 (three) times daily.  Dispense: 270 capsule; Refill: 3  Dizziness Assessment & Plan: orthostatis  Orders: -     Ambulatory referral to Neurology -     MR BRAIN W WO CONTRAST; Future       Treatment plan discussed and reviewed in detail. Explained medication safety and potential side effects. Agreed on patient returning to office if symptoms worsen, persist, or new symptoms develop. Discussed precautions in case of needing to visit the Emergency Department. Answered all patient questions and confirmed understanding and comfort with the plan. Encouraged patient to contact our office if they have any questions or concerns.         Clinical  Presentation:   57 y.o. female here today for 1 month follow-up, Unintentional weight loss (Reason for visit is close follow up for this/Wt Readings from Last 5 Encounters:/10/25/22 : 125 lb 6.4 oz (56.9 kg)/10/09/22 : 122 lb (55.3 kg)/09/25/22 : 129 lb 3.2 oz (58.6 kg)/04/18/21 : 175 lb 11.3 oz (79.7 kg)/05/19/18 : 173 lb (78.5 kg)//), and Dizziness (When laying on back or on right side. 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 Has to take deep breaths to be able to breathe. At 10/03/22 emergency room visit: 57 year old female presents emergency department with epigastric abdominal pain that was first noticed last night. Patient endorses that around 1 AM epigastric pain became worse, with associated nausea without vomiting or diarrhea. Patient also states that she has had unintentional weight loss that has been ongoing since May 2023, previously seen in this facility roughly x 1 month ago. EKG and MRI and lab review negative for cause. Released.  Orthostatics were positive Attributed as possibly due to remeron   Right-Sided Chest Pain   Echocardiogram recommended by CT. thoracic degenerative disk disease seen on 09/2022 CT angiogram chest could explain  Right upper chest for "some time" Lately worsened Cardiology planned for July Known bad cholesterol AND peripheral arterial disease from CT chest angio 09/2022    Unintentional Weight Loss   Interim history: completed CT chest/abdomen/pelvic and pelvic  ultrasound with no findings concerning for malignancy since last visit Taking remeron and forcing food gained 3 pounds since last visit  Has gastrointestinal referral soon Denies any heartburn, prior abdomen/flank pain gone, just some right upper chest pain now   Wt Readings from Last 5 Encounters:  10/25/22 125 lb 6.4 oz (56.9 kg)  10/09/22 122 lb (55.3 kg)  09/25/22 129 lb 3.2 oz (58.6 kg)  04/18/21 175 lb 11.3 oz (79.7 kg)  05/19/18 173 lb (78.5 kg)  50 pounds 1 year unintentional weight loss at age 38 is worrisome. Interim history:  Endoscopy consultation planned for later today chronic subcutaneous nodule R neck and right flank seen by ENT who reassured offered biopsy shared decision making decided to hold off due to low risk Still has no pulmonary symptom(s) and CT chest was negative for tumor Patient reports stopping omeprazole and Nexium and has Pepcid complete and not having  to use Right upper quadrant of abdomen pain with hemorrhagic liver cysts on MRI.  This resolved after adding Pepcid complete and she stopped Nexium.    Prior history:  On 09/25/22  weight loss was associated with poorly controlled gastroesophageal reflux disease despite Nexium Remeron trial given and she is taking now and having more dizziness since starting it but weight stabilizing 09/25/22 referred to gastrointestinal and surgery input pending Massive lab workup negative CT of chest/abdomen/pelvis negative 09/2022  09/25/22 chart review and interview were used to generate a prompt for artificial intelligence analysis ([employer] artificial intelligence) clinical decision support.  AI output was reviewed and is provided in red:  Clinical Problem Representation: A 57 year old female with a complex medical history including hyperlipidemia, benzodiazepine dependence, GERD, lumbago with sciatica, podagra, hypertension, generalized anxiety disorder, memory impairment, and lymphadenopathy presents with  unintentional weight loss and chronic subcutaneous nodules on her right neck and mid-back. Her medications include a range of treatments for her conditions, and her laboratory findings are notable for a slightly elevated glucose level. Imaging studies reveal multiple hepatic cysts with a significant hemorrhagic cystic lesion but no other abnormalities. Her vital signs are stable.   Most Likely Diagnoses: - Sarcoidosis: The presence of chronic subcutaneous nodules, lymphadenopathy, and unintentional weight loss could suggest sarcoidosis, a multisystem granulomatous disease. The patient's symptoms and findings, including the subcutaneous nodules which could represent cutaneous sarcoidosis, and lymphadenopathy, are consistent with this diagnosis. However, the absence of pulmonary involvement or elevated serum calcium levels, common in sarcoidosis, are not mentioned.  - Lymphoma: Given the chronic subcutaneous nodules, lymphadenopathy, and unintentional weight loss, lymphoma is a consideration. The patient's systemic symptoms and the presence of lymphadenopathy could be indicative of lymphoma. However, the lack of specific laboratory findings such as elevated lactate dehydrogenase (LDH) or abnormal complete blood count (CBC) findings typically associated with lymphoma are not present.  Assessment: A 57 year old female with a complex medical history, including hyperlipidemia, benzodiazepine dependence, GERD, lumbago with sciatica, podagra, hypertension, generalized anxiety disorder, memory impairment, and lymphadenopathy, presents with unintentional weight loss and chronic subcutaneous nodules on her right neck and mid-back. The patient's medication regimen is comprehensive, addressing various aspects of her medical history. Laboratory findings are notable for a slightly elevated glucose level, while imaging studies reveal multiple hepatic cysts with a significant hemorrhagic cystic lesion but no other  abnormalities. Her vital signs are stable.  The differential diagnosis for this patient includes sarcoidosis, given the chronic subcutaneous nodules and lymphadenopathy, despite the absence of pulmonary involvement or elevated serum calcium levels. Lymphoma is also considered due to the chronic subcutaneous nodules, lymphadenopathy, and unintentional weight loss, although specific laboratory findings typically associated with lymphoma are not present. Benign hepatic cysts with secondary hemorrhage could explain the unintentional weight loss if abdominal discomfort led to decreased oral intake, but this does not account for the subcutaneous nodules or lymphadenopathy.  Plan: 1. Sarcoidosis:    - Dx: Obtain a chest X-ray and consider a high-resolution CT scan of the chest to evaluate for pulmonary involvement. Serum angiotensin-converting enzyme (ACE) levels and a Kveim test may also be helpful. Biopsy of one of the subcutaneous nodules or lymph nodes could provide a definitive diagnosis.    - Tx: If sarcoidosis is confirmed, management may include corticosteroids for systemic symptoms. Monitor for potential complications, including lung, eye, and liver involvement.  2. Lymphoma:    - Dx: Excisional biopsy of a subcutaneous nodule or lymph node for histopathological examination. Additional imaging, such as a  PET scan, may be warranted to assess for systemic involvement.    - Tx: Treatment would be based on the specific type of lymphoma diagnosed and may include chemotherapy, radiation therapy, or a combination of both. Referral to an oncologist is essential.  3. Benign Hepatic Cysts with Secondary Hemorrhage:    - Dx: Further evaluation of the hepatic cysts may include serial imaging to monitor for changes in size or characteristics. Consideration of an MRI with a hepatobiliary-specific contrast agent for a more detailed assessment.    - Tx: Management typically involves monitoring unless the cysts  cause significant symptoms or complications, in which case interventional radiology procedures or surgery may be indicated.  4. Expanded Differential Diagnoses:    - Rheumatoid Arthritis (RA) with Rheumatoid Nodules: If joint symptoms develop or if rheumatoid factor/anti-CCP antibodies are positive, consider this diagnosis.    - Infectious Etiologies: Although less likely, ensure up-to-date tuberculosis screening and consider atypical mycobacterial infection if risk factors are present.    - Granulomatosis with Polyangiitis (GPA): ANCA testing could be considered if symptoms suggestive of GPA develop.  5. Can't Miss Differential Diagnosis:    - Metastatic Cancer: Ensure thorough evaluation including biopsy of suspicious nodules.    - Infective Endocarditis: If new heart murmurs develop or if the patient has risk factors, echocardiography should be performed.    - Systemic Lupus Erythematosus (SLE): ANA and anti-dsDNA antibodies testing if new symptoms suggestive of SLE appear.  For each of these considerations, a multidisciplinary approach involving rheumatology, oncology, infectious disease, and potentially gastroenterology will be crucial for comprehensive care. Regular follow-up to reassess symptoms, monitor the effectiveness of treatments, and adjust the management plan as necessary is essential.  - Benign Hepatic Cysts with Secondary Hemorrhage: The MRI findings of multiple hepatic cysts, including a hemorrhagic cystic lesion, could explain the unintentional weight loss if the patient experienced abdominal discomfort leading to decreased oral intake. However, this diagnosis does not account for the subcutaneous nodules or lymphadenopathy.   Expanded Differential Diagnoses: - Rheumatoid Arthritis (RA) with Rheumatoid Nodules: Considering the patient's chronic subcutaneous nodules and systemic symptoms, RA with rheumatoid nodules could be a possibility, especially if the nodules are located in  typical areas for rheumatoid nodules. The absence of joint symptoms or positive rheumatoid factor/anti-CCP antibodies in the provided information makes this less likely.  - Infectious Etiologies (e.g., Tuberculosis, Atypical Mycobacterial Infection): Given the chronic nature of the subcutaneous nodules and lymphadenopathy, infectious etiologies such as tuberculosis or atypical mycobacterial infection could be considered, especially if there's a relevant exposure history. The lack of fever, night sweats, or a clear exposure history in the provided information makes this less likely.  - Granulomatosis with Polyangiitis (GPA): GPA could present with weight loss, subcutaneous nodules, and systemic symptoms. However, the absence of respiratory or renal symptoms commonly associated with GPA makes this diagnosis less likely.   Can't Miss Differential Diagnosis: - Metastatic Cancer: While not directly indicated by the provided information, the unintentional weight loss and presence of subcutaneous nodules could potentially indicate an underlying metastatic cancer. Further investigation, including biopsy of the nodules, would be necessary to rule this out.  - Infective Endocarditis: Although less likely without specific symptoms such as fever or new heart murmurs, the chronic use of medications and the presence of subcutaneous nodules (which could be Osler's nodes) necessitate consideration of infective endocarditis, especially if the patient has any risk factors such as intravenous drug use or pre-existing valvular heart disease.  - Systemic Lupus Erythematosus (  SLE): Given the multisystem involvement and presence of subcutaneous nodules, SLE could be a "can't miss" diagnosis due to its potential for rapid progression and involvement of critical organs such as the kidneys (lupus nephritis). However, the absence of specific symptoms or laboratory findings associated with SLE makes this less likely.Assessment: A  57 year old female with a complex medical history, including hyperlipidemia, benzodiazepine dependence, GERD, lumbago with sciatica, podagra, hypertension, generalized anxiety disorder, memory impairment, and lymphadenopathy, presents with unintentional weight loss and chronic subcutaneous nodules on her right neck and mid-back. The patient's medication regimen is comprehensive, addressing various aspects of her medical history. Laboratory findings are notable for a slightly elevated glucose level, while imaging studies reveal multiple hepatic cysts with a significant hemorrhagic cystic lesion but no other abnormalities. Her vital signs are stable.  The differential diagnosis for this patient includes sarcoidosis, given the chronic subcutaneous nodules and lymphadenopathy, despite the absence of pulmonary involvement or elevated serum calcium levels. Lymphoma is also considered due to the chronic subcutaneous nodules, lymphadenopathy, and unintentional weight loss, although specific laboratory findings typically associated with lymphoma are not present. Benign hepatic cysts with secondary hemorrhage could explain the unintentional weight loss if abdominal discomfort led to decreased oral intake, but this does not account for the subcutaneous nodules or lymphadenopathy.  Plan: 1. Sarcoidosis:    - Dx: Obtain a chest X-ray and consider a high-resolution CT scan of the chest to evaluate for pulmonary involvement. Serum angiotensin-converting enzyme (ACE) levels and a Kveim test may also be helpful. Biopsy of one of the subcutaneous nodules or lymph nodes could provide a definitive diagnosis.    - Tx: If sarcoidosis is confirmed, management may include corticosteroids for systemic symptoms. Monitor for potential complications, including lung, eye, and liver involvement.  2. Lymphoma:    - Dx: Excisional biopsy of a subcutaneous nodule or lymph node for histopathological examination. Additional imaging, such as  a PET scan, may be warranted to assess for systemic involvement.    - Tx: Treatment would be based on the specific type of lymphoma diagnosed and may include chemotherapy, radiation therapy, or a combination of both. Referral to an oncologist is essential.  3. Benign Hepatic Cysts with Secondary Hemorrhage:    - Dx: Further evaluation of the hepatic cysts may include serial imaging to monitor for changes in size or characteristics. Consideration of an MRI with a hepatobiliary-specific contrast agent for a more detailed assessment.    - Tx: Management typically involves monitoring unless the cysts cause significant symptoms or complications, in which case interventional radiology procedures or surgery may be indicated.  4. Expanded Differential Diagnoses:    - Rheumatoid Arthritis (RA) with Rheumatoid Nodules: If joint symptoms develop or if rheumatoid factor/anti-CCP antibodies are positive, consider this diagnosis.    - Infectious Etiologies: Although less likely, ensure up-to-date tuberculosis screening and consider atypical mycobacterial infection if risk factors are present.    - Granulomatosis with Polyangiitis (GPA): ANCA testing could be considered if symptoms suggestive of GPA develop.  5. Can't Miss Differential Diagnosis:    - Metastatic Cancer: Ensure thorough evaluation including biopsy of suspicious nodules.    - Infective Endocarditis: If new heart murmurs develop or if the patient has risk factors, echocardiography should be performed.    - Systemic Lupus Erythematosus (SLE): ANA and anti-dsDNA antibodies testing if new symptoms suggestive of SLE appear.  For each of these considerations, a multidisciplinary approach involving rheumatology, oncology, infectious disease, and potentially gastroenterology will be crucial for comprehensive care.  Regular follow-up to reassess symptoms, monitor the effectiveness of treatments, and adjust the management plan as necessary is essential.       Reviewed chart data: Active Ambulatory Problems    Diagnosis Date Noted   Hyperlipidemia 09/25/2022   Benzodiazepine dependence (HCC) 09/25/2022   GERD (gastroesophageal reflux disease) 09/25/2022   Lumbago of lumbar region with sciatica 09/25/2022   Podagra 09/25/2022   Hypertension 09/25/2022   Generalized anxiety disorder 09/25/2022   Memory impairment 09/25/2022   Subcutaneous nodule 09/25/2022   Unintentional weight loss 09/25/2022   RUQ abdominal tenderness 09/25/2022   Cystic disease of liver 09/25/2022   Dizziness 10/09/2022   Right-sided chest pain 10/09/2022   Cyst of gallbladder 10/09/2022   Aortic atherosclerosis (HCC) 10/12/2022   Abnormal cardiac CT angiography 10/12/2022   History of colon polyps 10/12/2022   DDD (degenerative disc disease), thoracolumbar 10/25/2022   Resolved Ambulatory Problems    Diagnosis Date Noted   Carpal tunnel syndrome 09/25/2022   Past Medical History:  Diagnosis Date   Anxiety    Bipolar 1 disorder (HCC)    Depression    Depression    Migraine     Outpatient Medications Prior to Visit  Medication Sig   atorvastatin (LIPITOR) 40 MG tablet Take 1 tablet (40 mg total) by mouth daily.   clonazePAM (KLONOPIN) 2 MG tablet Take 2 mg by mouth at bedtime.    cyclobenzaprine (FLEXERIL) 10 MG tablet Take 1 tablet (10 mg total) by mouth daily as needed for muscle spasms.   esomeprazole (NEXIUM) 20 MG capsule Take 1 capsule (20 mg total) by mouth daily at 6 (six) AM.   famotidine-calcium carbonate-magnesium hydroxide (PEPCID COMPLETE) 10-800-165 MG chewable tablet Chew 1 tablet by mouth daily as needed.   ibuprofen (ADVIL) 200 MG tablet Take 3 tablets (600 mg total) by mouth every 6 (six) hours.   lisinopril-hydrochlorothiazide (PRINZIDE,ZESTORETIC) 10-12.5 MG tablet Take 1 tablet by mouth daily.   mirtazapine (REMERON) 7.5 MG tablet TAKE 1 TABLET BY MOUTH AT BEDTIME.   ondansetron (ZOFRAN ODT) 4 MG disintegrating tablet Take 1 tablet (4  mg total) by mouth every 8 (eight) hours as needed for nausea or vomiting.   promethazine (PHENERGAN) 25 MG suppository Place 1 suppository (25 mg total) rectally every 6 (six) hours as needed for nausea or vomiting.   [DISCONTINUED] gabapentin (NEURONTIN) 300 MG capsule Take by mouth.   No facility-administered medications prior to visit.         Clinical Data Analysis:   Physical Exam  BP 120/72 (BP Location: Left Arm, Patient Position: Sitting)   Pulse 94   Temp 98.3 F (36.8 C) (Temporal)   Ht 5' 3.5" (1.613 m)   Wt 125 lb 6.4 oz (56.9 kg)   LMP 12/20/2011   SpO2 99%   BMI 21.87 kg/m  Wt Readings from Last 10 Encounters:  10/25/22 125 lb 6.4 oz (56.9 kg)  10/09/22 122 lb (55.3 kg)  09/25/22 129 lb 3.2 oz (58.6 kg)  04/18/21 175 lb 11.3 oz (79.7 kg)  05/19/18 173 lb (78.5 kg)   Vital signs reviewed.  Nursing notes reviewed. Weight trend reviewed. Abnormalities and Problem-Specific physical exam findings:  gait is smooth and coordinated. No tremor. Seems very clear-headed and memory and understanding of complex workup seems excellent. General Appearance:  No acute distress appreciable.   Well-groomed, healthy-appearing female.  Well proportioned with no abnormal fat distribution.  Good muscle tone. Skin: Clear and well-hydrated. Pulmonary:  Normal work of breathing at rest,  no respiratory distress apparent. SpO2: 99 %  Musculoskeletal: All extremities are intact.  Neurological:  Awake, alert, oriented, and engaged.  No obvious focal neurological deficits or cognitive impairments.  Sensorium seems unclouded.   Speech is clear and coherent with logical content. Psychiatric:  Appropriate mood, pleasant and cooperative demeanor, cheerful and engaged during the exam   Additional Results Reviewed:     No results found for any visits on 10/25/22.  Recent Results (from the past 2160 hour(s))  Hepatitis C Antibody     Status: None   Collection Time: 09/25/22  9:53 AM  Result  Value Ref Range   Hepatitis C Ab NON-REACTIVE NON-REACTIVE    Comment: . HCV antibody was non-reactive. There is no laboratory  evidence of HCV infection. . In most cases, no further action is required. However, if recent HCV exposure is suspected, a test for HCV RNA (test code 16109) is suggested. . For additional information please refer to http://education.[facility].com/faq/FAQ22v1 (This link is being provided for informational/ educational purposes only.) .   HIV antibody (with reflex)     Status: None   Collection Time: 09/25/22  9:53 AM  Result Value Ref Range   HIV 1&2 Ab, 4th Generation NON-REACTIVE NON-REACTIVE    Comment: HIV-1 antigen and HIV-1/HIV-2 antibodies were not detected. There is no laboratory evidence of HIV infection. Marland Kitchen PLEASE NOTE: This information has been disclosed to you from records whose confidentiality may be protected by state law.  If your state requires such protection, then the state law prohibits you from making any further disclosure of the information without the specific written consent of the person to whom it pertains, or as otherwise permitted by law. A general authorization for the release of medical or other information is NOT sufficient for this purpose. . For additional information please refer to http://education.[facility].com/faq/FAQ106 (This link is being provided for informational/ educational purposes only.) . Marland Kitchen The performance of this assay has not been clinically validated in patients less than 52 years old. .   Lipid panel     Status: Abnormal   Collection Time: 09/25/22  9:53 AM  Result Value Ref Range   Cholesterol 221 (H) 0 - 200 mg/dL    Comment: ATP III Classification       Desirable:  < 200 mg/dL               Borderline High:  200 - 239 mg/dL          High:  > = 604 mg/dL   Triglycerides 540.9 0.0 - 149.0 mg/dL    Comment: Normal:  <811 mg/dLBorderline High:  150 - 199 mg/dL   HDL 91.47 >82.95  mg/dL   VLDL 62.1 0.0 - 30.8 mg/dL   LDL Cholesterol 657 (H) 0 - 99 mg/dL   Total CHOL/HDL Ratio 3     Comment:                Men          Women1/2 Average Risk     3.4          3.3Average Risk          5.0          4.42X Average Risk          9.6          7.13X Average Risk          15.0          11.0  NonHDL 144.80     Comment: NOTE:  Non-HDL goal should be 30 mg/dL higher than patient's LDL goal (i.e. LDL goal of < 70 mg/dL, would have non-HDL goal of < 100 mg/dL)  CBC     Status: Abnormal   Collection Time: 09/25/22  9:53 AM  Result Value Ref Range   WBC 7.2 4.0 - 10.5 K/uL   RBC 3.96 3.87 - 5.11 Mil/uL   Platelets 423.0 (H) 150.0 - 400.0 K/uL   Hemoglobin 13.0 12.0 - 15.0 g/dL   HCT 16.1 09.6 - 04.5 %   MCV 93.8 78.0 - 100.0 fl   MCHC 34.9 30.0 - 36.0 g/dL   RDW 40.9 81.1 - 91.4 %  Comp Met (CMET)     Status: Abnormal   Collection Time: 09/25/22  9:53 AM  Result Value Ref Range   Sodium 138 135 - 145 mEq/L   Potassium 3.6 3.5 - 5.1 mEq/L   Chloride 98 96 - 112 mEq/L   CO2 30 19 - 32 mEq/L   Glucose, Bld 96 70 - 99 mg/dL   BUN 17 6 - 23 mg/dL   Creatinine, Ser 7.82 0.40 - 1.20 mg/dL   Total Bilirubin 0.7 0.2 - 1.2 mg/dL   Alkaline Phosphatase 47 39 - 117 U/L   AST 40 (H) 0 - 37 U/L   ALT 99 (H) 0 - 35 U/L   Total Protein 7.2 6.0 - 8.3 g/dL   Albumin 4.9 3.5 - 5.2 g/dL   GFR 95.62 >13.08 mL/min    Comment: Calculated using the CKD-EPI Creatinine Equation (2021)   Calcium 10.1 8.4 - 10.5 mg/dL  Cyclic citrul peptide antibody, IgG     Status: None   Collection Time: 09/25/22  9:53 AM  Result Value Ref Range   Cyclic Citrullin Peptide Ab <65 UNITS    Comment: Reference Range Negative:            <20 Weak Positive:       20-39 Moderate Positive:   40-59 Strong Positive:     >59 .   Rheumatoid Factor     Status: None   Collection Time: 09/25/22  9:53 AM  Result Value Ref Range   Rheumatoid fact SerPl-aCnc <14 <14 IU/mL  ANA     Status: None    Collection Time: 09/25/22  9:53 AM  Result Value Ref Range   Anti Nuclear Antibody (ANA) NEGATIVE NEGATIVE    Comment: ANA IFA is a first line screen for detecting the presence of up to approximately 150 autoantibodies in various autoimmune diseases. A negative ANA IFA result suggests an ANA-associated autoimmune disease is not present at this time, but is not definitive. If there is high clinical suspicion for Sjogren's syndrome, testing for anti-SS-A/Ro antibody should be considered. Anti-Jo-1 antibody should be considered for clinically suspected inflammatory myopathies. . AC-0: Negative . International Consensus on ANA Patterns (SeverTies.uy) . For additional information, please refer to http://education.[facility].com/faq/FAQ177 (This link is being provided for informational/ educational purposes only.) .   VITAMIN D 25 Hydroxy (Vit-D Deficiency, Fractures)     Status: Abnormal   Collection Time: 09/25/22  9:53 AM  Result Value Ref Range   VITD 26.68 (L) 30.00 - 100.00 ng/mL  B12 and Folate Panel     Status: None   Collection Time: 09/25/22  9:53 AM  Result Value Ref Range   Vitamin B-12 306 211 - 911 pg/mL   Folate 11.2 >5.9 ng/mL  ANCA Screen Reflex Titer  Status: None   Collection Time: 09/25/22  9:53 AM  Result Value Ref Range   ANCA SCREEN Negative Negative    Comment: . ANCA Screen includes evaluation for p-ANCA, c-ANCA and atypical p-ANCA. A positive ANCA screen reflexes to titer and pattern(s), e.g., cytoplasmic pattern (c-ANCA), perinuclear pattern (p-ANCA), or atypical p-ANCA pattern. c-ANCA and p-ANCA are observed in vasculitis, whereas atypical p-ANCA is observed in IBD (Inflammatory Bowel Disease). Atypical p-ANCA is detected in about 55% to 80% of patients with ulcerative colitis but only 5% to 25% of patients with Crohn's disease. .   Thyroid Panel With TSH     Status: None   Collection Time: 09/25/22  9:59 AM   Result Value Ref Range   T3 Uptake 33 22 - 35 %   T4, Total 7.7 5.1 - 11.9 mcg/dL   Free Thyroxine Index 2.5 1.4 - 3.8   TSH 0.63 0.40 - 4.50 mIU/L    No image results found.   CT Abdomen Pelvis W Contrast  Result Date: 10/12/2022 CLINICAL DATA:  Right-sided chest pain with positive D-dimer levels. Pulmonary embolism suspected, low to intermediate probability. Unintentional weight loss with bruising and vaginal spotting. Evaluate for malignancy. EXAM: CT ANGIOGRAPHY CHEST CT ABDOMEN AND PELVIS WITH CONTRAST TECHNIQUE: Multidetector CT imaging of the chest was performed using the standard protocol during bolus administration of intravenous contrast. Multiplanar CT image reconstructions and MIPs were obtained to evaluate the vascular anatomy. Multidetector CT imaging of the abdomen and pelvis was performed using the standard protocol during bolus administration of intravenous contrast. RADIATION DOSE REDUCTION: This exam was performed according to the departmental dose-optimization program which includes automated exposure control, adjustment of the mA and/or kV according to patient size and/or use of iterative reconstruction technique. CONTRAST:  75mL OMNIPAQUE IOHEXOL 350 MG/ML SOLN COMPARISON:  Chest radiographs 05/19/2018. Abdominal CT 07/13/2011, abdominal MRI 07/24/2022 and pelvic ultrasound 10/10/2022. FINDINGS: CTA CHEST FINDINGS Cardiovascular: The pulmonary arteries are well opacified with contrast to the level of the subsegmental branches. There is no evidence of acute pulmonary embolism. No acute systemic arterial abnormalities are identified. There is minimal aortic atherosclerosis. 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 There is a small amount of intermediate see pericardial fluid versus thickening. The heart size is normal. Mediastinum/Nodes: There are no enlarged mediastinal, hilar or axillary lymph nodes. The thyroid gland, trachea and esophagus demonstrate no  significant findings. Lungs/Pleura: No pleural effusion or pneumothorax. The lungs are clear. Musculoskeletal/Chest wall: No chest wall mass or suspicious osseous findings. Mild thoracic spondylosis. CT ABDOMEN AND PELVIS FINDINGS Hepatobiliary: The liver is normal in density without suspicious focal abnormality. Multiple hepatic cysts are again demonstrated. Some of these have enlarged from the previous examinations, although the largest remaining cyst in the left hepatic lobe has decreased in size from the remote CT, currently measuring 2.4 cm on image 9/7 (previously 4.8 cm). Current size is similar to recent MRI. No evidence of gallstones, gallbladder wall thickening or biliary dilatation. Pancreas: Unremarkable. No pancreatic ductal dilatation or surrounding inflammatory changes. Spleen: Normal in size without focal abnormality. Adrenals/Urinary Tract: Both adrenal glands appear normal. No evidence of urinary tract calculus, suspicious renal lesion or hydronephrosis. The bladder appears normal for its degree of distention. Stomach/Bowel: No enteric contrast administered. The stomach appears unremarkable for its degree of distension. No evidence of bowel wall thickening, distention or surrounding inflammatory change. The appendix appears normal. Mildly prominent stool in the proximal colon. Vascular/Lymphatic: There are no enlarged abdominal or pelvic lymph nodes.  Mild aortoiliac atherosclerosis. No acute vascular findings. Reproductive: The uterus and ovaries appear unremarkable. No adnexal mass. Other: No evidence of abdominal wall mass or hernia. No ascites. Musculoskeletal: No acute or significant osseous findings. Lower lumbar spondylosis with disc space narrowing, endplate osteophytes and facet hypertrophy. Review of the MIP images confirms the above findings. IMPRESSION: 1. No evidence of acute pulmonary embolism or other acute chest findings. 2. No evidence of malignancy within the chest, abdomen or  pelvis. 3. Small intermediate density pericardial effusion versus thickening. Consider further evaluation with echocardiography. 4. Aberrant right subclavian artery. 5. Mild lower lumbar spondylosis. 6.  Aortic Atherosclerosis (ICD10-I70.0). Electronically Signed   By: Carey Bullocks M.D.   On: 10/12/2022 14:30   CT Angio Chest Pulmonary Embolism (PE) W or WO Contrast  Result Date: 10/12/2022 CLINICAL DATA:  Right-sided chest pain with positive D-dimer levels. Pulmonary embolism suspected, low to intermediate probability. Unintentional weight loss with bruising and vaginal spotting. Evaluate for malignancy. EXAM: CT ANGIOGRAPHY CHEST CT ABDOMEN AND PELVIS WITH CONTRAST TECHNIQUE: Multidetector CT imaging of the chest was performed using the standard protocol during bolus administration of intravenous contrast. Multiplanar CT image reconstructions and MIPs were obtained to evaluate the vascular anatomy. Multidetector CT imaging of the abdomen and pelvis was performed using the standard protocol during bolus administration of intravenous contrast. RADIATION DOSE REDUCTION: This exam was performed according to the departmental dose-optimization program which includes automated exposure control, adjustment of the mA and/or kV according to patient size and/or use of iterative reconstruction technique. CONTRAST:  75mL OMNIPAQUE IOHEXOL 350 MG/ML SOLN COMPARISON:  Chest radiographs 05/19/2018. Abdominal CT 07/13/2011, abdominal MRI 07/24/2022 and pelvic ultrasound 10/10/2022. FINDINGS: CTA CHEST FINDINGS Cardiovascular: The pulmonary arteries are well opacified with contrast to the level of the subsegmental branches. There is no evidence of acute pulmonary embolism. No acute systemic arterial abnormalities are identified. There is minimal aortic atherosclerosis. 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 There is a small amount of intermediate see pericardial fluid versus thickening. The heart size  is normal. Mediastinum/Nodes: There are no enlarged mediastinal, hilar or axillary lymph nodes. The thyroid gland, trachea and esophagus demonstrate no significant findings. Lungs/Pleura: No pleural effusion or pneumothorax. The lungs are clear. Musculoskeletal/Chest wall: No chest wall mass or suspicious osseous findings. Mild thoracic spondylosis. CT ABDOMEN AND PELVIS FINDINGS Hepatobiliary: The liver is normal in density without suspicious focal abnormality. Multiple hepatic cysts are again demonstrated. Some of these have enlarged from the previous examinations, although the largest remaining cyst in the left hepatic lobe has decreased in size from the remote CT, currently measuring 2.4 cm on image 9/7 (previously 4.8 cm). Current size is similar to recent MRI. No evidence of gallstones, gallbladder wall thickening or biliary dilatation. Pancreas: Unremarkable. No pancreatic ductal dilatation or surrounding inflammatory changes. Spleen: Normal in size without focal abnormality. Adrenals/Urinary Tract: Both adrenal glands appear normal. No evidence of urinary tract calculus, suspicious renal lesion or hydronephrosis. The bladder appears normal for its degree of distention. Stomach/Bowel: No enteric contrast administered. The stomach appears unremarkable for its degree of distension. No evidence of bowel wall thickening, distention or surrounding inflammatory change. The appendix appears normal. Mildly prominent stool in the proximal colon. Vascular/Lymphatic: There are no enlarged abdominal or pelvic lymph nodes. Mild aortoiliac atherosclerosis. No acute vascular findings. Reproductive: The uterus and ovaries appear unremarkable. No adnexal mass. Other: No evidence of abdominal wall mass or hernia. No ascites. Musculoskeletal: No acute or significant osseous findings. Lower lumbar spondylosis  with disc space narrowing, endplate osteophytes and facet hypertrophy. Review of the MIP images confirms the above  findings. IMPRESSION: 1. No evidence of acute pulmonary embolism or other acute chest findings. 2. No evidence of malignancy within the chest, abdomen or pelvis. 3. Small intermediate density pericardial effusion versus thickening. Consider further evaluation with echocardiography. 4. Aberrant right subclavian artery. 5. Mild lower lumbar spondylosis. 6.  Aortic Atherosclerosis (ICD10-I70.0). Electronically Signed   By: Carey Bullocks M.D.   On: 10/12/2022 14:30   US PELVIC COMPLETE WITH TRANSVAGINAL  Result Date: 10/10/2022 CLINICAL DATA:  Weight loss, postmenopausal vaginal bleeding EXAM: TRANSABDOMINAL AND TRANSVAGINAL ULTRASOUND OF PELVIS TECHNIQUE: Both transabdominal and transvaginal ultrasound examinations of the pelvis were performed. Transabdominal technique was performed for global imaging of the pelvis including uterus, ovaries, adnexal regions, and pelvic cul-de-sac. It was necessary to proceed with endovaginal exam following the transabdominal exam to visualize the endometrium and ovaries bilaterally. COMPARISON:  08/10/2011 FINDINGS: Uterus Measurements: 4.7 x 1.9 x 4.0 cm = volume: 18 mL. No fibroids or other mass visualized. Endometrium Thickness: 2 mm.  No focal abnormality visualized. Right ovary Measurements: 1.8 x 1.7 x 1.3 cm = volume: 2 mL. Normal appearance/no adnexal mass. Left ovary Measurements: 1.5 x 0.9 x 1.3 cm = volume: 1 mL. Normal appearance/no adnexal mass. Other findings No abnormal free fluid. IMPRESSION: 1. Normal pelvic sonogram. Electronically Signed   By: Helyn Numbers M.D.   On: 10/10/2022 17:31     --------------------------------    Signed: Lula Olszewski, MD 10/25/2022 8:47 PM

## 2022-10-25 NOTE — Assessment & Plan Note (Addendum)
Negative CT angiogram but worsening since- blood clot seems unlikely given the negative CT angiogram and duration, and cardiac pain unlikely due to diamond classification and nonsmoker Mostly I suspect its due to thoracic degenerative disk disease that I saw on CT angiogram. Tried treating the pain by increasing gabapentin for this reason. Advised patient to call and ask for sooner appointment with cardiology, go to emergency room if severe Planned to go ahead and get CT coronary calcium score in meantime due to peripheral arterial disease found and high cholesterol

## 2022-10-25 NOTE — Assessment & Plan Note (Signed)
I plan to put this off as a finding until we have sorted out the much more concerning issue of her recent weight loss but we will bring her back in a few more weeks and maybe will get to it then if we have sorted out the other problem

## 2022-10-25 NOTE — Assessment & Plan Note (Addendum)
Reviewed emergency room evaluation: Orthostatics positive, associated with lying down and breathing. CT angiogram negative. EKG negative from ER  Will get neurology to look at, cardiology referral already pending. Return to emergency room if progresses.

## 2022-10-25 NOTE — Assessment & Plan Note (Addendum)
Reviewed chest CT images together with patient.  I assured her that the aortic atherosclerosis is pretty scattered all over and that I do think she needs to take the higher dose statin for this and I also showed her that her back is much worse than we knew and that they did not really talk about that much because that was not the reason for the CAT scan but I went ahead and listed that is a problem Advised patient I have no evidence for malignancy at this point but want to continue close follow up- although this could just be all due to some sort of chronic gastritis, need gastrointestinal evaluation next. Stabilized - up 3 pounds taking Remeron, continue with Remeron and trying to maintain wait Will see gastrointestinal about this and hepatic cysts later today Less concerned about nodules since specialist also felt benign- but we still might need biopsy later Denies fevers, cough, no evidence infection(s) anywhere Echocardiogram being planned to rule out infective endocarditis and evaluate CT abnormal. No evidence for sarcoidosis on CT or labs so artificial intelligence analysis preference for this diagnosis unlikely to be correct in my medical opinion  Will hold off on pet as long as she can maintain weight and until gastrointestinal analysis complete, reconsider in follow up 1 month  No really any joint pain except osteoarthritis in spine- so I don't see need to refer to rheumatoid at this time. Ok with plan not yet biopsy suspicious nodules from specialist shared decision making- I agree they aren't that suspicious.  Reviewed all the data here with patient:   Latest Reference Range & Units 07/19/22 09:45 07/24/22 19:37 09/25/22 09:53 09/25/22 09:59 10/10/22 16:53 10/12/22 13:44  COMPREHENSIVE METABOLIC PANEL    Rpt !     Sodium 135 - 145 mEq/L   138     Potassium 3.5 - 5.1 mEq/L   3.6     Chloride 96 - 112 mEq/L   98     CO2 19 - 32 mEq/L   30     Glucose 70 - 99 mg/dL   96     BUN 6 - 23  mg/dL   17     Creatinine 1.61 - 1.20 mg/dL   0.96     Calcium 8.4 - 10.5 mg/dL   04.5     Alkaline Phosphatase 39 - 117 U/L   47     Albumin 3.5 - 5.2 g/dL   4.9     AST 0 - 37 U/L   40 (H)     ALT 0 - 35 U/L   99 (H)     Total Protein 6.0 - 8.3 g/dL   7.2     Total Bilirubin 0.2 - 1.2 mg/dL   0.7     GFR >40.98 mL/min   97.85     Total CHOL/HDL Ratio    3     Cholesterol 0 - 200 mg/dL   119 (H)     HDL Cholesterol >39.00 mg/dL   14.78     LDL (calc) 0 - 99 mg/dL   295 (H)     NonHDL    144.80     Triglycerides 0.0 - 149.0 mg/dL   621.3     VLDL 0.0 - 40.0 mg/dL   08.6     Folate >5.7 ng/mL   11.2     VITD 30.00 - 100.00 ng/mL   26.68 (L)     Vitamin B12 211 - 911 pg/mL  306     WBC 4.0 - 10.5 K/uL   7.2     RBC 3.87 - 5.11 Mil/uL   3.96     Hemoglobin 12.0 - 15.0 g/dL   13.2     HCT 44.0 - 10.2 %   37.1     MCV 78.0 - 100.0 fl   93.8     MCHC 30.0 - 36.0 g/dL   72.5     RDW 36.6 - 44.0 %   13.0     Platelets 150.0 - 400.0 K/uL   423.0 (H)     TSH 0.40 - 4.50 mIU/L    0.63    Thyroxine (T4) 5.1 - 11.9 mcg/dL    7.7    Free Thyroxine Index 1.4 - 3.8     2.5    T3 Uptake 22 - 35 %    33    Anti Nuclear Antibody (ANA) NEGATIVE    NEGATIVE     ANCA SCREEN Negative    Negative     Cyclic Citrullin Peptide Ab UNITS   <16     RA Latex Turbid. <14 IU/mL   <14     Hepatitis C Ab NON-REACTIVE    NON-REACTIVE     HIV NON-REACTIVE    NON-REACTIVE     CT ABDOMEN PELVIS W CONTRAST       Rpt  CT ANGIO CHEST PE W/CM &/OR WO CM       Rpt  MR ABDOMEN W WO CONTRAST   Rpt      US ABDOMEN LIMITED RUQ  Rpt       US PELVIC COMPLETE WITH TRANSVAGINAL      Rpt   !: Data is abnormal (H): Data is abnormally high (L): Data is abnormally low Rpt: View report in Results Review for more information

## 2022-10-26 ENCOUNTER — Ambulatory Visit (INDEPENDENT_AMBULATORY_CARE_PROVIDER_SITE_OTHER): Payer: Medicaid Other | Admitting: Physician Assistant

## 2022-10-26 ENCOUNTER — Encounter: Payer: Self-pay | Admitting: Neurology

## 2022-10-26 VITALS — BP 141/68 | HR 74 | Temp 98.2°F | Ht 63.0 in | Wt 126.0 lb

## 2022-10-26 DIAGNOSIS — F331 Major depressive disorder, recurrent, moderate: Secondary | ICD-10-CM

## 2022-10-26 DIAGNOSIS — Z79899 Other long term (current) drug therapy: Secondary | ICD-10-CM | POA: Insufficient documentation

## 2022-10-26 DIAGNOSIS — F411 Generalized anxiety disorder: Secondary | ICD-10-CM | POA: Diagnosis not present

## 2022-10-26 MED ORDER — LAMOTRIGINE 25 MG PO TABS
25.0000 mg | ORAL_TABLET | Freq: Every day | ORAL | 1 refills | Status: DC
Start: 2022-10-26 — End: 2022-11-16
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
## 2022-10-29 ENCOUNTER — Encounter (HOSPITAL_COMMUNITY): Payer: Self-pay | Admitting: Physician Assistant

## 2022-10-30 DIAGNOSIS — R634 Abnormal weight loss: Secondary | ICD-10-CM | POA: Diagnosis not present

## 2022-10-30 DIAGNOSIS — Z1211 Encounter for screening for malignant neoplasm of colon: Secondary | ICD-10-CM | POA: Diagnosis not present

## 2022-11-05 DIAGNOSIS — H5213 Myopia, bilateral: Secondary | ICD-10-CM | POA: Diagnosis not present

## 2022-11-05 DIAGNOSIS — A048 Other specified bacterial intestinal infections: Secondary | ICD-10-CM | POA: Diagnosis not present

## 2022-11-06 ENCOUNTER — Telehealth (HOSPITAL_COMMUNITY): Payer: Self-pay | Admitting: Physician Assistant

## 2022-11-06 ENCOUNTER — Telehealth (HOSPITAL_COMMUNITY): Payer: Self-pay | Admitting: *Deleted

## 2022-11-06 NOTE — Telephone Encounter (Signed)
PATIENT CALLED STATED THAT SHE WAS TOLD TO  CALL AFTER Starting  lamoTRIgine (LAMICTAL) 25 MG tablet if she develop  any rash or an problems. Patient stated that she has a rash on her body & isn't sure what to do ??  ** Called back  & informed to STOP the Lamictal until she has appt with provider.  Appt scheduled for 12/05/22

## 2022-11-07 NOTE — Telephone Encounter (Signed)
Message acknowledged and reviewed.

## 2022-11-08 ENCOUNTER — Encounter: Payer: Self-pay | Admitting: Family Medicine

## 2022-11-08 ENCOUNTER — Encounter: Payer: Commercial Managed Care - HMO | Admitting: Certified Nurse Midwife

## 2022-11-14 NOTE — Progress Notes (Signed)
GYNECOLOGY ANNUAL PREVENTATIVE CARE ENCOUNTER NOTE  History:     Amanda White is a 57 y.o. G43P2012 female here for a routine annual gynecologic exam.  Current complaints: urinary frequency although she denies dysuria or associated back pain.   Denies abnormal vaginal bleeding, discharge, pelvic pain, problems with intercourse or other gynecologic concerns. States she is not sexually active and declined STD testing.     Gynecologic History Patient's last menstrual period was 12/20/2011. Contraception: abstinence and postmenopausal Last Pap: 2017. Result was normal with negative HPV Last Mammogram: unknown.   Last Colonoscopy: 2022.  Result was abnormal, serrated sessile poly on pathology. PCP placed referral to GI 09/25/2022  Obstetric History OB History  Gravida Para Term Preterm AB Living  3 2 2  0 1 2  SAB IAB Ectopic Multiple Live Births  1 0 0 0      # Outcome Date GA Lbr Len/2nd Weight Sex Delivery Anes PTL Lv  3 Term           2 Term           1 SAB             Past Medical History:  Diagnosis Date   Abnormal cardiac CT angiography 10/12/2022   Small intermediate density pericardial effusion versus thickening. Consider further evaluation with echocardiography.   Anxiety    Bipolar 1 disorder (HCC)    Carpal tunnel syndrome 09/25/2022   R sided release 2022, all better now.   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   Depression    Depression    Dizziness 10/09/2022   Saw emergency room 10/03/22   GERD (gastroesophageal reflux disease)    Hypertension    Migraine    Subcutaneous nodule 09/25/2022    Past Surgical History:  Procedure Laterality Date   CARPAL TUNNEL RELEASE Right 04/18/2021   Procedure: RIGHT ENDOSCOPIC CARPAL  TUNNEL RELEASE;  Surgeon: Mack Hook, MD;  Location: [facility];  Service: Orthopedics;  Laterality: Right;  LENTH OF SURGERY: 30 MINUTES   DILATION AND CURETTAGE OF UTERUS      Current Outpatient Medications on File Prior to Visit  Medication Sig Dispense Refill   atorvastatin (LIPITOR) 40 MG tablet Take 1 tablet (40 mg total) by mouth daily. 90 tablet 3   clonazePAM (KLONOPIN) 2 MG tablet Take 2 mg by mouth at bedtime.      cyclobenzaprine (FLEXERIL) 10 MG tablet Take 1 tablet (10 mg total) by mouth daily as needed for muscle spasms. 30 tablet 2   gabapentin (NEURONTIN) 300 MG capsule Take 1 capsule (300 mg total) by mouth 3 (three) times daily. 270 capsule 3   ibuprofen (ADVIL) 200 MG tablet Take 3 tablets (600 mg total) by mouth every 6 (six) hours.     lisinopril-hydrochlorothiazide (PRINZIDE,ZESTORETIC) 10-12.5 MG tablet Take 1 tablet by mouth daily.  1   mirtazapine (REMERON) 7.5 MG tablet TAKE 1 TABLET BY MOUTH AT BEDTIME. 90 tablet 1   omeprazole (PRILOSEC) 20 MG capsule Take 20 mg by mouth 2 (two) times daily before a meal.     ondansetron (ZOFRAN ODT) 4 MG disintegrating tablet Take 1 tablet (4 mg total) by mouth every 8 (eight) hours as needed for nausea  or vomiting. 20 tablet 0   famotidine-calcium carbonate-magnesium hydroxide (PEPCID COMPLETE) 10-800-165 MG chewable tablet Chew 1 tablet by mouth daily as needed. (Patient not taking: Reported on 11/16/2022) 100 tablet 11   No current facility-administered medications on file prior to visit.    No Known Allergies  Social History:  reports that she has never smoked. She has never used smokeless tobacco. She reports that she does not drink alcohol and does not use drugs.  Family History  Problem Relation Age of Onset   Hypertension Mother    Stroke Father     The following portions of the patient's history were reviewed and updated as appropriate: allergies, current medications, past family history, past  medical history, past social history, past surgical history and problem list.  Review of Systems Pertinent items noted in HPI and remainder of comprehensive ROS otherwise negative.  Physical Exam:  BP 124/69   Pulse 79   Ht 5\' 3"  (1.6 m)   Wt 125 lb 11.2 oz (57 kg)   LMP 12/20/2011   BMI 22.27 kg/m  CONSTITUTIONAL: Well-developed, well-nourished female in no acute distress.  HENT:  Normocephalic, atraumatic, External right and left ear normal.  EYES: Conjunctivae and EOM are normal. Pupils are equal, round, and reactive to light. No scleral icterus.  NECK: Normal range of motion, supple, no masses.  Normal thyroid.  SKIN: Skin is warm and dry. No rash noted. Not diaphoretic. No erythema. No pallor. MUSCULOSKELETAL: Normal range of motion. No tenderness.  No cyanosis, clubbing, or edema. NEUROLOGIC: Alert and oriented to person, place, and time. Normal reflexes, muscle tone coordination.  PSYCHIATRIC: Normal mood and affect. Normal behavior. Normal judgment and thought content. CARDIOVASCULAR: Normal heart rate noted, regular rhythm RESPIRATORY: Clear to auscultation bilaterally. Effort and breath sounds normal, no problems with respiration noted. ABDOMEN: Soft, no distention noted.  No tenderness, rebound or guarding.  PELVIC: Normal appearing external genitalia and urethral meatus; normal appearing vaginal mucosa and cervix.  No abnormal vaginal discharge noted.  Pap smear obtained.  Normal uterine size, no other palpable masses, no uterine or adnexal tenderness.  Performed in the presence of a chaperone.   Assessment and Plan:    1. Encounter for gynecological examination with abnormal finding Followed closely by Dr. Jon Billings PCP. Hx of unintentional weight loss. Reviewed her ongoing work up. Most recently seen by PCP on 5/1. He plans to follow up in 1 month and awaiting GI results. - Cytology - PAP( [place])  2. Visit for screening mammogram - MM 3D SCREENING MAMMOGRAM  BILATERAL BREAST; Future  3. Urinary frequency -UA collected  4. Generalized anxiety disorder With major depressive disorder. Elevated PHQ 9 today. No SI. Sees BH most recent visit 5/2   Will follow up results of pap smear and manage accordingly. Mammogram scheduled Colon cancer screening is pending. Referred 4/1 by PCP.  Routine preventative health maintenance measures emphasized. Please refer to After Visit Summary for other counseling recommendations.      Lavonda Jumbo, DO OB Fellow, Faculty [facility], [facility] for [facility] 11/16/2022, 4:53 PM

## 2022-11-15 DIAGNOSIS — R7401 Elevation of levels of liver transaminase levels: Secondary | ICD-10-CM | POA: Diagnosis not present

## 2022-11-15 DIAGNOSIS — K259 Gastric ulcer, unspecified as acute or chronic, without hemorrhage or perforation: Secondary | ICD-10-CM | POA: Diagnosis not present

## 2022-11-15 DIAGNOSIS — R634 Abnormal weight loss: Secondary | ICD-10-CM | POA: Diagnosis not present

## 2022-11-16 ENCOUNTER — Ambulatory Visit (INDEPENDENT_AMBULATORY_CARE_PROVIDER_SITE_OTHER): Payer: Medicaid Other | Admitting: Family Medicine

## 2022-11-16 ENCOUNTER — Other Ambulatory Visit (HOSPITAL_COMMUNITY)
Admission: RE | Admit: 2022-11-16 | Discharge: 2022-11-16 | Disposition: A | Discharge status: Home / Self Care | Payer: Medicaid Other | Source: Ambulatory Visit | Attending: Certified Nurse Midwife | Admitting: Certified Nurse Midwife

## 2022-11-16 ENCOUNTER — Encounter: Payer: Self-pay | Admitting: Family Medicine

## 2022-11-16 ENCOUNTER — Other Ambulatory Visit: Payer: Self-pay

## 2022-11-16 VITALS — BP 124/69 | HR 79 | Ht 63.0 in | Wt 125.7 lb

## 2022-11-16 DIAGNOSIS — Z1231 Encounter for screening mammogram for malignant neoplasm of breast: Secondary | ICD-10-CM | POA: Diagnosis not present

## 2022-11-16 DIAGNOSIS — Z01419 Encounter for gynecological examination (general) (routine) without abnormal findings: Secondary | ICD-10-CM

## 2022-11-16 DIAGNOSIS — R350 Frequency of micturition: Secondary | ICD-10-CM | POA: Diagnosis not present

## 2022-11-16 DIAGNOSIS — Z01411 Encounter for gynecological examination (general) (routine) with abnormal findings: Secondary | ICD-10-CM

## 2022-11-16 DIAGNOSIS — F411 Generalized anxiety disorder: Secondary | ICD-10-CM

## 2022-11-16 NOTE — Progress Notes (Signed)
Pt presents with elevated PHQ9.  Pt denies [facility]

## 2022-11-17 ENCOUNTER — Other Ambulatory Visit: Payer: Self-pay

## 2022-11-17 DIAGNOSIS — R634 Abnormal weight loss: Secondary | ICD-10-CM

## 2022-11-17 LAB — POCT URINALYSIS DIP (DEVICE)
Bilirubin Urine: NEGATIVE
Glucose, UA: NEGATIVE mg/dL
Hgb urine dipstick: NEGATIVE
Ketones, ur: NEGATIVE mg/dL
Leukocytes,Ua: NEGATIVE
Nitrite: NEGATIVE
Protein, ur: NEGATIVE mg/dL
Specific Gravity, Urine: 1.01 (ref 1.005–1.030)
Urobilinogen, UA: 0.2 mg/dL (ref 0.0–1.0)
pH: 7 (ref 5.0–8.0)

## 2022-11-17 NOTE — Progress Notes (Deleted)
Initial neurology clinic note  Amanda White MRN: [id number] DOB: 29-Jan-1966  Referring provider: Lula Olszewski, MD  Primary care provider: Lula Olszewski, MD  Reason for consult:  dizziness  Subjective:  This is Ms. Amanda White, a 57 y.o. ***-handed female with a medical history of HTN, HLD, GERD, sciatica, bipolar 1, anxiety, carpal tunnel syndrome*** who presents to neurology clinic with dizziness. The patient is accompanied by ***.  ***  Dizziness Weight loss? Hx of carpal tunnel? Amyloid?***  Dr. Jon Billings, PCP has been evaluating for malignancy but not found anything to date. Per his clinic note from 10/25/22: Reviewed chest CT images together with patient.  I assured her that the aortic atherosclerosis is pretty scattered all over and that I do think she needs to take the higher dose statin for this and I also showed her that her back is much worse than we knew and that they did not really talk about that much because that was not the reason for the CAT scan but I went ahead and listed that is a problem Advised patient I have no evidence for malignancy at this point but want to continue close follow up- although this could just be all due to some sort of chronic gastritis, need gastrointestinal evaluation next. Stabilized - up 3 pounds taking Remeron, continue with Remeron and trying to maintain wait Will see gastrointestinal about this and hepatic cysts later today Less concerned about nodules since specialist also felt benign- but we still might need biopsy later Denies fevers, cough, no evidence infection(s) anywhere Echocardiogram being planned to rule out infective endocarditis and evaluate CT abnormal. No evidence for sarcoidosis on CT or labs so artificial intelligence analysis preference for this diagnosis unlikely to be correct in my medical opinion  Will hold off on pet as long as she can maintain weight and until gastrointestinal analysis complete,  reconsider in follow up 1 month  No really any joint pain except osteoarthritis in spine- so I don't see need to refer to rheumatoid at this time. Ok with plan not yet biopsy suspicious nodules from specialist shared decision making- I agree they aren't that suspicious.  *** On gabapentin*** dizziness before or after this medication?***  MEDICATIONS:  Outpatient Encounter Medications as of 12/01/2022  Medication Sig Note   atorvastatin (LIPITOR) 40 MG tablet Take 1 tablet (40 mg total) by mouth daily.    clonazePAM (KLONOPIN) 2 MG tablet Take 2 mg by mouth at bedtime.  05/20/2018: Scheduled    cyclobenzaprine (FLEXERIL) 10 MG tablet Take 1 tablet (10 mg total) by mouth daily as needed for muscle spasms.    famotidine-calcium carbonate-magnesium hydroxide (PEPCID COMPLETE) 10-800-165 MG chewable tablet Chew 1 tablet by mouth daily as needed. (Patient not taking: Reported on 11/16/2022)    gabapentin (NEURONTIN) 300 MG capsule Take 1 capsule (300 mg total) by mouth 3 (three) times daily.    ibuprofen (ADVIL) 200 MG tablet Take 3 tablets (600 mg total) by mouth every 6 (six) hours.    lisinopril-hydrochlorothiazide (PRINZIDE,ZESTORETIC) 10-12.5 MG tablet Take 1 tablet by mouth daily.    mirtazapine (REMERON) 7.5 MG tablet TAKE 1 TABLET BY MOUTH AT BEDTIME.    omeprazole (PRILOSEC) 20 MG capsule Take 20 mg by mouth 2 (two) times daily before a meal.    ondansetron (ZOFRAN ODT) 4 MG disintegrating tablet Take 1 tablet (4 mg total) by mouth every 8 (eight) hours as needed for nausea or vomiting.    No facility-administered encounter  medications on file as of 12/01/2022.    PAST MEDICAL HISTORY: Past Medical History:  Diagnosis Date   Abnormal cardiac CT angiography 10/12/2022   Small intermediate density pericardial effusion versus thickening. Consider further evaluation with echocardiography.   Anxiety    Bipolar 1 disorder (HCC)    Carpal tunnel syndrome 09/25/2022   R sided release 2022, all  better now.   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   Depression    Depression    Dizziness 10/09/2022   Saw emergency room 10/03/22   GERD (gastroesophageal reflux disease)    Hypertension    Migraine    Subcutaneous nodule 09/25/2022    PAST SURGICAL HISTORY: Past Surgical History:  Procedure Laterality Date   CARPAL TUNNEL RELEASE Right 04/18/2021   Procedure: RIGHT ENDOSCOPIC CARPAL TUNNEL RELEASE;  Surgeon: Mack Hook, MD;  Location: [facility];  Service: Orthopedics;  Laterality: Right;  LENTH OF SURGERY: 30 MINUTES   DILATION AND CURETTAGE OF UTERUS      ALLERGIES: No Known Allergies  FAMILY HISTORY: Family History  Problem Relation White of Onset   Hypertension Mother    Stroke Father     SOCIAL HISTORY: Social History   Tobacco Use   Smoking status: Never   Smokeless tobacco: Never  Substance Use Topics   Alcohol use: No    Comment: occas.   Drug use: No   Social History   Social History Narrative   Not on file    Objective:  Vital Signs:  LMP 12/20/2011   ***  Labs and Imaging review: Internal labs: No results found for: "HGBA1C" Lab Results  Component Value Date   VITAMINB12 306 09/25/2022   Lab Results  Component Value Date   TSH 0.63 09/25/2022   09/25/22: ANCA negative ANA negative RF negative CCP negative Folate wnl Vit D low at 26.68 HIV non-reactive Hep C non-reactive ***  External labs: CBC (10/03/22): unremarkable CMP (10/03/22): elevated ALT (98), AST (46), otherwise unremarkable  Imaging: CTA Chest and CT abd/pelvis (10/12/22): FINDINGS: CTA CHEST FINDINGS   Cardiovascular: The pulmonary arteries are well opacified with contrast to the level of the subsegmental  branches. There is no evidence of acute pulmonary embolism. No acute systemic arterial abnormalities are identified. There is minimal aortic atherosclerosis. 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 There is a small amount of intermediate see pericardial fluid versus thickening. The heart size is normal.   Mediastinum/Nodes: There are no enlarged mediastinal, hilar or axillary lymph nodes. The thyroid gland, trachea and esophagus demonstrate no significant findings.   Lungs/Pleura: No pleural effusion or pneumothorax. The lungs are clear.   Musculoskeletal/Chest wall: No chest wall mass or suspicious osseous findings. Mild thoracic spondylosis.   CT ABDOMEN AND PELVIS FINDINGS   Hepatobiliary: The liver is normal in density without suspicious focal abnormality. Multiple hepatic cysts are again demonstrated. Some of these have enlarged from the previous examinations, although the largest remaining cyst in the left hepatic lobe has decreased in size from the remote CT, currently measuring 2.4 cm on image 9/7 (previously 4.8 cm). Current size is similar to recent MRI. No evidence of gallstones, gallbladder wall thickening or biliary dilatation.   Pancreas: Unremarkable. No pancreatic ductal dilatation or surrounding  inflammatory changes.   Spleen: Normal in size without focal abnormality.   Adrenals/Urinary Tract: Both adrenal glands appear normal. No evidence of urinary tract calculus, suspicious renal lesion or hydronephrosis. The bladder appears normal for its degree of distention.   Stomach/Bowel: No enteric contrast administered. The stomach appears unremarkable for its degree of distension. No evidence of bowel wall thickening, distention or surrounding inflammatory change. The appendix appears normal. Mildly prominent stool in the proximal colon.   Vascular/Lymphatic: There are no enlarged abdominal or pelvic lymph nodes. Mild aortoiliac  atherosclerosis. No acute vascular findings.   Reproductive: The uterus and ovaries appear unremarkable. No adnexal mass.   Other: No evidence of abdominal wall mass or hernia. No ascites.   Musculoskeletal: No acute or significant osseous findings. Lower lumbar spondylosis with disc space narrowing, endplate osteophytes and facet hypertrophy.   Review of the MIP images confirms the above findings.   IMPRESSION: 1. No evidence of acute pulmonary embolism or other acute chest findings. 2. No evidence of malignancy within the chest, abdomen or pelvis. 3. Small intermediate density pericardial effusion versus thickening. Consider further evaluation with echocardiography. 4. Aberrant right subclavian artery. 5. Mild lower lumbar spondylosis. 6.  Aortic Atherosclerosis (ICD10-I70.0).  CT head wo contrast (04/21/2009): Findings: The ventricular system is normal in size and  configuration, and the septum is in a normal midline position.  The  fourth ventricle and basilar cisterns appear normal.  No  hemorrhage, mass lesion, or acute infarction is seen.  On bone  window images no bony abnormality is noted.  The paranasal sinuses  that are visualized are clear.    IMPRESSION:  No acute intracranial abnormality.  ***  Assessment/Plan:  Amanda White is a 57 y.o. female who presents for evaluation of ***. *** has a relevant medical history of ***. *** neurological examination is pertinent for ***. Available diagnostic data is significant for ***. This constellation of symptoms and objective data would most likely localize to ***. ***  PLAN: -Blood work: *** *** -B12 supplementation due to borderline B12, 1000 mcg daily -Vitamin D 1000 internation units (IU) daily  -Return to clinic ***  The impression above as well as the plan as outlined below were extensively discussed with the patient (in the company of ***) who voiced understanding. All questions were answered to their  satisfaction.  The patient was counseled on pertinent fall precautions per the printed material provided today, and as noted under the "Patient Instructions" section below.***  When available, results of the above investigations and possible further recommendations will be communicated to the patient via telephone/MyChart. Patient to call office if not contacted after expected testing turnaround time.   Total time spent reviewing records, interview, history/exam, documentation, and coordination of care on day of encounter:  *** min   Thank you for allowing me to participate in patient's care.  If I can answer any additional questions, I would be pleased to do so.  Jacquelyne Balint, MD   CC: Lula Olszewski, MD [address] [place] [address]  CC: Referring provider: Lula Olszewski, MD [address] [place],  [place] [address]

## 2022-11-21 ENCOUNTER — Other Ambulatory Visit: Payer: Self-pay | Admitting: "Endocrinology

## 2022-11-21 ENCOUNTER — Other Ambulatory Visit (INDEPENDENT_AMBULATORY_CARE_PROVIDER_SITE_OTHER): Payer: Medicaid Other

## 2022-11-21 DIAGNOSIS — R634 Abnormal weight loss: Secondary | ICD-10-CM

## 2022-11-21 LAB — BASIC METABOLIC PANEL
BUN: 15 mg/dL (ref 6–23)
CO2: 30 mEq/L (ref 19–32)
Calcium: 9.9 mg/dL (ref 8.4–10.5)
Chloride: 97 mEq/L (ref 96–112)
Creatinine, Ser: 0.72 mg/dL (ref 0.40–1.20)
GFR: 92.47 mL/min (ref >60.00)
Glucose, Bld: 93 mg/dL (ref 70–99)
Potassium: 4.1 mEq/L (ref 3.5–5.1)
Sodium: 136 mEq/L (ref 135–145)

## 2022-11-21 LAB — T4, FREE: Free T4: 0.76 ng/dL (ref 0.60–1.60)

## 2022-11-21 LAB — TSH: TSH: 1.31 u[IU]/mL (ref 0.35–5.50)

## 2022-11-21 NOTE — Progress Notes (Signed)
c 

## 2022-11-23 LAB — CYTOLOGY - PAP
Comment: NEGATIVE
Diagnosis: NEGATIVE
High risk HPV: NEGATIVE

## 2022-11-24 ENCOUNTER — Encounter: Payer: Self-pay | Admitting: "Endocrinology

## 2022-11-24 ENCOUNTER — Ambulatory Visit (INDEPENDENT_AMBULATORY_CARE_PROVIDER_SITE_OTHER): Payer: Medicaid Other | Admitting: "Endocrinology

## 2022-11-24 VITALS — BP 120/70 | HR 86 | Ht 63.0 in | Wt 125.4 lb

## 2022-11-24 DIAGNOSIS — R634 Abnormal weight loss: Secondary | ICD-10-CM | POA: Diagnosis not present

## 2022-11-24 NOTE — Progress Notes (Signed)
Outpatient Endocrinology Note Amanda Morton, MD    Amanda White August 10, 1965 [id number]  Referring Provider: Lula Olszewski, MD Primary Care Provider: Lula Olszewski, MD Reason for consultation: Subjective   Assessment & Plan  Diagnoses and all orders for this visit:  Unintentional weight loss     Return in about 4 weeks (around 12/22/2022) for visit + labs now at 8 am.   I have reviewed current medications, nurse's notes, allergies, vital signs, past medical and surgical history, family medical history, and social history for this encounter. Counseled patient on symptoms, examination findings, lab findings, imaging results, treatment decisions and monitoring and prognosis. The patient understood the recommendations and agrees with the treatment plan. All questions regarding treatment plan were fully answered.  Amanda , MD  11/24/22   History of Present Illness HPI  Amanda White is a 57 y.o. year old female who presents for evaluation of weight loss.  Current regimen: Remeron 7.5mg  daily since 1 mo helped gaining 7 lbs  Pt reports losing 56 lbs in few months unintentionally  Highest weight 178 lbs, lowest weight 118 lbs  Sometimes get night sweats but not drenching No abdominal pain  Found out peptic ulcer on endoscopy,  on PPI BID   Physical Exam  BP 120/70   Pulse 86   Ht 5\' 3"  (1.6 m)   Wt 125 lb 6.4 oz (56.9 kg)   LMP 12/20/2011   SpO2 99%   BMI 22.21 kg/m    Constitutional: well developed, well nourished Head: normocephalic, atraumatic Eyes: sclera anicteric, no redness Neck: supple Lungs: normal respiratory effort Neurology: alert and oriented Skin: dry, no appreciable rashes Musculoskeletal: no appreciable defects Psychiatric: normal mood and affect   Current Medications Patient's Medications  New Prescriptions   No medications on file  Previous Medications   ACETAMINOPHEN (TYLENOL) 325 MG TABLET    Take 650 mg by  mouth every 6 (six) hours as needed.   ATORVASTATIN (LIPITOR) 40 MG TABLET    Take 1 tablet (40 mg total) by mouth daily.   CLONAZEPAM (KLONOPIN) 2 MG TABLET    Take 2 mg by mouth at bedtime.    CYCLOBENZAPRINE (FLEXERIL) 10 MG TABLET    Take 1 tablet (10 mg total) by mouth daily as needed for muscle spasms.   FAMOTIDINE-CALCIUM CARBONATE-MAGNESIUM HYDROXIDE (PEPCID COMPLETE) 10-800-165 MG CHEWABLE TABLET    Chew 1 tablet by mouth daily as needed.   GABAPENTIN (NEURONTIN) 300 MG CAPSULE    Take 1 capsule (300 mg total) by mouth 3 (three) times daily.   IBUPROFEN (ADVIL) 200 MG TABLET    Take 3 tablets (600 mg total) by mouth every 6 (six) hours.   LISINOPRIL-HYDROCHLOROTHIAZIDE (PRINZIDE,ZESTORETIC) 10-12.5 MG TABLET    Take 1 tablet by mouth daily.   MIRTAZAPINE (REMERON) 7.5 MG TABLET    TAKE 1 TABLET BY MOUTH AT BEDTIME.   OMEPRAZOLE (PRILOSEC) 20 MG CAPSULE    Take 20 mg by mouth 2 (two) times daily before a meal.   ONDANSETRON (ZOFRAN ODT) 4 MG DISINTEGRATING TABLET    Take 1 tablet (4 mg total) by mouth every 8 (eight) hours as needed for nausea or vomiting.  Modified Medications   No medications on file  Discontinued Medications   No medications on file    Allergies No Known Allergies  Past Medical History Past Medical History:  Diagnosis Date   Abnormal cardiac CT angiography 10/12/2022   Small intermediate density pericardial effusion versus thickening.  Consider further evaluation with echocardiography.   Anxiety    Bipolar 1 disorder (HCC)    Carpal tunnel syndrome 09/25/2022   R sided release 2022, all better now.   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   Depression    Depression    Dizziness 10/09/2022   Saw emergency room 10/03/22   GERD  (gastroesophageal reflux disease)    Hypertension    Migraine    Subcutaneous nodule 09/25/2022    Past Surgical History Past Surgical History:  Procedure Laterality Date   CARPAL TUNNEL RELEASE Right 04/18/2021   Procedure: RIGHT ENDOSCOPIC CARPAL TUNNEL RELEASE;  Surgeon: Mack Hook, MD;  Location: [facility];  Service: Orthopedics;  Laterality: Right;  LENTH OF SURGERY: 30 MINUTES   DILATION AND CURETTAGE OF UTERUS      Family History family history includes Hypertension in her mother; Stroke in her father.  Social History Social History   Socioeconomic History   Marital status: Married    Spouse name: Not on file   Number of children: Not on file   Years of education: Not on file   Highest education level: Not on file  Occupational History   Not on file  Tobacco Use   Smoking status: Never   Smokeless tobacco: Never  Substance and Sexual Activity   Alcohol use: No    Comment: occas.   Drug use: No   Sexual activity: Not on file  Other Topics Concern   Not on file  Social History Narrative   Not on file   Social Determinants of Health   Financial Resource Strain: Not on file  Food Insecurity: Not on file  Transportation Needs: Not on file  Physical Activity: Not on file  Stress: Not on file  Social Connections: Not on file  Intimate Partner Violence: Not on file    Lab Results  Component Value Date   CHOL 221 (H) 09/25/2022   Lab Results  Component Value Date   HDL 76.50 09/25/2022   Lab Results  Component Value Date   LDLCALC 124 (H) 09/25/2022   Lab Results  Component Value Date   TRIG 102.0 09/25/2022   Lab Results  Component Value Date   CHOLHDL 3 09/25/2022   Lab Results  Component Value Date   CREATININE 0.72 11/21/2022   Lab Results  Component Value Date   GFR 92.47 11/21/2022      Component Value Date/Time   NA 136 11/21/2022 1000   K 4.1 11/21/2022 1000   CL 97 11/21/2022 1000   CO2 30 11/21/2022 1000    GLUCOSE 93 11/21/2022 1000   BUN 15 11/21/2022 1000   CREATININE 0.72 11/21/2022 1000   CALCIUM 9.9 11/21/2022 1000   PROT 7.2 09/25/2022 0953   ALBUMIN 4.9 09/25/2022 0953   AST 40 (H) 09/25/2022 0953   ALT 99 (H) 09/25/2022 0953   ALKPHOS 47 09/25/2022 0953   BILITOT 0.7 09/25/2022 0953   GFRNONAA >60 04/12/2021 1309   GFRAA >60 05/19/2018 2151      Latest Ref Rng & Units 11/21/2022   10:00 AM 09/25/2022    9:53 AM 04/12/2021    1:09 PM  BMP  Glucose 70 - 99 mg/dL 93  96  99  BUN 6 - 23 mg/dL 15  17  17    Creatinine 0.40 - 1.20 mg/dL 1.61  0.96  0.45   Sodium 135 - 145 mEq/L 136  138  138   Potassium 3.5 - 5.1 mEq/L 4.1  3.6  4.3   Chloride 96 - 112 mEq/L 97  98  102   CO2 19 - 32 mEq/L 30  30  31    Calcium 8.4 - 10.5 mg/dL 9.9  40.9  9.5        Component Value Date/Time   WBC 7.2 09/25/2022 0953   RBC 3.96 09/25/2022 0953   HGB 13.0 09/25/2022 0953   HCT 37.1 09/25/2022 0953   PLT 423.0 (H) 09/25/2022 0953   MCV 93.8 09/25/2022 0953   MCH 32.2 06/22/2020 1326   MCHC 34.9 09/25/2022 0953   RDW 13.0 09/25/2022 0953   LYMPHSABS 1.3 06/22/2020 1326   MONOABS 1.0 06/22/2020 1326   EOSABS 0.1 06/22/2020 1326   BASOSABS 0.0 06/22/2020 1326   Lab Results  Component Value Date   TSH 1.31 11/21/2022   TSH 0.63 09/25/2022   FREET4 0.76 11/21/2022         Parts of this note may have been dictated using voice recognition software. There may be variances in spelling and vocabulary which are unintentional. Not all errors are proofread. Please notify the Thereasa Parkin if any discrepancies are noted or if the meaning of any statement is not clear.

## 2022-11-25 DIAGNOSIS — Z419 Encounter for procedure for purposes other than remedying health state, unspecified: Secondary | ICD-10-CM | POA: Diagnosis not present

## 2022-11-27 ENCOUNTER — Ambulatory Visit (HOSPITAL_BASED_OUTPATIENT_CLINIC_OR_DEPARTMENT_OTHER): Payer: Medicaid Other

## 2022-11-28 ENCOUNTER — Ambulatory Visit (INDEPENDENT_AMBULATORY_CARE_PROVIDER_SITE_OTHER): Payer: Medicaid Other | Admitting: Internal Medicine

## 2022-11-28 ENCOUNTER — Encounter: Payer: Self-pay | Admitting: Internal Medicine

## 2022-11-28 VITALS — BP 110/68 | HR 88 | Temp 98.0°F | Ht 63.0 in | Wt 125.4 lb

## 2022-11-28 DIAGNOSIS — R634 Abnormal weight loss: Secondary | ICD-10-CM

## 2022-11-28 DIAGNOSIS — M1990 Unspecified osteoarthritis, unspecified site: Secondary | ICD-10-CM

## 2022-11-28 DIAGNOSIS — K279 Peptic ulcer, site unspecified, unspecified as acute or chronic, without hemorrhage or perforation: Secondary | ICD-10-CM | POA: Insufficient documentation

## 2022-11-28 DIAGNOSIS — K259 Gastric ulcer, unspecified as acute or chronic, without hemorrhage or perforation: Secondary | ICD-10-CM | POA: Diagnosis not present

## 2022-11-28 DIAGNOSIS — I10 Essential (primary) hypertension: Secondary | ICD-10-CM

## 2022-11-28 DIAGNOSIS — R413 Other amnesia: Secondary | ICD-10-CM | POA: Diagnosis not present

## 2022-11-28 DIAGNOSIS — F1320 Sedative, hypnotic or anxiolytic dependence, uncomplicated: Secondary | ICD-10-CM

## 2022-11-28 DIAGNOSIS — E785 Hyperlipidemia, unspecified: Secondary | ICD-10-CM | POA: Diagnosis not present

## 2022-11-28 DIAGNOSIS — Q446 Cystic disease of liver: Secondary | ICD-10-CM | POA: Diagnosis not present

## 2022-11-28 DIAGNOSIS — K7689 Other specified diseases of liver: Secondary | ICD-10-CM

## 2022-11-28 DIAGNOSIS — R10811 Right upper quadrant abdominal tenderness: Secondary | ICD-10-CM

## 2022-11-28 DIAGNOSIS — F411 Generalized anxiety disorder: Secondary | ICD-10-CM | POA: Diagnosis not present

## 2022-11-28 DIAGNOSIS — R229 Localized swelling, mass and lump, unspecified: Secondary | ICD-10-CM

## 2022-11-28 DIAGNOSIS — L8990 Pressure ulcer of unspecified site, unspecified stage: Secondary | ICD-10-CM

## 2022-11-28 DIAGNOSIS — M545 Low back pain, unspecified: Secondary | ICD-10-CM

## 2022-11-28 HISTORY — DX: Pressure ulcer of unspecified site, unspecified stage: L89.90

## 2022-11-28 MED ORDER — CELECOXIB 100 MG PO CAPS
100.0000 mg | ORAL_CAPSULE | Freq: Two times a day (BID) | ORAL | 3 refills | Status: DC
Start: 2022-11-28 — End: 2022-12-25

## 2022-11-28 NOTE — Progress Notes (Signed)
[facility]: [phone]   Routine Medical Office Visit  Patient:  Amanda White      Age: 57 y.o.       Sex:  female  Date:   11/28/2022 PCP:    Lula Olszewski, MD   Today's Healthcare Provider: Lula Olszewski, MD   Assessment and Plan:   Based on [employer] AI conversational text extraction: Assessment and Plan    Gastric Ulcer: Deep crater ulcer diagnosed by GI specialist, likely due to stress and NSAID use. Currently on Omeprazole BID. -Continue Omeprazole BID. -Perform stool test for Helicobacter pylori to rule out bacterial cause of ulcer. -Repeat endoscopy scheduled for January 08, 2023.  Unexplained Weight Loss: Stable weight with no evidence of malignancy on recent imaging. Likely related to gastric ulcer. -Continue Remeron to maintain weight until ulcer heals and weight gain can be achieved.  Arthritis: Complaints of body aches and pains, particularly in left lateral knee and lower lumbar region. Previously managed with Advil, now stopped due to gastric ulcer. -Start Celebrex for arthritis pain management. -Order lumbar x-ray. -Refer for physical therapy for low back pain.  Psychiatric Management: Rash developed after starting Lamictal, medication stopped. -Follow up with psychiatrist as scheduled.  General Health Maintenance: -Schedule mammogram. -Continue follow-up with cardiology for plaque identified on previous imaging. -Cancel neurology appointment and brain MRI if dizziness has resolved. -Continue monitoring liver cysts, currently considered benign. -Repeat colonoscopy in 2027 as per previous recommendation.      Based on updated problems and orders placed with problem based charting:  Assessment and Plan Amanda White was seen today for anxiety and 1 month follow-up.  Unintentional weight loss Overview: November 28, 2022 interim history:   Denies any heartburn, prior abdomen/flank pain gone, just some right upper chest pain now,  gastrointestinal did endoscopy and found ulcer but we don't have report.  Off advil. Weight stable. Continuing remeron  Wt Readings from Last 5 Encounters:  11/28/22 125 lb 6.4 oz (56.9 kg)  11/24/22 125 lb 6.4 oz (56.9 kg)  11/16/22 125 lb 11.2 oz (57 kg)  10/25/22 125 lb 6.4 oz (56.9 kg)  10/09/22 122 lb (55.3 kg)  50 pounds 1 year unintentional weight loss at age 68 is worrisome  Interim history:  Endoscopy consultation planned for later today chronic subcutaneous nodule R neck and right flank seen by ENT who reassured offered biopsy shared decision making decided to hold off due to low risk Still has no pulmonary symptom(s) and CT chest was negative for tumor Patient reports stopping omeprazole and Nexium and has Pepcid complete and not having to use Right upper quadrant of abdomen pain with hemorrhagic liver cysts on MRI.  This resolved after adding Pepcid complete and she stopped Nexium.  Prior history:  On 09/25/22  weight loss was associated with poorly controlled gastroesophageal reflux disease despite Nexium Remeron trial given and she is taking now and having more dizziness since starting it but weight stabilizing 09/25/22 referred to gastrointestinal and surgery input pending Massive lab workup negative CT of chest/abdomen/pelvis negative 09/2022  Assessment & Plan: maybe due to ulcer, stable Advised patient to try to maintain current weight. Sarcoidosis and lymphoma seem less likely patient reports no change in size of nodules Will try to get gastrointestinal records    Stress ulcer of stomach Overview: Diagnosed 09/2022 by Dr. Tobe Sos per patient report  Deep crater ulcer per patient report  She also was taking advils and has stopped  Assessment & Plan: Be  sure to use as needed Pepcid Taking omeprazole 40 mg daily.  Orders: -     Celecoxib; Take 1 capsule (100 mg total) by mouth 2 (two) times daily.  Dispense: 180 capsule; Refill: 3 -     H. pylori antigen,  stool  Liver cyst  Cystic disease of liver Overview: Patients chart review and interview were used to generate a prompt for artificial intelligence analysis ([employer] artificial intelligence) clinical decision support.  AI output was reviewed and is provided in red:  The liver cysts in this patient, given the context of unintentional weight loss, chronic subcutaneous nodules, and lymphadenopathy, may be part of a broader systemic process rather than an isolated hepatic issue. While the MRI findings favor a benign hepatic cyst, the systemic symptoms suggest the need for further evaluation for systemic diseases, including autoimmune conditions or sarcoidosis. A multidisciplinary approach involving hepatology, rheumatology, and possibly oncology, depending on evolving findings, is recommended to comprehensively address the patient's symptoms and to ensure a holistic approach to her care. MR ABDOMEN WWO CONTRASTResult Date: 07/24/2022   IMPRESSION: Motion degraded images. 2.2 cm hemorrhagic cyst in segment 2, corresponding to the sonographic abnormality, benign. No follow-up imaging is recommended. Multiple additional simple benign hepatic cysts, as above. No suspicious hepatic lesions.   US Abdomen Limited RUQ (LIVER/GB)Result Date: 07/19/2022  IMPRESSION: 1. Indeterminate hypoechoic mass within the left hepatic lobe measuring up to 2.4 cm. Recommend further evaluation with pre and post contrast-enhanced abdominal MRI. 2. No cholelithiasis or sonographic evidence for acute cholecystitis.    Arthritis -     Celecoxib; Take 1 capsule (100 mg total) by mouth 2 (two) times daily.  Dispense: 180 capsule; Refill: 3 -     DG Lumbar Spine Complete; Future -     Ambulatory referral to Physical Therapy  Lumbar pain -     Celecoxib; Take 1 capsule (100 mg total) by mouth 2 (two) times daily.  Dispense: 180 capsule; Refill: 3 -     DG Lumbar Spine Complete; Future -     Ambulatory referral to Physical  Therapy  Benzodiazepine dependence (HCC) Overview: Can't sleep without, attempts to taper are destabilizing, has been on at least 10 years  Assessment & Plan: Patient concerned about discontinuation xanax.   Advised patient that I would fill only if psychiatry doesn't take over, and it would be tapering.  Ideally, the taper is managed by psychiatry who can adjust other psychiatric medications alongside it.   She has family history dementia and understands this risk but worried coming off xanax will be difficult/impossible.   Hyperlipidemia, unspecified hyperlipidemia type Overview: Medications: not yet discussed Lab Results  Component Value Date   HDL 76.50 09/25/2022   CHOLHDL 3 09/25/2022   Lab Results  Component Value Date   LDLCALC 124 (H) 09/25/2022   Lab Results  Component Value Date   TRIG 102.0 09/25/2022   Lab Results  Component Value Date   CHOL 221 (H) 09/25/2022   The 10-year ASCVD risk score (Arnett DK, et al., 2019) is: 2%   Values used to calculate the score:     Age: 30 years     Sex: Female     Is Non-Hispanic African American: No     Diabetic: No     Tobacco smoker: No     Systolic Blood Pressure: 110 mmHg     Is BP treated: Yes     HDL Cholesterol: 76.5 mg/dL     Total Cholesterol: 221 mg/dL Lab  Results  Component Value Date   ALT 99 (H) 09/25/2022   AST 40 (H) 09/25/2022   ALKPHOS 47 09/25/2022   TSH 1.31 11/21/2022       Hypertension, unspecified type Overview:      Generalized anxiety disorder Overview: November 28, 2022 interim history:  seeing psychiatry on 11th, out of Klonopin on the 15th  Prior history: Gets really nervous with arguing or with home purchasing Long term Klonopin Diagnosis uncertain   Memory impairment Overview: Associated with family history dementia and also dependence on clonazepam. Sleep study N/A     Subcutaneous nodule Overview: November 28, 2022 interim history:   patient reports no growth or change  in the nodules.  Suspect(s) lymphadenopathy cervical right and right flank was evaluated by [facility] [place] [facility] and deemed benign, not worth biopsy at that time   Right upper quadrant abdominal tenderness without rebound tenderness Overview: November 28, 2022 interim history:   mostly gone on ppi  Found on exam 09/24/21, not reported prior  Associated with MRI showing hemorrhagic liver cysts Later peptic ulcer disease was discovered by gastrointestinal.   Treatment plan discussed and reviewed in detail. Explained medication safety and potential side effects.  Answered all patient questions and confirmed understanding and comfort with the plan. Encouraged patient to contact our office if they have any questions or concerns. Agreed on patient returning to office if symptoms worsen, persist, or new symptoms develop. Discussed precautions in case of needing to visit the Emergency Department.         Clinical Presentation:    57 y.o. female here today for Anxiety and 1 month follow-up  Based on [employer] AI conversational text extraction:  Discussed the use of AI scribe software for clinical note transcription with the patient, who gave verbal consent to proceed.  History of Present Illness   The patient, with a history of significant weight loss, presents for a follow-up visit. The weight loss, initially of unknown etiology, is now attributed to a recently diagnosed deep crater ulcer. The patient also mentions experiencing a high level of daily stress, which they believe may have contributed to the development of the ulcer.  The patient has seen multiple specialists recently, including an endocrinologist, who found no abnormalities in their blood work, and a psychiatrist, who prescribed a medication that caused a rash.  The patient also reports persistent right knee and lower back pain, which they describe as severe at times. They have been managing the pain with Tylenol after being advised to  stop taking Advil due to its potential contribution to the ulcer. The patient expresses a desire for further investigation and management of these symptoms.  The patient also mentions a recent diagnosis of liver cysts, which are currently believed to be benign and are being monitored. They also report having a colonoscopy in 2022, during which a small polyp was found and removed. The polyp was subsequently determined to be benign.  Despite the significant weight loss, the patient's weight has remained stable recently. They attribute this stability to the use of Remeron. The patient expresses a desire to regain some weight, as they feel uncomfortable with their current weight loss. They also report feeling constantly fatigued and lacking energy.  The patient is scheduled for upcoming appointments with a cardiologist due to findings of plaque in their arteries and a neurologist for previously reported dizziness, which has since resolved.        Reviewed chart data: Active Ambulatory Problems    Diagnosis  Date Noted   Hyperlipidemia 09/25/2022   Benzodiazepine dependence (HCC) 09/25/2022   GERD (gastroesophageal reflux disease) 09/25/2022   Lumbago of lumbar region with sciatica 09/25/2022   Podagra 09/25/2022   Hypertension 09/25/2022   Generalized anxiety disorder 09/25/2022   Memory impairment 09/25/2022   Subcutaneous nodule 09/25/2022   Unintentional weight loss 09/25/2022   RUQ abdominal tenderness 09/25/2022   Cystic disease of liver 09/25/2022   Dizziness 10/09/2022   Right-sided chest pain 10/09/2022   Cyst of gallbladder 10/09/2022   Aortic atherosclerosis (HCC) 10/12/2022   Abnormal cardiac CT angiography 10/12/2022   History of colon polyps 10/12/2022   DDD (degenerative disc disease), thoracolumbar 10/25/2022   Moderate episode of recurrent major depressive disorder (HCC) 10/26/2022   Long-term current use of benzodiazepine 10/26/2022   Stress ulcer of stomach 11/28/2022    Resolved Ambulatory Problems    Diagnosis Date Noted   Carpal tunnel syndrome 09/25/2022   Pressure ulcer 11/28/2022   Past Medical History:  Diagnosis Date   Anxiety    Bipolar 1 disorder (HCC)    Depression    Depression    Migraine     Outpatient Medications Prior to Visit  Medication Sig   acetaminophen (TYLENOL) 325 MG tablet Take 650 mg by mouth every 6 (six) hours as needed.   atorvastatin (LIPITOR) 40 MG tablet Take 1 tablet (40 mg total) by mouth daily.   clonazePAM (KLONOPIN) 2 MG tablet Take 2 mg by mouth at bedtime.    cyclobenzaprine (FLEXERIL) 10 MG tablet Take 1 tablet (10 mg total) by mouth daily as needed for muscle spasms.   famotidine-calcium carbonate-magnesium hydroxide (PEPCID COMPLETE) 10-800-165 MG chewable tablet Chew 1 tablet by mouth daily as needed.   gabapentin (NEURONTIN) 300 MG capsule Take 1 capsule (300 mg total) by mouth 3 (three) times daily.   lisinopril-hydrochlorothiazide (PRINZIDE,ZESTORETIC) 10-12.5 MG tablet Take 1 tablet by mouth daily.   mirtazapine (REMERON) 7.5 MG tablet TAKE 1 TABLET BY MOUTH AT BEDTIME.   omeprazole (PRILOSEC) 20 MG capsule Take 20 mg by mouth 2 (two) times daily before a meal.   ondansetron (ZOFRAN ODT) 4 MG disintegrating tablet Take 1 tablet (4 mg total) by mouth every 8 (eight) hours as needed for nausea or vomiting.   [DISCONTINUED] ibuprofen (ADVIL) 200 MG tablet Take 3 tablets (600 mg total) by mouth every 6 (six) hours.   No facility-administered medications prior to visit.         Clinical Data Analysis:   Physical Exam  BP 110/68 (BP Location: Left Arm, Patient Position: Sitting)   Pulse 88   Temp 98 F (36.7 C) (Temporal)   Ht 5\' 3"  (1.6 m)   Wt 125 lb 6.4 oz (56.9 kg)   LMP 12/20/2011   SpO2 100%   BMI 22.21 kg/m  Wt Readings from Last 10 Encounters:  11/28/22 125 lb 6.4 oz (56.9 kg)  11/24/22 125 lb 6.4 oz (56.9 kg)  11/16/22 125 lb 11.2 oz (57 kg)  10/25/22 125 lb 6.4 oz (56.9 kg)   10/09/22 122 lb (55.3 kg)  09/25/22 129 lb 3.2 oz (58.6 kg)  04/18/21 175 lb 11.3 oz (79.7 kg)  05/19/18 173 lb (78.5 kg)   Vital signs reviewed.  Nursing notes reviewed. Weight trend reviewed. Abnormalities and Problem-Specific physical exam findings:  weight healthy, stable now.  General Appearance:  No acute distress appreciable.   Well-groomed, healthy-appearing female.  Well proportioned with no abnormal fat distribution.  Good muscle tone. Skin: Clear and  well-hydrated. Pulmonary:  Normal work of breathing at rest, no respiratory distress apparent. SpO2: 100 %  Musculoskeletal: All extremities are intact.  Neurological:  Awake, alert, oriented, and engaged.  No obvious focal neurological deficits or cognitive impairments.  Sensorium seems unclouded.   Speech is clear and coherent with logical content. Psychiatric:  Appropriate mood, pleasant and cooperative demeanor, thoughtful and engaged during the exam  Results Reviewed:    No results found for any visits on 11/28/22.  Recent Results (from the past 2160 hour(s))  Hepatitis C Antibody     Status: None   Collection Time: 09/25/22  9:53 AM  Result Value Ref Range   Hepatitis C Ab NON-REACTIVE NON-REACTIVE    Comment: . HCV antibody was non-reactive. There is no laboratory  evidence of HCV infection. . In most cases, no further action is required. However, if recent HCV exposure is suspected, a test for HCV RNA (test code 82956) is suggested. . For additional information please refer to http://education.[facility].com/faq/FAQ22v1 (This link is being provided for informational/ educational purposes only.) .   HIV antibody (with reflex)     Status: None   Collection Time: 09/25/22  9:53 AM  Result Value Ref Range   HIV 1&2 Ab, 4th Generation NON-REACTIVE NON-REACTIVE    Comment: HIV-1 antigen and HIV-1/HIV-2 antibodies were not detected. There is no laboratory evidence of HIV infection. Marland Kitchen PLEASE NOTE: This  information has been disclosed to you from records whose confidentiality may be protected by state law.  If your state requires such protection, then the state law prohibits you from making any further disclosure of the information without the specific written consent of the person to whom it pertains, or as otherwise permitted by law. A general authorization for the release of medical or other information is NOT sufficient for this purpose. . For additional information please refer to http://education.[facility].com/faq/FAQ106 (This link is being provided for informational/ educational purposes only.) . Marland Kitchen The performance of this assay has not been clinically validated in patients less than 20 years old. .   Lipid panel     Status: Abnormal   Collection Time: 09/25/22  9:53 AM  Result Value Ref Range   Cholesterol 221 (H) 0 - 200 mg/dL    Comment: ATP III Classification       Desirable:  < 200 mg/dL               Borderline High:  200 - 239 mg/dL          High:  > = 213 mg/dL   Triglycerides 086.5 0.0 - 149.0 mg/dL    Comment: Normal:  <784 mg/dLBorderline High:  150 - 199 mg/dL   HDL 69.62 >95.28 mg/dL   VLDL 41.3 0.0 - 24.4 mg/dL   LDL Cholesterol 010 (H) 0 - 99 mg/dL   Total CHOL/HDL Ratio 3     Comment:                Men          Women1/2 Average Risk     3.4          3.3Average Risk          5.0          4.42X Average Risk          9.6          7.13X Average Risk          15.0  11.0                       NonHDL 144.80     Comment: NOTE:  Non-HDL goal should be 30 mg/dL higher than patient's LDL goal (i.e. LDL goal of < 70 mg/dL, would have non-HDL goal of < 100 mg/dL)  CBC     Status: Abnormal   Collection Time: 09/25/22  9:53 AM  Result Value Ref Range   WBC 7.2 4.0 - 10.5 K/uL   RBC 3.96 3.87 - 5.11 Mil/uL   Platelets 423.0 (H) 150.0 - 400.0 K/uL   Hemoglobin 13.0 12.0 - 15.0 g/dL   HCT 16.1 09.6 - 04.5 %   MCV 93.8 78.0 - 100.0 fl   MCHC 34.9 30.0 -  36.0 g/dL   RDW 40.9 81.1 - 91.4 %  Comp Met (CMET)     Status: Abnormal   Collection Time: 09/25/22  9:53 AM  Result Value Ref Range   Sodium 138 135 - 145 mEq/L   Potassium 3.6 3.5 - 5.1 mEq/L   Chloride 98 96 - 112 mEq/L   CO2 30 19 - 32 mEq/L   Glucose, Bld 96 70 - 99 mg/dL   BUN 17 6 - 23 mg/dL   Creatinine, Ser 7.82 0.40 - 1.20 mg/dL   Total Bilirubin 0.7 0.2 - 1.2 mg/dL   Alkaline Phosphatase 47 39 - 117 U/L   AST 40 (H) 0 - 37 U/L   ALT 99 (H) 0 - 35 U/L   Total Protein 7.2 6.0 - 8.3 g/dL   Albumin 4.9 3.5 - 5.2 g/dL   GFR 95.62 >13.08 mL/min    Comment: Calculated using the CKD-EPI Creatinine Equation (2021)   Calcium 10.1 8.4 - 10.5 mg/dL  Cyclic citrul peptide antibody, IgG     Status: None   Collection Time: 09/25/22  9:53 AM  Result Value Ref Range   Cyclic Citrullin Peptide Ab <65 UNITS    Comment: Reference Range Negative:            <20 Weak Positive:       20-39 Moderate Positive:   40-59 Strong Positive:     >59 .   Rheumatoid Factor     Status: None   Collection Time: 09/25/22  9:53 AM  Result Value Ref Range   Rheumatoid fact SerPl-aCnc <14 <14 IU/mL  ANA     Status: None   Collection Time: 09/25/22  9:53 AM  Result Value Ref Range   Anti Nuclear Antibody (ANA) NEGATIVE NEGATIVE    Comment: ANA IFA is a first line screen for detecting the presence of up to approximately 150 autoantibodies in various autoimmune diseases. A negative ANA IFA result suggests an ANA-associated autoimmune disease is not present at this time, but is not definitive. If there is high clinical suspicion for Sjogren's syndrome, testing for anti-SS-A/Ro antibody should be considered. Anti-Jo-1 antibody should be considered for clinically suspected inflammatory myopathies. . AC-0: Negative . International Consensus on ANA Patterns (SeverTies.uy) . For additional information, please refer to http://education.[facility].com/faq/FAQ177 (This  link is being provided for informational/ educational purposes only.) .   VITAMIN D 25 Hydroxy (Vit-D Deficiency, Fractures)     Status: Abnormal   Collection Time: 09/25/22  9:53 AM  Result Value Ref Range   VITD 26.68 (L) 30.00 - 100.00 ng/mL  B12 and Folate Panel     Status: None   Collection Time: 09/25/22  9:53 AM  Result Value Ref Range  Vitamin B-12 306 211 - 911 pg/mL   Folate 11.2 >5.9 ng/mL  ANCA Screen Reflex Titer     Status: None   Collection Time: 09/25/22  9:53 AM  Result Value Ref Range   ANCA SCREEN Negative Negative    Comment: . ANCA Screen includes evaluation for p-ANCA, c-ANCA and atypical p-ANCA. A positive ANCA screen reflexes to titer and pattern(s), e.g., cytoplasmic pattern (c-ANCA), perinuclear pattern (p-ANCA), or atypical p-ANCA pattern. c-ANCA and p-ANCA are observed in vasculitis, whereas atypical p-ANCA is observed in IBD (Inflammatory Bowel Disease). Atypical p-ANCA is detected in about 55% to 80% of patients with ulcerative colitis but only 5% to 25% of patients with Crohn's disease. .   Thyroid Panel With TSH     Status: None   Collection Time: 09/25/22  9:59 AM  Result Value Ref Range   T3 Uptake 33 22 - 35 %   T4, Total 7.7 5.1 - 11.9 mcg/dL   Free Thyroxine Index 2.5 1.4 - 3.8   TSH 0.63 0.40 - 4.50 mIU/L  Cytology - PAP( [facility])     Status: None   Collection Time: 11/16/22  4:52 PM  Result Value Ref Range   High risk HPV Negative    Adequacy      Satisfactory for evaluation; transformation zone component PRESENT.   Diagnosis      - Negative for intraepithelial lesion or malignancy (NILM)   Comment Atrophic changes are present.    Comment Normal Reference Range HPV - Negative   POCT urinalysis dip (device)     Status: None   Collection Time: 11/16/22  5:22 PM  Result Value Ref Range   Glucose, UA NEGATIVE NEGATIVE mg/dL   Bilirubin Urine NEGATIVE NEGATIVE   Ketones, ur NEGATIVE NEGATIVE mg/dL   Specific Gravity, Urine  1.010 1.005 - 1.030   Hgb urine dipstick NEGATIVE NEGATIVE   pH 7.0 5.0 - 8.0   Protein, ur NEGATIVE NEGATIVE mg/dL   Urobilinogen, UA 0.2 0.0 - 1.0 mg/dL   Nitrite NEGATIVE NEGATIVE   Leukocytes,Ua NEGATIVE NEGATIVE    Comment: Biochemical Testing Only. Please order routine urinalysis from main lab if confirmatory testing is needed.  Basic metabolic panel     Status: None   Collection Time: 11/21/22 10:00 AM  Result Value Ref Range   Sodium 136 135 - 145 mEq/L   Potassium 4.1 3.5 - 5.1 mEq/L   Chloride 97 96 - 112 mEq/L   CO2 30 19 - 32 mEq/L   Glucose, Bld 93 70 - 99 mg/dL   BUN 15 6 - 23 mg/dL   Creatinine, Ser 1.61 0.40 - 1.20 mg/dL   GFR 09.60 >45.40 mL/min    Comment: Calculated using the CKD-EPI Creatinine Equation (2021)   Calcium 9.9 8.4 - 10.5 mg/dL  TSH     Status: None   Collection Time: 11/21/22 10:00 AM  Result Value Ref Range   TSH 1.31 0.35 - 5.50 uIU/mL  T4, free     Status: None   Collection Time: 11/21/22 10:00 AM  Result Value Ref Range   Free T4 0.76 0.60 - 1.60 ng/dL    Comment: Specimens from patients who are undergoing biotin therapy and /or ingesting biotin supplements may contain high levels of biotin.  The higher biotin concentration in these specimens interferes with this Free T4 assay.  Specimens that contain high levels  of biotin may cause false high results for this Free T4 assay.  Please interpret results in light of  the total clinical presentation of the patient.      No image results found.   CT Abdomen Pelvis W Contrast  Result Date: 10/12/2022 CLINICAL DATA:  Right-sided chest pain with positive D-dimer levels. Pulmonary embolism suspected, low to intermediate probability. Unintentional weight loss with bruising and vaginal spotting. Evaluate for malignancy. EXAM: CT ANGIOGRAPHY CHEST CT ABDOMEN AND PELVIS WITH CONTRAST TECHNIQUE: Multidetector CT imaging of the chest was performed using the standard protocol during bolus administration of  intravenous contrast. Multiplanar CT image reconstructions and MIPs were obtained to evaluate the vascular anatomy. Multidetector CT imaging of the abdomen and pelvis was performed using the standard protocol during bolus administration of intravenous contrast. RADIATION DOSE REDUCTION: This exam was performed according to the departmental dose-optimization program which includes automated exposure control, adjustment of the mA and/or kV according to patient size and/or use of iterative reconstruction technique. CONTRAST:  75mL OMNIPAQUE IOHEXOL 350 MG/ML SOLN COMPARISON:  Chest radiographs 05/19/2018. Abdominal CT 07/13/2011, abdominal MRI 07/24/2022 and pelvic ultrasound 10/10/2022. FINDINGS: CTA CHEST FINDINGS Cardiovascular: The pulmonary arteries are well opacified with contrast to the level of the subsegmental branches. There is no evidence of acute pulmonary embolism. No acute systemic arterial abnormalities are identified. There is minimal aortic atherosclerosis. 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 There is a small amount of intermediate see pericardial fluid versus thickening. The heart size is normal. Mediastinum/Nodes: There are no enlarged mediastinal, hilar or axillary lymph nodes. The thyroid gland, trachea and esophagus demonstrate no significant findings. Lungs/Pleura: No pleural effusion or pneumothorax. The lungs are clear. Musculoskeletal/Chest wall: No chest wall mass or suspicious osseous findings. Mild thoracic spondylosis. CT ABDOMEN AND PELVIS FINDINGS Hepatobiliary: The liver is normal in density without suspicious focal abnormality. Multiple hepatic cysts are again demonstrated. Some of these have enlarged from the previous examinations, although the largest remaining cyst in the left hepatic lobe has decreased in size from the remote CT, currently measuring 2.4 cm on image 9/7 (previously 4.8 cm). Current size is similar to recent MRI. No evidence of gallstones,  gallbladder wall thickening or biliary dilatation. Pancreas: Unremarkable. No pancreatic ductal dilatation or surrounding inflammatory changes. Spleen: Normal in size without focal abnormality. Adrenals/Urinary Tract: Both adrenal glands appear normal. No evidence of urinary tract calculus, suspicious renal lesion or hydronephrosis. The bladder appears normal for its degree of distention. Stomach/Bowel: No enteric contrast administered. The stomach appears unremarkable for its degree of distension. No evidence of bowel wall thickening, distention or surrounding inflammatory change. The appendix appears normal. Mildly prominent stool in the proximal colon. Vascular/Lymphatic: There are no enlarged abdominal or pelvic lymph nodes. Mild aortoiliac atherosclerosis. No acute vascular findings. Reproductive: The uterus and ovaries appear unremarkable. No adnexal mass. Other: No evidence of abdominal wall mass or hernia. No ascites. Musculoskeletal: No acute or significant osseous findings. Lower lumbar spondylosis with disc space narrowing, endplate osteophytes and facet hypertrophy. Review of the MIP images confirms the above findings. IMPRESSION: 1. No evidence of acute pulmonary embolism or other acute chest findings. 2. No evidence of malignancy within the chest, abdomen or pelvis. 3. Small intermediate density pericardial effusion versus thickening. Consider further evaluation with echocardiography. 4. Aberrant right subclavian artery. 5. Mild lower lumbar spondylosis. 6.  Aortic Atherosclerosis (ICD10-I70.0). Electronically Signed   By: Carey Bullocks M.D.   On: 10/12/2022 14:30   CT Angio Chest Pulmonary Embolism (PE) W or WO Contrast  Result Date: 10/12/2022 CLINICAL DATA:  Right-sided chest pain with positive D-dimer levels. Pulmonary  embolism suspected, low to intermediate probability. Unintentional weight loss with bruising and vaginal spotting. Evaluate for malignancy. EXAM: CT ANGIOGRAPHY CHEST CT  ABDOMEN AND PELVIS WITH CONTRAST TECHNIQUE: Multidetector CT imaging of the chest was performed using the standard protocol during bolus administration of intravenous contrast. Multiplanar CT image reconstructions and MIPs were obtained to evaluate the vascular anatomy. Multidetector CT imaging of the abdomen and pelvis was performed using the standard protocol during bolus administration of intravenous contrast. RADIATION DOSE REDUCTION: This exam was performed according to the departmental dose-optimization program which includes automated exposure control, adjustment of the mA and/or kV according to patient size and/or use of iterative reconstruction technique. CONTRAST:  75mL OMNIPAQUE IOHEXOL 350 MG/ML SOLN COMPARISON:  Chest radiographs 05/19/2018. Abdominal CT 07/13/2011, abdominal MRI 07/24/2022 and pelvic ultrasound 10/10/2022. FINDINGS: CTA CHEST FINDINGS Cardiovascular: The pulmonary arteries are well opacified with contrast to the level of the subsegmental branches. There is no evidence of acute pulmonary embolism. No acute systemic arterial abnormalities are identified. There is minimal aortic atherosclerosis. 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 There is a small amount of intermediate see pericardial fluid versus thickening. The heart size is normal. Mediastinum/Nodes: There are no enlarged mediastinal, hilar or axillary lymph nodes. The thyroid gland, trachea and esophagus demonstrate no significant findings. Lungs/Pleura: No pleural effusion or pneumothorax. The lungs are clear. Musculoskeletal/Chest wall: No chest wall mass or suspicious osseous findings. Mild thoracic spondylosis. CT ABDOMEN AND PELVIS FINDINGS Hepatobiliary: The liver is normal in density without suspicious focal abnormality. Multiple hepatic cysts are again demonstrated. Some of these have enlarged from the previous examinations, although the largest remaining cyst in the left hepatic lobe has decreased  in size from the remote CT, currently measuring 2.4 cm on image 9/7 (previously 4.8 cm). Current size is similar to recent MRI. No evidence of gallstones, gallbladder wall thickening or biliary dilatation. Pancreas: Unremarkable. No pancreatic ductal dilatation or surrounding inflammatory changes. Spleen: Normal in size without focal abnormality. Adrenals/Urinary Tract: Both adrenal glands appear normal. No evidence of urinary tract calculus, suspicious renal lesion or hydronephrosis. The bladder appears normal for its degree of distention. Stomach/Bowel: No enteric contrast administered. The stomach appears unremarkable for its degree of distension. No evidence of bowel wall thickening, distention or surrounding inflammatory change. The appendix appears normal. Mildly prominent stool in the proximal colon. Vascular/Lymphatic: There are no enlarged abdominal or pelvic lymph nodes. Mild aortoiliac atherosclerosis. No acute vascular findings. Reproductive: The uterus and ovaries appear unremarkable. No adnexal mass. Other: No evidence of abdominal wall mass or hernia. No ascites. Musculoskeletal: No acute or significant osseous findings. Lower lumbar spondylosis with disc space narrowing, endplate osteophytes and facet hypertrophy. Review of the MIP images confirms the above findings. IMPRESSION: 1. No evidence of acute pulmonary embolism or other acute chest findings. 2. No evidence of malignancy within the chest, abdomen or pelvis. 3. Small intermediate density pericardial effusion versus thickening. Consider further evaluation with echocardiography. 4. Aberrant right subclavian artery. 5. Mild lower lumbar spondylosis. 6.  Aortic Atherosclerosis (ICD10-I70.0). Electronically Signed   By: Carey Bullocks M.D.   On: 10/12/2022 14:30   US PELVIC COMPLETE WITH TRANSVAGINAL  Result Date: 10/10/2022 CLINICAL DATA:  Weight loss, postmenopausal vaginal bleeding EXAM: TRANSABDOMINAL AND TRANSVAGINAL ULTRASOUND OF PELVIS  TECHNIQUE: Both transabdominal and transvaginal ultrasound examinations of the pelvis were performed. Transabdominal technique was performed for global imaging of the pelvis including uterus, ovaries, adnexal regions, and pelvic cul-de-sac. It was necessary to proceed with endovaginal exam  following the transabdominal exam to visualize the endometrium and ovaries bilaterally. COMPARISON:  08/10/2011 FINDINGS: Uterus Measurements: 4.7 x 1.9 x 4.0 cm = volume: 18 mL. No fibroids or other mass visualized. Endometrium Thickness: 2 mm.  No focal abnormality visualized. Right ovary Measurements: 1.8 x 1.7 x 1.3 cm = volume: 2 mL. Normal appearance/no adnexal mass. Left ovary Measurements: 1.5 x 0.9 x 1.3 cm = volume: 1 mL. Normal appearance/no adnexal mass. Other findings No abnormal free fluid. IMPRESSION: 1. Normal pelvic sonogram. Electronically Signed   By: Helyn Numbers M.D.   On: 10/10/2022 17:31       Signed: Lula Olszewski, MD 11/28/2022 1:08 PM

## 2022-11-28 NOTE — Patient Instructions (Addendum)
It was a pleasure seeing you today! Your health and satisfaction are our top priorities.   Glenetta Hew, MD  Get [facility] Cancel neurology and MRI since dizziness gone Complete cardiology due to coronary plaque Stay on Remeron No ibuprofen or aleve, try Celebrex Get endoscopy records No blood thinners for now Pepcid complete for any heartburn Stay on Remeron Follow up 1-3 months  * give stool sample for Helicobacter*  VISIT SUMMARY:  During our visit, we discussed your recent weight loss, which we now understand is likely due to a deep crater ulcer in your stomach. We also talked about your high stress levels, a rash caused by a medication prescribed by your psychiatrist, and your severe knee and lower back pain. We touched on your recent diagnosis of benign liver cysts and the small benign polyp that was removed during your colonoscopy. Despite your significant weight loss, your weight has remained stable recently, which you attribute to Remeron. You expressed a desire to regain some weight and mentioned feeling constantly fatigued and lacking energy. We also discussed your upcoming appointments with a cardiologist and a neurologist.  YOUR PLAN:  -GASTRIC ULCER: You have a deep ulcer in your stomach, likely caused by stress and the use of certain pain medications. We will continue your current medication, Omeprazole, and perform a stool test to rule out a bacterial cause. We have scheduled a repeat endoscopy for January 08, 2023.  -UNEXPLAINED WEIGHT LOSS: Your weight loss is likely related to your stomach ulcer. We will continue using Remeron to maintain your weight until the ulcer heals and you can start gaining weight again.  -ARTHRITIS: You've been experiencing body aches and pains, particularly in your left knee and lower back. We will start you on Celebrex for pain management, order a lumbar x-ray, and refer you for physical therapy for your lower back pain.  -PSYCHIATRIC MANAGEMENT: You  developed a rash after starting a medication prescribed by your psychiatrist. Please follow up with your psychiatrist as scheduled.  -GENERAL HEALTH MAINTENANCE: We will schedule a mammogram for you and continue monitoring your liver cysts, which are currently considered benign. You should continue your follow-up with cardiology for the plaque identified in your arteries. If your dizziness has resolved, you can cancel your neurology appointment and brain MRI. We recommend repeating your colonoscopy in 2027.  INSTRUCTIONS:  Please continue taking your current medications as prescribed. Make sure to schedule and attend all recommended appointments and tests, including your mammogram, endoscopy, and follow-up with your psychiatrist. If your dizziness has resolved, you can cancel your neurology appointment and brain MRI. Please start taking Celebrex for your arthritis pain and attend physical therapy for your lower back pain. We will continue monitoring your liver cysts and recommend repeating your colonoscopy in 2027.  Next Steps:  [x]  Early Intervention: Schedule sooner appointment, call our on-call services, or go to emergency room if there is Increase in pain or discomfort New or worsening symptoms Sudden or severe changes in your health [x]  Flexible Follow-Up: We recommend a Return in about 1 month (around 12/28/2022). for optimal routine care. This allows for progress monitoring and treatment adjustments. [x]  Preventive Care: Schedule your annual preventive care visit! It's typically covered by insurance and helps identify potential health issues early. [x]  Lab & X-ray Appointments: Incomplete tests scheduled today, or call to schedule. X-rays: [facility] at [facility] [place](M-F, 8:30am-noon or 1pm-5pm). [x]  Medical Information Release: Sign a release form at front desk to obtain relevant  medical information we don't have.  Making the Most of Our Focused (20 minute)  Appointments:  [x]   Clearly state your top concerns at the beginning of the visit to focus our discussion [x]   If you anticipate you will need more time, please inform the front desk during scheduling - we can book multiple appointments in the same week. [x]   If you have transportation problems- use our convenient video appointments or ask about transportation support. [x]   We can get down to business faster if you use MyChart to update information before the visit and submit non-urgent questions before your visit. Thank you for taking the time to provide details through MyChart.  Let our nurse know and she can import this information into your encounter documents.  Arrival and Wait Times: [x]   Arriving on time ensures that everyone receives prompt attention. [x]   Early morning (8a) and afternoon (1p) appointments tend to have shortest wait times. [x]   Unfortunately, we cannot delay appointments for late arrivals or hold slots during phone calls.  Getting Answers and Following Up  [x]   Simple Questions & Concerns: For quick questions or basic follow-up after your visit, reach [place] at [phone] or MyChart messaging. [x]   Complex Concerns: If your concern is more complex, scheduling an appointment might be best. Discuss this with the staff to find the most suitable option. [x]   Lab & Imaging Results: We'll contact you directly if results are abnormal or you don't use MyChart. Most normal results will be on MyChart within 2-3 business days, with a review message from Dr. Jon Billings. Haven't heard back in 2 weeks? Need results sooner? Contact us at [phone]. [x]   Referrals: Our referral coordinator will manage specialist referrals. The specialist's office should contact you within 2 weeks to schedule an appointment. Call us if you haven't heard from them after 2 weeks.  Staying Connected  [x]   MyChart: Activate your MyChart for the fastest way to access results and message [place]. See the last  page of this paperwork for instructions on how to activate.  Bring to Your Next Appointment  [x]   Medications: Please bring all your medication bottles to your next appointment to ensure we have an accurate record of your prescriptions. [x]   Health Diaries: If you're monitoring any health conditions at home, keeping a diary of your readings can be very helpful for discussions at your next appointment.  Billing  [x]   X-ray & Lab Orders: These are billed by separate companies. Contact the invoicing company directly for questions or concerns. [x]   Visit Charges: Discuss any billing inquiries with our administrative services team.  Your Satisfaction Matters  [x]   Share Your Experience: We strive for your satisfaction! If you have any complaints, or preferably compliments, please let Dr. Jon Billings know directly or contact our Practice Administrators, Edwena Felty or [employer], by asking at the front desk.   Reviewing Your Records  [x]   Review this early draft of your clinical encounter notes below and the final encounter summary tomorrow on MyChart after its been completed.   Unintentional weight loss Assessment & Plan: maybe due to ulcer, stable Advised patient to try to maintain current weight. Sarcoidosis and lymphoma seem less likely patient reports no change in size of nodules Will try to get gastrointestinal records    Stress ulcer of stomach Assessment & Plan: Be sure to use as needed Pepcid Taking omeprazole 40 mg daily.  Orders: -     Celecoxib; Take 1 capsule (100 mg total)  by mouth 2 (two) times daily.  Dispense: 180 capsule; Refill: 3 -     H. pylori antigen, stool  Liver cyst  Cystic disease of liver  Arthritis -     Celecoxib; Take 1 capsule (100 mg total) by mouth 2 (two) times daily.  Dispense: 180 capsule; Refill: 3 -     DG Lumbar Spine Complete; Future -     Ambulatory referral to Physical Therapy  Lumbar pain -     Celecoxib; Take 1 capsule (100 mg  total) by mouth 2 (two) times daily.  Dispense: 180 capsule; Refill: 3 -     DG Lumbar Spine Complete; Future -     Ambulatory referral to Physical Therapy  Benzodiazepine dependence (HCC) Assessment & Plan: Patient concerned about discontinuation xanax.   Advised patient that I would fill only if psychiatry doesn't take over, and it would be tapering.  Ideally, the taper is managed by psychiatry who can adjust other psychiatric medications alongside it.   She has family history dementia and understands this risk but worried coming off xanax will be difficult/impossible.   Hyperlipidemia, unspecified hyperlipidemia type  Hypertension, unspecified type  Generalized anxiety disorder  Memory impairment  Subcutaneous nodule  Right upper quadrant abdominal tenderness without rebound tenderness

## 2022-11-28 NOTE — Assessment & Plan Note (Signed)
Patient concerned about discontinuation xanax.   Advised patient that I would fill only if psychiatry doesn't take over, and it would be tapering.  Ideally, the taper is managed by psychiatry who can adjust other psychiatric medications alongside it.   She has family history dementia and understands this risk but worried coming off xanax will be difficult/impossible.

## 2022-11-28 NOTE — Assessment & Plan Note (Signed)
Be sure to use as needed Pepcid Taking omeprazole 40 mg daily.

## 2022-11-28 NOTE — Assessment & Plan Note (Signed)
Presumably due to peptic ulcer disease, need report from endoscopy Seems to be resolving Encouraged continuing with proton pump inhibitor (PPI) stomach acid reducer, Pepcid complete as needed, no NSAID. Never finished h.pylori stool test, encouraged patient to get done today

## 2022-11-28 NOTE — Assessment & Plan Note (Addendum)
maybe due to ulcer, stable Advised patient to try to maintain current weight. Sarcoidosis and lymphoma seem less likely patient reports no change in size of nodules Will try to get gastrointestinal records

## 2022-11-30 ENCOUNTER — Other Ambulatory Visit: Payer: Medicaid Other

## 2022-11-30 DIAGNOSIS — K259 Gastric ulcer, unspecified as acute or chronic, without hemorrhage or perforation: Secondary | ICD-10-CM | POA: Diagnosis not present

## 2022-12-01 ENCOUNTER — Ambulatory Visit (INDEPENDENT_AMBULATORY_CARE_PROVIDER_SITE_OTHER)
Admission: RE | Admit: 2022-12-01 | Discharge: 2022-12-01 | Disposition: A | Discharge status: Home / Self Care | Payer: Medicaid Other | Source: Ambulatory Visit | Attending: Internal Medicine | Admitting: Internal Medicine

## 2022-12-01 ENCOUNTER — Ambulatory Visit: Payer: Medicaid Other | Admitting: Neurology

## 2022-12-01 DIAGNOSIS — M1990 Unspecified osteoarthritis, unspecified site: Secondary | ICD-10-CM | POA: Diagnosis not present

## 2022-12-01 DIAGNOSIS — M545 Low back pain, unspecified: Secondary | ICD-10-CM | POA: Diagnosis not present

## 2022-12-02 LAB — H. PYLORI ANTIGEN, STOOL: H pylori Ag, Stl: NEGATIVE

## 2022-12-05 ENCOUNTER — Encounter (HOSPITAL_COMMUNITY): Payer: Self-pay | Admitting: Physician Assistant

## 2022-12-05 ENCOUNTER — Encounter (HOSPITAL_BASED_OUTPATIENT_CLINIC_OR_DEPARTMENT_OTHER): Payer: Self-pay | Admitting: Cardiology

## 2022-12-05 ENCOUNTER — Ambulatory Visit (INDEPENDENT_AMBULATORY_CARE_PROVIDER_SITE_OTHER): Payer: Medicaid Other | Admitting: Cardiology

## 2022-12-05 ENCOUNTER — Ambulatory Visit (HOSPITAL_COMMUNITY): Payer: Medicaid Other | Admitting: Physician Assistant

## 2022-12-05 VITALS — BP 143/66 | HR 79 | Temp 98.5°F | Ht 63.0 in | Wt 127.0 lb

## 2022-12-05 VITALS — BP 110/66 | HR 79 | Ht 63.0 in | Wt 128.1 lb

## 2022-12-05 DIAGNOSIS — I700 Atherosclerosis of aorta: Secondary | ICD-10-CM

## 2022-12-05 DIAGNOSIS — Z5181 Encounter for therapeutic drug level monitoring: Secondary | ICD-10-CM | POA: Diagnosis not present

## 2022-12-05 DIAGNOSIS — Z79899 Other long term (current) drug therapy: Secondary | ICD-10-CM

## 2022-12-05 DIAGNOSIS — Z712 Person consulting for explanation of examination or test findings: Secondary | ICD-10-CM | POA: Diagnosis not present

## 2022-12-05 DIAGNOSIS — M791 Myalgia, unspecified site: Secondary | ICD-10-CM | POA: Diagnosis not present

## 2022-12-05 DIAGNOSIS — Z7189 Other specified counseling: Secondary | ICD-10-CM | POA: Diagnosis not present

## 2022-12-05 DIAGNOSIS — T466X5A Adverse effect of antihyperlipidemic and antiarteriosclerotic drugs, initial encounter: Secondary | ICD-10-CM | POA: Diagnosis not present

## 2022-12-05 DIAGNOSIS — F411 Generalized anxiety disorder: Secondary | ICD-10-CM

## 2022-12-05 DIAGNOSIS — E7800 Pure hypercholesterolemia, unspecified: Secondary | ICD-10-CM | POA: Diagnosis not present

## 2022-12-05 DIAGNOSIS — F331 Major depressive disorder, recurrent, moderate: Secondary | ICD-10-CM | POA: Diagnosis not present

## 2022-12-05 DIAGNOSIS — I10 Essential (primary) hypertension: Secondary | ICD-10-CM | POA: Diagnosis not present

## 2022-12-05 MED ORDER — ROSUVASTATIN CALCIUM 10 MG PO TABS
ORAL_TABLET | ORAL | 3 refills | Status: DC
Start: 2022-12-05 — End: 2023-04-11

## 2022-12-05 MED ORDER — CARBAMAZEPINE 100 MG PO CHEW
100.0000 mg | CHEWABLE_TABLET | Freq: Two times a day (BID) | ORAL | 1 refills | Status: DC
Start: 2022-12-05 — End: 2023-01-16

## 2022-12-05 MED ORDER — CLONAZEPAM 2 MG PO TABS: 2.0000 mg | ORAL_TABLET | Freq: Every day | ORAL | 1 refills | Status: DC

## 2022-12-05 NOTE — Patient Instructions (Addendum)
Stop the atorvastatin. We will start rosuvastatin. Start with 10 mg rosuvastatin at night for one month. If you are doing well on this dose, increase to 20 mg daily. We will recheck FASTING blood work in 3 mos  FOLLOW UP IN 1 YEAR

## 2022-12-05 NOTE — Progress Notes (Signed)
Cardiology Office Note:  .   Date:  12/05/2022  ID:  Amanda White, Amanda White Apr 29, 1966, MRN [id number] PCP: Lula Olszewski, MD  [facility] Providers Cardiologist:  Jodelle Red, MD {  History of Present Illness: .   Amanda White is a 57 y.o. female with PMH gastric ulcer, unintentional weight loss, hypertension, aortic atherosclerosis who is referred for cardiology consultation for evaluation of aortic atherosclerosis at the request of Dr. Jon Billings.  Pertinent CV history: aortic atherosclerosis on CT 09/2022. Well controlled hypertension.  Today: Lost 56 lbs unintentionally, now starting to slowly gain weight back. No clear etiology. Under significant stress. Here to discuss plaque seen on her CT.  No prior heart issues. Has had well controlled blood pressure for years, unless she is in pain or stressed.   On atorvastatin for years, had myalgias before but worse on statin, especially increased dose. Worse at night and first thing in the morning. LDL was 124 in April, increased from 20 mg to 40 mg at that time.  Mother had history of MI and CVA, died age 99 of cardiac arrest (came in with pneumonia, was in the hospital for over a month before she passed). Maternal aunt died of MI. Maternal grandfather and all 5 of his brothers had heart issues.   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   ROS: Denies chest pain, shortness of breath at rest or with normal exertion. No PND, orthopnea, LE edema or unexpected weight gain. No syncope or palpitations. ROS otherwise negative except as noted.   Studies Reviewed: Marland Kitchen    EKG:  NSR at 79 bpm  CT angio 10/12/22:  There is minimal aortic atherosclerosis. 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 There is a small amount of intermediate see pericardial fluid versus thickening. The heart size is normal.  Physical Exam:   VS:  BP 110/66 (BP Location: Left Arm, Patient Position: Sitting, Cuff Size:  Normal)   Pulse 79   Ht 5\' 3"  (1.6 m)   Wt 128 lb 1.6 oz (58.1 kg)   LMP 12/20/2011   BMI 22.69 kg/m    Wt Readings from Last 3 Encounters:  12/05/22 128 lb 1.6 oz (58.1 kg)  11/28/22 125 lb 6.4 oz (56.9 kg)  11/24/22 125 lb 6.4 oz (56.9 kg)    GEN: Well nourished, well developed in no acute distress HEENT: Normal, moist mucous membranes NECK: No JVD CARDIAC: regular rhythm, normal S1 and S2, no rubs or gallops. No murmur. VASCULAR: Radial and DP pulses 2+ bilaterally. No carotid bruits RESPIRATORY:  Clear to auscultation without rales, wheezing or rhonchi  ABDOMEN: Soft, non-tender, non-distended MUSCULOSKELETAL:  Ambulates independently SKIN: Warm and dry, no edema NEUROLOGIC:  Alert and oriented x 3. No focal neuro deficits noted. PSYCHIATRIC:  Normal affect    ASSESSMENT AND PLAN: .   Aortic atherosclerosis Family history of heart disease Hypercholesterolemia Statin myalgia -no aspirin given gastric ulcer -now on atorvastatin, though has muscle aches. Changing to rosuvastatin today. Will start at 10 mg daily dose, increase to 20 mg dose if well tolerated. Recheck lipids/LFTs in 3 mos. -reviewed test results today  Hypertension -well controlled, continue current meds.  CV risk counseling and prevention -recommend heart healthy/Mediterranean diet, with whole grains, fruits, vegetable, fish, lean meats, nuts, and olive oil. Limit salt. -recommend moderate walking, 3-5 times/week for 30-50 minutes each session. Aim for at least 150 minutes.week. Goal should be pace of 3 miles/hours, or walking 1.5 miles in 30  minutes -recommend avoidance of tobacco products. Avoid excess alcohol. -ASCVD risk score: The 10-year ASCVD risk score (Arnett DK, et al., 2019) is: 2%   Values used to calculate the score:     Age: 89 years     Sex: Female     Is Non-Hispanic African American: No     Diabetic: No     Tobacco smoker: No     Systolic Blood Pressure: 110 mmHg     Is BP treated:  Yes     HDL Cholesterol: 76.5 mg/dL     Total Cholesterol: 221 mg/dL    Dispo: 1 year or sooner as needed  Signed, Jodelle Red, MD   Jodelle Red, MD, PhD, [facility] [place]  [facility]  [place]  [facility] at [facility] at [facility] [address] [place], [place] [address] [phone]

## 2022-12-05 NOTE — Progress Notes (Signed)
BH MD/PA/NP OP Progress Note  12/05/2022 8:33 PM Amanda White  MRN:  [id number]  Chief Complaint:  Chief Complaint  Patient presents with   Follow-up   Medication Management   HPI:   Amanda White is a 57 year old female with a past psychiatric history significant for generalized anxiety disorder and major depressive disorder who presents to [facility] for follow-up and medication management.  Patient is currently being managed on the following psychiatric medication:  Clonazepam 2 mg at bedtime Mirtazapine 7.5 mg at bedtime  Prior to this encounter, patient was previously prescribed Lamictal 25 mg daily for the management of her major depressive disorder.  The medication was discontinued due to patient experiencing a rash when taking the medication.  During the encounter, the patient reported being interested in therapy.  She reports that she does not have very many friends and states that several of her family members either live far away or have passed on.  Patient reports that she just peace.  Patient reports that she is wanting to gain more weight.  Since being placed on mirtazapine, patient reports that she has gained a total of 3 pounds.  Patient denies depression especially when she is around her son.  She reports that whenever she is not around her sons, she endorses some sadness.  She also endorses sadness due to many of her family members being deceased.  She endorses being depressed a few days out of the week.  She also reports being anxious and states that clonazepam has been helpful since being placed on the medication.  A PHQ-9 screen was performed with the patient scoring at 13.  A GAD-7 screen was also performed with the patient scoring a 20.  Patient is alert and oriented x 4, calm, cooperative, and fully engaged in conversation during the encounter.  Patient endorses good mood.  Patient denies suicidal or homicidal  ideations.  She further denies auditory or visual hallucinations and does not appear to be responding to internal/external stimuli.  Patient reports that on most nights, she receives on average 6 to 7 hours of sleep per night.  Patient endorses good appetite and eats on average 3 meals per day.  Patient denies alcohol consumption, tobacco use, and illicit drug use.  Visit Diagnosis:    ICD-10-CM   1. Moderate episode of recurrent major depressive disorder (HCC)  F33.1     2. Generalized anxiety disorder  F41.1 carbamazepine (TEGRETOL) 100 MG chewable tablet    3. Long-term current use of benzodiazepine  Z79.899       Past Psychiatric History:  Patient reports that she has a history of mental health and has been treated for the last 20 years.  She reports that a lot of her mental health extends from her husband.  Patient is diagnosed with generalized anxiety disorder that is being managed by her primary care provider   Patient denies a past history of hospitalization due to mental health   Patient denies a past history of suicide attempts Patient denies a past history of homicide attempts  Past Medical History:  Past Medical History:  Diagnosis Date   Abnormal cardiac CT angiography 10/12/2022   Small intermediate density pericardial effusion versus thickening. Consider further evaluation with echocardiography.   Anxiety    Bipolar 1 disorder (HCC)    Carpal tunnel syndrome 09/25/2022   R sided release 2022, all better now.   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   Depression    Depression    Dizziness 10/09/2022   Saw emergency room 10/03/22   GERD (gastroesophageal reflux disease)    Hypertension    Migraine    Pressure ulcer 11/28/2022    Subcutaneous nodule 09/25/2022    Past Surgical History:  Procedure Laterality Date   CARPAL TUNNEL RELEASE Right 04/18/2021   Procedure: RIGHT ENDOSCOPIC CARPAL TUNNEL RELEASE;  Surgeon: Mack Hook, MD;  Location: [facility];  Service: Orthopedics;  Laterality: Right;  LENTH OF SURGERY: 30 MINUTES   DILATION AND CURETTAGE OF UTERUS      Family Psychiatric History:  Patient reports that her mother's side of the family struggles with mental health.  She reports that her mother's side of the family from [place] and states that many family members saw many of the atrocities during World War II.   Family history of suicide attempts: Patient denies Family history of homicide attempts: Patient denies Family history of substance abuse: Patient reports that her nephew abused heroin.  She reports that her brother uses marijuana frequently.  She reports that her father was an alcoholic  Family History:  Family History  Problem Relation Age of Onset   Hypertension Mother    Stroke Father     Social History:  Social History   Socioeconomic History   Marital status: Married    Spouse name: Not on file   Number of children: Not on file   Years of education: Not on file   Highest education level: Bachelor's degree (e.g., BA, AB, BS)  Occupational History   Not on file  Tobacco Use   Smoking status: Never   Smokeless tobacco: Never  Substance and Sexual Activity   Alcohol use: No    Comment: occas.   Drug use: No   Sexual activity: Not on file  Other Topics Concern   Not on file  Social History Narrative   Not on file   Social Determinants of Health   Financial Resource Strain: Medium Risk (11/27/2022)   Overall Financial Resource Strain (CARDIA)    Difficulty of Paying Living Expenses: Somewhat hard  Food Insecurity: No Food Insecurity (11/27/2022)   Hunger Vital Sign    Worried About Running Out of Food in the Last Year: Never true    Ran Out of Food in the  Last Year: Never true  Transportation Needs: No Transportation Needs (11/27/2022)   PRAPARE - Administrator, Civil Service (Medical): No    Lack of Transportation (Non-Medical): No  Physical Activity: Sufficiently Active (11/27/2022)   Exercise Vital Sign    Days of Exercise per Week: 7 days    Minutes of Exercise per Session: 40 min  Stress: Stress Concern Present (11/27/2022)   [employer] of Occupational Health - Occupational Stress Questionnaire    Feeling of Stress : Very much  Social Connections: Unknown (11/27/2022)   Social Connection and Isolation Panel [NHANES]    Frequency of Communication with Friends and Family: Once a week    Frequency of Social Gatherings with Friends and Family: Never    Attends Religious Services: Patient declined    Database administrator or Organizations: No    Attends Banker Meetings: Not on file    Marital  Status: Married    Allergies: No Known Allergies  Metabolic Disorder Labs: No results found for: "HGBA1C", "MPG" No results found for: "PROLACTIN" Lab Results  Component Value Date   CHOL 221 (H) 09/25/2022   TRIG 102.0 09/25/2022   HDL 76.50 09/25/2022   CHOLHDL 3 09/25/2022   VLDL 20.4 09/25/2022   LDLCALC 124 (H) 09/25/2022   Lab Results  Component Value Date   TSH 1.31 11/21/2022   TSH 0.63 09/25/2022    Therapeutic Level Labs: No results found for: "LITHIUM" No results found for: "VALPROATE" No results found for: "CBMZ"  Current Medications: Current Outpatient Medications  Medication Sig Dispense Refill   carbamazepine (TEGRETOL) 100 MG chewable tablet Chew 1 tablet (100 mg total) by mouth 2 (two) times daily. 60 tablet 1   acetaminophen (TYLENOL) 325 MG tablet Take 650 mg by mouth every 6 (six) hours as needed.     celecoxib (CELEBREX) 100 MG capsule Take 1 capsule (100 mg total) by mouth 2 (two) times daily. 180 capsule 3   clonazePAM (KLONOPIN) 2 MG tablet Take 2 mg by mouth at bedtime.       cyclobenzaprine (FLEXERIL) 10 MG tablet Take 1 tablet (10 mg total) by mouth daily as needed for muscle spasms. 30 tablet 2   famotidine-calcium carbonate-magnesium hydroxide (PEPCID COMPLETE) 10-800-165 MG chewable tablet Chew 1 tablet by mouth daily as needed. 100 tablet 11   gabapentin (NEURONTIN) 300 MG capsule Take 1 capsule (300 mg total) by mouth 3 (three) times daily. 270 capsule 3   lisinopril-hydrochlorothiazide (PRINZIDE,ZESTORETIC) 10-12.5 MG tablet Take 1 tablet by mouth daily.  1   mirtazapine (REMERON) 7.5 MG tablet TAKE 1 TABLET BY MOUTH AT BEDTIME. 90 tablet 1   omeprazole (PRILOSEC) 20 MG capsule Take 20 mg by mouth 2 (two) times daily before a meal.     ondansetron (ZOFRAN ODT) 4 MG disintegrating tablet Take 1 tablet (4 mg total) by mouth every 8 (eight) hours as needed for nausea or vomiting. 20 tablet 0   rosuvastatin (CRESTOR) 10 MG tablet Take 1 tablet (10 mg total) by mouth daily for 30 days, THEN 2 tablets (20 mg total) daily. 90 tablet 3   No current facility-administered medications for this visit.     Musculoskeletal: Strength & Muscle Tone: within normal limits Gait & Station: normal Patient leans: N/A  Psychiatric Specialty Exam: Review of Systems  Psychiatric/Behavioral:  Positive for sleep disturbance. Negative for decreased concentration, dysphoric mood, hallucinations, self-injury and suicidal ideas. The patient is nervous/anxious. The patient is not hyperactive.     Blood pressure (!) 143/66, pulse 79, temperature 98.5 F (36.9 C), temperature source Oral, height 5\' 3"  (1.6 m), weight 127 lb (57.6 kg), last menstrual period 12/20/2011, SpO2 100 %.Body mass index is 22.5 kg/m.  General Appearance: Casual  Eye Contact:  Good  Speech:  Clear and Coherent and Normal Rate  Volume:  Normal  Mood:  Anxious and Depressed  Affect:  Appropriate  Thought Process:  Coherent, Goal Directed, and Descriptions of Associations: Intact  Orientation:  Full (Time,  Place, and Person)  Thought Content: WDL   Suicidal Thoughts:  No  Homicidal Thoughts:  No  Memory:  Immediate;   Good Recent;   Good Remote;   Good  Judgement:  Good  Insight:  Good  Psychomotor Activity:  Normal  Concentration:  Concentration: Good and Attention Span: Good  Recall:  Good  Fund of Knowledge: Good  Language: Good  Akathisia:  No  Handed:  Right  AIMS (if indicated): not done  Assets:  Communication Skills Desire for Improvement Housing Social Support Transportation Vocational/Educational  ADL's:  Intact  Cognition: WNL  Sleep:  Fair   Screenings: GAD-7    Flowsheet Row Clinical Support from 12/05/2022 in [facility] Office Visit from 11/16/2022 in [facility] Office Visit from 10/26/2022 in [facility] Office Visit from 09/25/2022 in [facility]  Total GAD-7 Score 20 17 19 18       PHQ2-9    Flowsheet Row Clinical Support from 12/05/2022 in [facility] Office Visit from 11/16/2022 in [facility] Office Visit from 10/26/2022 in [facility] Office Visit from 09/25/2022 in [facility]  PHQ-2 Total Score 2 6 4 6   PHQ-9 Total Score 10 20 11 22       Flowsheet Row Clinical Support from 12/05/2022 in [facility] Office Visit from 10/26/2022 in [facility] Admission (Discharged) from 04/18/2021 in [facility]  C-SSRS RISK CATEGORY No Risk No Risk No Risk        Assessment and Plan:   Amanda White is a 57 year old female with a past psychiatric history significant for generalized anxiety disorder and major depressive disorder who presents to [facility] for follow-up and medication management.  Prior to  this encounter, patient was placed on Lamictal but had to discontinue the medication after getting a rash.  She denies overt depressive symptoms at this time but states that when she is not around her sons, she does experience some depression.  She reports being interested in therapy due to not having very many friends or not living near family members.  Patient states that she is always anxious but reports that her clonazepam has been helpful in managing her anxiety.  Patient was recommended Tegretol 100 mg 2 times daily for the management of her anxiety and for mood stability.  Patient was agreeable to recommendation.  Patient to be prescribed.  Provider allowed for time at the end of the encounter to discuss potential adverse side effects to patient's medications.  Patient vocalized understanding.  Collaboration of Care: Collaboration of Care: Medication Management AEB provider managing patient's psychiatric medications, Primary Care Provider AEB patient being seen by her primary care provider, Psychiatrist AEB patient being seen by mental health provider at this facility, and Other provider involved in patient's care AEB patient being seen by neurology, cardiology, and OB/GYN  Patient/Guardian was advised Release of Information must be obtained prior to any record release in order to collaborate their care with an outside provider. Patient/Guardian was advised if they have not already done so to contact the registration department to sign all necessary forms in order for [place] to release information regarding their care.   Consent: Patient/Guardian gives verbal consent for treatment and assignment of benefits for services provided during this visit. Patient/Guardian expressed understanding and agreed to proceed.   1. Moderate episode of recurrent major depressive disorder (HCC) Patient to continue taking mirtazapine 7.5 mg at bedtime for the management of her major depressive disorder  2. Generalized  anxiety disorder Patient to continue taking mirtazapine 7.5 mg at bedtime for the management of her general anxiety disorder  - carbamazepine (TEGRETOL) 100 MG chewable tablet; Chew 1 tablet (100 mg total) by mouth 2 (two) times daily.  Dispense: 60 tablet; Refill: 1 - clonazePAM (KLONOPIN) 2 MG tablet; Take 1 tablet (2 mg total) by mouth at bedtime.  Dispense: 30 tablet; Refill: 1  3. Long-term current use of benzodiazepine  Patient to follow-up in 6 weeks Provider spent a total of 29 minutes with the patient/reviewing patient's chart  Meta Hatchet, PA 12/05/2022, 8:33 PM

## 2022-12-06 ENCOUNTER — Telehealth: Payer: Self-pay | Admitting: Internal Medicine

## 2022-12-06 NOTE — Telephone Encounter (Signed)
Pt would like a call back with imaging results 

## 2022-12-08 ENCOUNTER — Encounter: Payer: Self-pay | Admitting: Internal Medicine

## 2022-12-08 DIAGNOSIS — K5900 Constipation, unspecified: Secondary | ICD-10-CM | POA: Insufficient documentation

## 2022-12-08 NOTE — Progress Notes (Signed)
I reviewed the results of this test. I correlated the findings with the patient's recent history and presenting complaints of low back pain  The findings were consistent with the patient's diagnosis of degenerative joint disease lumbar An explanation of the results and any necessary follow-up plan have been communicated to the patient via MyChart.

## 2022-12-12 NOTE — Telephone Encounter (Signed)
Message acknowledged and reviewed.  Provider was able to discuss issues with medication with the patient during her last appointment.  Patient's next appointment with this provider scheduled for 01/16/2023.

## 2022-12-15 NOTE — Telephone Encounter (Signed)
Left message to return call to office.

## 2022-12-15 NOTE — Telephone Encounter (Signed)
Called patient to follow-up on if she received results and she stated that she saw mychart message from Dr. Jon Billings on [place] and will discuss further at her August appt.

## 2022-12-25 ENCOUNTER — Encounter: Payer: Self-pay | Admitting: Internal Medicine

## 2022-12-25 ENCOUNTER — Ambulatory Visit (INDEPENDENT_AMBULATORY_CARE_PROVIDER_SITE_OTHER): Payer: Medicaid Other | Admitting: Internal Medicine

## 2022-12-25 VITALS — BP 114/80 | HR 84 | Temp 98.3°F | Ht 63.0 in | Wt 129.4 lb

## 2022-12-25 DIAGNOSIS — B351 Tinea unguium: Secondary | ICD-10-CM | POA: Diagnosis not present

## 2022-12-25 DIAGNOSIS — Z419 Encounter for procedure for purposes other than remedying health state, unspecified: Secondary | ICD-10-CM | POA: Diagnosis not present

## 2022-12-25 DIAGNOSIS — K259 Gastric ulcer, unspecified as acute or chronic, without hemorrhage or perforation: Secondary | ICD-10-CM

## 2022-12-25 DIAGNOSIS — R748 Abnormal levels of other serum enzymes: Secondary | ICD-10-CM | POA: Insufficient documentation

## 2022-12-25 DIAGNOSIS — M19071 Primary osteoarthritis, right ankle and foot: Secondary | ICD-10-CM | POA: Insufficient documentation

## 2022-12-25 DIAGNOSIS — Z8261 Family history of arthritis: Secondary | ICD-10-CM

## 2022-12-25 DIAGNOSIS — R7989 Other specified abnormal findings of blood chemistry: Secondary | ICD-10-CM | POA: Diagnosis not present

## 2022-12-25 DIAGNOSIS — R634 Abnormal weight loss: Secondary | ICD-10-CM

## 2022-12-25 DIAGNOSIS — Z87898 Personal history of other specified conditions: Secondary | ICD-10-CM | POA: Insufficient documentation

## 2022-12-25 HISTORY — DX: Other specified abnormal findings of blood chemistry: R79.89

## 2022-12-25 HISTORY — DX: Personal history of other specified conditions: Z87.898

## 2022-12-25 LAB — COMPREHENSIVE METABOLIC PANEL
ALT: 28 U/L (ref 0–35)
AST: 25 U/L (ref 0–37)
Albumin: 4.8 g/dL (ref 3.5–5.2)
Alkaline Phosphatase: 50 U/L (ref 39–117)
BUN: 14 mg/dL (ref 6–23)
CO2: 34 mEq/L — ABNORMAL HIGH (ref 19–32)
Calcium: 10.7 mg/dL — ABNORMAL HIGH (ref 8.4–10.5)
Chloride: 99 mEq/L (ref 96–112)
Creatinine, Ser: 0.65 mg/dL (ref 0.40–1.20)
GFR: 97.31 mL/min (ref >60.00)
Glucose, Bld: 94 mg/dL (ref 70–99)
Potassium: 4.6 mEq/L (ref 3.5–5.1)
Sodium: 140 mEq/L (ref 135–145)
Total Bilirubin: 0.4 mg/dL (ref 0.2–1.2)
Total Protein: 7.4 g/dL (ref 6.0–8.3)

## 2022-12-25 LAB — CORTISOL: Cortisol, Plasma: 9.4 ug/dL

## 2022-12-25 LAB — SEDIMENTATION RATE: Sed Rate: 11 mm/hr (ref 0–30)

## 2022-12-25 LAB — C-REACTIVE PROTEIN: CRP: <1 mg/dL (ref 0.5–20.0)

## 2022-12-25 LAB — URIC ACID: Uric Acid, Serum: 3.8 mg/dL (ref 2.4–7.0)

## 2022-12-25 MED ORDER — CICLOPIROX OLAMINE 0.77 % EX CREA
TOPICAL_CREAM | Freq: Two times a day (BID) | CUTANEOUS | 2 refills | Status: AC
Start: 2022-12-25 — End: ?

## 2022-12-25 MED ORDER — DICLOFENAC SODIUM 1 % EX GEL
4.0000 g | Freq: Four times a day (QID) | CUTANEOUS | 3 refills | Status: AC | PRN
Start: 2022-12-25 — End: ?

## 2022-12-25 MED ORDER — CELECOXIB 200 MG PO CAPS
200.0000 mg | ORAL_CAPSULE | Freq: Two times a day (BID) | ORAL | 3 refills | Status: DC
Start: 2022-12-25 — End: 2023-06-18

## 2022-12-25 NOTE — Progress Notes (Signed)
[facility]: [phone]   Routine Medical Office Visit  Patient:  Amanda White      Age: 57 y.o.       Sex:  female  Date:   12/25/2022 Patient Care Team: Lula Olszewski, MD as PCP - General (Internal Medicine) Jodelle Red, MD as PCP - Cardiology (Cardiology) Fritzi Mandes, MD as Consulting Physician (General Surgery) Autry-Lott, Randa Evens, DO as Resident (Family Medicine) Altamese Kentwood, MD as Consulting Physician (Endocrinology) Sanjuana Kava, NP as Nurse Practitioner (Psychiatry) Today's Healthcare Provider: Lula Olszewski, MD   Assessment and Plan:         [name] was seen today for foot pain.  Arthritis of first metatarsophalangeal (MTP) joint of right foot Assessment & Plan: Foot Pain: Chronic pain in the right metatarsophalangeal joint has been worsening over several months, suggesting possible gout or arthritis, with a family history of gout and pain exacerbated by joint movement. We will order a foot x-ray and uric acid level, refer her to a podiatrist for further evaluation, increase her Celebrex dose, and apply Voltaren topical gel to the joint.  I think its most likely diagnosis is gout given the location. So I give low purine eating plan handout in AVS.   Avoid prednisone due to active peptic ulcer disease   Orders: -     Ambulatory referral to Podiatry -     DG Foot Complete Right; Future -     Diclofenac Sodium; Apply 4 g topically 4 (four) times daily as needed.  Dispense: 100 g; Refill: 3 -     Celecoxib; Take 1 capsule (200 mg total) by mouth 2 (two) times daily.  Dispense: 180 capsule; Refill: 3 -     Uric acid -     Sedimentation rate -     C-reactive protein  Onychomycosis Assessment & Plan: Onychomycosis: She has a fungal infection of the toenails, for which we will prescribe an antifungal cream for topical application.  Orders: -     Ciclopirox Olamine; Apply topically 2 (two) times daily.  Dispense: 90 g;  Refill: 2  LFT elevation Assessment & Plan: Liver Function: She has had previous abnormal liver function tests-cause unclear. We will recheck liver function tests and AFP tumor marker.  Orders: -     Comprehensive metabolic panel -     AFP tumor marker -     Cortisol -     ACTH  Family history of rheumatoid arthritis  Unintentional weight loss Overview: November 28, 2022 interim history:   Denies any heartburn, prior abdomen/flank pain gone, just some right upper chest pain now, gastrointestinal did endoscopy and found ulcer but we don't have report.  Off advil. Weight stable. Continuing remeron  Wt Readings from Last 5 Encounters:  11/28/22 125 lb 6.4 oz (56.9 kg)  11/24/22 125 lb 6.4 oz (56.9 kg)  11/16/22 125 lb 11.2 oz (57 kg)  10/25/22 125 lb 6.4 oz (56.9 kg)  10/09/22 122 lb (55.3 kg)  50 pounds 1 year unintentional weight loss at age 57 is worrisome  Interim history:  Endoscopy consultation planned for later today chronic subcutaneous nodule R neck and right flank seen by ENT who reassured offered biopsy shared decision making decided to hold off due to low risk Still has no pulmonary symptom(s) and CT chest was negative for tumor Patient reports stopping omeprazole and Nexium and has Pepcid complete and not having to use Right upper quadrant of abdomen pain with hemorrhagic liver cysts  on MRI.  This resolved after adding Pepcid complete and she stopped Nexium.  Prior history:  On 09/25/22  weight loss was associated with poorly controlled gastroesophageal reflux disease despite Nexium Remeron trial given and she is taking now and having more dizziness since starting it but weight stabilizing 09/25/22 referred to gastrointestinal and surgery input pending Massive lab workup negative CT of chest/abdomen/pelvis negative 09/2022  Orders: -     Cortisol -     ACTH  High gamma glutamyl transferase (GGT)  History of weight loss Assessment & Plan: Weight Loss: She has  experienced significant weight loss, possibly due to a stress ulcer, but has regained some weight. We will continue monitoring her weight and consider adjusting her Remeron dosage if excessive weight gain occurs.   Stress ulcer of stomach Overview: Diagnosed 09/2022 by Dr. Tobe Sos per patient report  Deep crater ulcer per patient report  She also was taking advils and has stopped  IMPRESSIONS:  Inlet patch in the proximal esophagus The esophagus was otherwise normal Mild gastritis (inflammation) was found in the gastric antrum, biopsied Small ulcer was found in the gastric antrum The duodenal mucosa showed no abnormalities in the entire duodenum; cold forcep second portion RECOMMENDATIONS:  Follow up in GI clinic for pathology results Resume current medications Avoid Non-steroid anti-inflammatory drugs Start: Omeprazole 40mg  Twice a day REPEAT EXAM: Return in 2 months for EGD to check for Ulcer healing  The report was activated by Carney Harder, MD on 58/6/202  INTERPRETATION AND DIAGNOSIS:  A. Duodenum, second portion, biopsy:  Benign duodenal mucosa without significant pathologic abnormality. Negative for celiac disease. B. Gastric antrum, biopsy:  Benign antral mucosa with features of reactive gastropathy (chemical gastritis). Negative for H. pylori (H. pylori immunostain negative). C. Gastric angularis, biopsy:  Benign oxyntic mucosa with minimal chronic inflammation. D. Gastric body, biopsy:  Benign oxyntic mucosa with minimal chronic inflammation. Negative for H. pylori (H. pylori immunostain negative).  Assessment & Plan: Peptic Ulcer Disease: She has a history of peptic ulcer disease and report occasional heartburn despite taking Prilosec twice daily. We will continue Prilosec twice daily and advise the use of Pepcid Complete for breakthrough heartburn.  She will get repeat esophagoduodenoscopy for resolution soon. Breakthrough is not fully controlled at the  moment.     General Health Maintenance: We will continue her current medications including Crestor, carbamazepine, gabapentin, Flexeril, Zofran, and Remeron. Considering adjusting Remeron to every other day to maintain sleep benefits and control weight gain. We will continue Prilosec twice daily for peptic ulcer disease, advise the use of Pepcid Complete for breakthrough heartburn, continue Celebrex for foot pain and increase the dose, apply Voltaren topical gel to the foot for pain, check liver function tests and AFP tumor marker, and refer her to a podiatrist for further evaluation of foot pain.   Recommended follow up: Return in about 1 month (around 01/25/2023) for chronic disease monitoring and management.  Future Appointments  Date Time Provider Department Center  01/09/2023  8:40 AM Altamese , MD [facility] None  01/16/2023  3:00 PM Meta Hatchet, PA [facility] None  01/31/2023  9:20 AM Lula Olszewski, MD [facility]  04/13/2023  9:50 AM [facility] 3 [facility]           Clinical Presentation:    57 y.o. female who has Hyperlipidemia; Benzodiazepine dependence (HCC); GERD (gastroesophageal reflux disease); Lumbago of lumbar region with sciatica; Podagra; Hypertension; Generalized anxiety disorder; Memory impairment; Subcutaneous nodule; RUQ abdominal tenderness;  Cystic disease of liver; Dizziness; Right-sided chest pain; Cyst of gallbladder; Aortic atherosclerosis (HCC); Abnormal cardiac CT angiography; History of colon polyps; DDD (degenerative disc disease), thoracolumbar; Moderate episode of recurrent major depressive disorder (HCC); Long-term current use of benzodiazepine; Stress ulcer of stomach; Fecal retention; High gamma glutamyl transferase (GGT); Arthritis of first metatarsophalangeal (MTP) joint of right foot; Onychomycosis; LFT elevation; Family history of rheumatoid arthritis; and History of weight loss on their problem list. Her reasons/main concerns/chief  complaints for today's office visit are Foot Pain (Both feet, painful to the touch. Has gotten worse, arthritis in feet.)   AI-Extracted: Discussed the use of AI scribe software for clinical note transcription with the patient, who gave verbal consent to proceed.  History of Present Illness   The patient, a 57 year old with a family history of gout and rheumatoid arthritis, presents with worsening foot pain over several months. The pain is localized to the right metatarsophalangeal joint and has been gradually intensifying. The patient reports a similar pain in the left foot, although less severe. The pain is described as sharp, exacerbated by movement of the joint, and is more pronounced when she is not wearing shoes.  Previously, the patient sought care from an orthopedic specialist who identified early signs of arthritis in the right foot and recommended shoe inserts and a gel toe separator. These interventions provided temporary relief but have become less effective over time. The patient also reports a new onset of left ankle pain, which she attributes to increased walking.  In addition to the foot pain, the patient has been dealing with a persistent toenail fungal infection. She reports a history of wearing shoes that did not provide adequate ventilation, which may have contributed to the condition.  The patient has a history of significant weight loss, dropping from 178 pounds to 118 pounds, attributed to a stress ulcer. Recently, she has regained some weight, currently weighing 129 pounds. The patient is currently taking Celebrex for pain management, which has not been effective for the foot pain.  The patient also reports a history of liver injury and is currently awaiting a repeat endoscopy to assess the status of the ulcer. She is on multiple medications, including Tegretol, Gabapentin, Flexeril, Zofran, Remeron, and Prilosec.        Reviewed chart data: Past Medical History:   Diagnosis Date   Abnormal cardiac CT angiography 10/12/2022   Small intermediate density pericardial effusion versus thickening. Consider further evaluation with echocardiography.   Anxiety    Bipolar 1 disorder (HCC)    Carpal tunnel syndrome 09/25/2022   R sided release 2022, all better now.   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  Off advil. Weight stable. Continuing remeron  Wt Readings from Last 5 Encounters:  11/28/22  125 lb 6.4 oz (56.9 kg)  11/24/22  125 lb 6.4 oz (56.9 kg)  11/16/22  125 lb 11.2 oz (57 kg)  10/25/22     Outpatient Medications Prior to Visit  Medication Sig   acetaminophen (TYLENOL) 325 MG tablet Take 650 mg by mouth every 6 (six) hours as needed.   carbamazepine (TEGRETOL) 100 MG chewable tablet Chew 1 tablet (100 mg total) by mouth 2 (two) times daily.   clonazePAM (KLONOPIN) 2 MG tablet Take 1 tablet (2 mg total) by mouth at bedtime.   cyclobenzaprine (FLEXERIL) 10 MG tablet Take 1 tablet (10 mg total) by mouth daily as needed for muscle spasms.   famotidine-calcium carbonate-magnesium hydroxide (PEPCID COMPLETE) 10-800-165 MG chewable tablet Chew 1 tablet by mouth daily as needed.   gabapentin (NEURONTIN) 300 MG capsule Take 1 capsule (300 mg  total) by mouth 3 (three) times daily.   lisinopril-hydrochlorothiazide (PRINZIDE,ZESTORETIC) 10-12.5 MG tablet Take 1 tablet by mouth daily.   mirtazapine (REMERON) 7.5 MG tablet TAKE 1 TABLET BY MOUTH AT BEDTIME.   omeprazole (PRILOSEC) 20 MG capsule Take 20 mg by mouth 2 (two) times daily before a meal.   ondansetron (ZOFRAN ODT) 4 MG disintegrating tablet Take 1 tablet (4 mg total) by mouth every 8 (eight) hours as needed for nausea or vomiting.   rosuvastatin (CRESTOR) 10 MG tablet Take 1 tablet (10 mg total) by mouth daily for 30 days, THEN 2 tablets (20 mg total) daily.   [DISCONTINUED] celecoxib (CELEBREX) 100 MG capsule Take 1 capsule (100 mg total) by mouth 2 (two) times daily.   No facility-administered medications prior to visit.         Clinical Data Analysis:   Physical Exam  BP 114/80 (BP Location: Left Arm, Patient Position: Sitting)   Pulse 84   Temp 98.3 F (36.8 C) (Temporal)   Ht 5\' 3"  (1.6 m)   Wt 129 lb 6.4 oz (58.7 kg)   LMP 12/20/2011   SpO2 100%   BMI 22.92 kg/m  Wt Readings from Last 10 Encounters:  12/25/22 129 lb 6.4 oz (58.7 kg)  12/05/22 128 lb 1.6 oz (58.1 kg)  11/28/22 125 lb 6.4 oz (56.9 kg)  11/24/22 125 lb 6.4 oz (56.9 kg)  11/16/22 125 lb 11.2 oz (57 kg)  10/25/22 125 lb 6.4 oz (56.9 kg)  10/09/22 122 lb (55.3 kg)  09/25/22 129 lb 3.2 oz (58.6 kg)  04/18/21 175 lb 11.3 oz (79.7 kg)  05/19/18 173 lb (78.5 kg)   Vital signs reviewed.  Nursing notes reviewed. Weight trend reviewed. Abnormalities and Problem-Specific physical exam findings:  foot exam normal, good pulses, but right 1st metatarsophalangeal joint hurts with movement of right great toeGeneral Appearance:  No acute distress appreciable.   Well-groomed, healthy-appearing female.  Well proportioned with no abnormal fat distribution.  Good muscle tone. Skin: Clear and well-hydrated. Pulmonary:  Normal work of breathing at rest, no respiratory distress apparent. SpO2: 100 %   Musculoskeletal: All extremities are intact.  Neurological:  Awake, alert, oriented, and engaged.  No obvious focal neurological deficits or cognitive impairments.  Sensorium seems unclouded.   Speech is clear and coherent with logical content. Psychiatric:  Appropriate mood, pleasant and cooperative demeanor, thoughtful and engaged during the exam  Results Reviewed:    Results for orders placed or performed in visit on 12/25/22  Uric acid  Result Value Ref Range   Uric Acid, Serum 3.8 2.4 -  7.0 mg/dL  Sedimentation rate  Result Value Ref Range   Sed Rate 11 0 - 30 mm/hr  C-reactive protein  Result Value Ref Range   CRP <1.0 0.5 - 20.0 mg/dL  Comp Met (CMET)  Result Value Ref Range   Sodium 140 135 - 145 mEq/L   Potassium 4.6 3.5 - 5.1 mEq/L   Chloride 99 96 - 112 mEq/L   CO2 34 (H) 19 - 32 mEq/L   Glucose, Bld 94 70 - 99 mg/dL   BUN 14 6 - 23 mg/dL   Creatinine, Ser 2.13 0.40 - 1.20 mg/dL   Total Bilirubin 0.4 0.2 - 1.2 mg/dL   Alkaline Phosphatase 50 39 - 117 U/L   AST 25 0 - 37 U/L   ALT 28 0 - 35 U/L   Total Protein 7.4 6.0 - 8.3 g/dL   Albumin 4.8 3.5 - 5.2 g/dL   GFR 08.65 >78.46 mL/min   Calcium 10.7 (H) 8.4 - 10.5 mg/dL  Cortisol  Result Value Ref Range   Cortisol, Plasma 9.4 ug/dL    Office Visit on 96/29/5284  Component Date Value   Uric Acid, Serum 12/25/2022 3.8    Sed Rate 12/25/2022 11    CRP 12/25/2022 <1.0    Sodium 12/25/2022 140    Potassium 12/25/2022 4.6    Chloride 12/25/2022 99    CO2 12/25/2022 34 (H)    Glucose, Bld 12/25/2022 94    BUN 12/25/2022 14    Creatinine, Ser 12/25/2022 0.65    Total Bilirubin 12/25/2022 0.4    Alkaline Phosphatase 12/25/2022 50    AST 12/25/2022 25    ALT 12/25/2022 28    Total Protein 12/25/2022 7.4    Albumin 12/25/2022 4.8    GFR 12/25/2022 97.31    Calcium 12/25/2022 10.7 (H)    Cortisol, Plasma 12/25/2022 9.4   Office Visit on 11/28/2022  Component Date Value   H pylori Ag, Stl 11/30/2022 Negative    Lab on 11/21/2022  Component Date Value   Sodium 11/21/2022 136    Potassium 11/21/2022 4.1    Chloride 11/21/2022 97    CO2 11/21/2022 30    Glucose, Bld 11/21/2022 93    BUN 11/21/2022 15    Creatinine, Ser 11/21/2022 0.72    GFR 11/21/2022 92.47    Calcium 11/21/2022 9.9    TSH 11/21/2022 1.31    Free T4 11/21/2022 0.76   Office Visit on 11/16/2022  Component Date Value   High risk HPV 11/16/2022 Negative    Adequacy 11/16/2022 Satisfactory for evaluation; transformation zone component PRESENT.    Diagnosis 11/16/2022 - Negative for intraepithelial lesion or malignancy (NILM)    Comment 11/16/2022 Atrophic changes are present.    Comment 11/16/2022 Normal Reference Range HPV - Negative    Glucose, UA 11/16/2022 NEGATIVE    Bilirubin Urine 11/16/2022 NEGATIVE    Ketones, ur 11/16/2022 NEGATIVE    Specific Gravity, Urine 11/16/2022 1.010    Hgb urine dipstick 11/16/2022 NEGATIVE    pH 11/16/2022 7.0    Protein, ur 11/16/2022 NEGATIVE    Urobilinogen, UA 11/16/2022 0.2    Nitrite 11/16/2022 NEGATIVE    Leukocytes,Ua 11/16/2022 NEGATIVE   Abstract on 11/08/2022  Component Date Value   [facility] Colonoscopy 05/10/2021 See Report (in chart)   Scanned Document on 10/11/2022  Component Date Value   [facility] Colonoscopy 05/25/2021 See Report (in chart)   Office Visit on 09/25/2022  Component Date Value   Hepatitis C Ab 09/25/2022 NON-REACTIVE  HIV 1&2 Ab, 4th Generati* 09/25/2022 NON-REACTIVE    Cholesterol 09/25/2022 221 (H)    Triglycerides 09/25/2022 102.0    HDL 09/25/2022 76.50    VLDL 09/25/2022 20.4    LDL Cholesterol 09/25/2022 124 (H)    Total CHOL/HDL Ratio 09/25/2022 3    NonHDL 09/25/2022 144.80    WBC 09/25/2022 7.2    RBC 09/25/2022 3.96    Platelets 09/25/2022 423.0 (H)    Hemoglobin 09/25/2022 13.0    HCT 09/25/2022 37.1    MCV 09/25/2022 93.8    MCHC 09/25/2022 34.9    RDW 09/25/2022 13.0    Sodium 09/25/2022 138    Potassium 09/25/2022 3.6    Chloride  09/25/2022 98    CO2 09/25/2022 30    Glucose, Bld 09/25/2022 96    BUN 09/25/2022 17    Creatinine, Ser 09/25/2022 0.64    Total Bilirubin 09/25/2022 0.7    Alkaline Phosphatase 09/25/2022 47    AST 09/25/2022 40 (H)    ALT 09/25/2022 99 (H)    Total Protein 09/25/2022 7.2    Albumin 09/25/2022 4.9    GFR 09/25/2022 97.85    Calcium 09/25/2022 10.1    Cyclic Citrullin Peptide* 09/25/2022 <16    Rheumatoid fact SerPl-aC* 09/25/2022 <14    Anti Nuclear Antibody (A* 09/25/2022 NEGATIVE    VITD 09/25/2022 26.68 (L)    Vitamin B-12 09/25/2022 306    Folate 09/25/2022 11.2    ANCA SCREEN 09/25/2022 Negative    T3 Uptake 09/25/2022 33    T4, Total 09/25/2022 7.7    Free Thyroxine Index 09/25/2022 2.5    TSH 09/25/2022 0.63    No image results found.   DG Lumbar Spine Complete  Result Date: 12/08/2022 CLINICAL DATA:  Pain EXAM: LUMBAR SPINE - COMPLETE 4+ VIEW COMPARISON:  None Available. FINDINGS: 7 mm anterolisthesis of L5 versus S1. No other malalignment. Lower lumbar facet degenerative changes. Moderate fecal loading in the colon. No other abnormalities. IMPRESSION: 1. 7 mm anterolisthesis of L5 versus S1. Lower lumbar facet degenerative changes. 2. Moderate fecal loading in the colon. Electronically Signed   By: Gerome Sam III M.D.   On: 12/08/2022 08:52   CT Abdomen Pelvis W Contrast  Result Date: 10/12/2022 CLINICAL DATA:  Right-sided chest pain with positive D-dimer levels. Pulmonary embolism suspected, low to intermediate probability. Unintentional weight loss with bruising and vaginal spotting. Evaluate for malignancy. EXAM: CT ANGIOGRAPHY CHEST CT ABDOMEN AND PELVIS WITH CONTRAST TECHNIQUE: Multidetector CT imaging of the chest was performed using the standard protocol during bolus administration of intravenous contrast. Multiplanar CT image reconstructions and MIPs were obtained to evaluate the vascular anatomy. Multidetector CT imaging of the abdomen and pelvis was  performed using the standard protocol during bolus administration of intravenous contrast. RADIATION DOSE REDUCTION: This exam was performed according to the departmental dose-optimization program which includes automated exposure control, adjustment of the mA and/or kV according to patient size and/or use of iterative reconstruction technique. CONTRAST:  75mL OMNIPAQUE IOHEXOL 350 MG/ML SOLN COMPARISON:  Chest radiographs 05/19/2018. Abdominal CT 07/13/2011, abdominal MRI 07/24/2022 and pelvic ultrasound 10/10/2022. FINDINGS: CTA CHEST FINDINGS Cardiovascular: The pulmonary arteries are well opacified with contrast to the level of the subsegmental branches. There is no evidence of acute pulmonary embolism. No acute systemic arterial abnormalities are identified. There is minimal aortic atherosclerosis. 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 There is a small amount of intermediate see pericardial fluid versus thickening. The heart size is normal. Mediastinum/Nodes: There  are no enlarged mediastinal, hilar or axillary lymph nodes. The thyroid gland, trachea and esophagus demonstrate no significant findings. Lungs/Pleura: No pleural effusion or pneumothorax. The lungs are clear. Musculoskeletal/Chest wall: No chest wall mass or suspicious osseous findings. Mild thoracic spondylosis. CT ABDOMEN AND PELVIS FINDINGS Hepatobiliary: The liver is normal in density without suspicious focal abnormality. Multiple hepatic cysts are again demonstrated. Some of these have enlarged from the previous examinations, although the largest remaining cyst in the left hepatic lobe has decreased in size from the remote CT, currently measuring 2.4 cm on image 9/7 (previously 4.8 cm). Current size is similar to recent MRI. No evidence of gallstones, gallbladder wall thickening or biliary dilatation. Pancreas: Unremarkable. No pancreatic ductal dilatation or surrounding inflammatory changes. Spleen: Normal in size  without focal abnormality. Adrenals/Urinary Tract: Both adrenal glands appear normal. No evidence of urinary tract calculus, suspicious renal lesion or hydronephrosis. The bladder appears normal for its degree of distention. Stomach/Bowel: No enteric contrast administered. The stomach appears unremarkable for its degree of distension. No evidence of bowel wall thickening, distention or surrounding inflammatory change. The appendix appears normal. Mildly prominent stool in the proximal colon. Vascular/Lymphatic: There are no enlarged abdominal or pelvic lymph nodes. Mild aortoiliac atherosclerosis. No acute vascular findings. Reproductive: The uterus and ovaries appear unremarkable. No adnexal mass. Other: No evidence of abdominal wall mass or hernia. No ascites. Musculoskeletal: No acute or significant osseous findings. Lower lumbar spondylosis with disc space narrowing, endplate osteophytes and facet hypertrophy. Review of the MIP images confirms the above findings. IMPRESSION: 1. No evidence of acute pulmonary embolism or other acute chest findings. 2. No evidence of malignancy within the chest, abdomen or pelvis. 3. Small intermediate density pericardial effusion versus thickening. Consider further evaluation with echocardiography. 4. Aberrant right subclavian artery. 5. Mild lower lumbar spondylosis. 6.  Aortic Atherosclerosis (ICD10-I70.0). Electronically Signed   By: Carey Bullocks M.D.   On: 10/12/2022 14:30   CT Angio Chest Pulmonary Embolism (PE) W or WO Contrast  Result Date: 10/12/2022 CLINICAL DATA:  Right-sided chest pain with positive D-dimer levels. Pulmonary embolism suspected, low to intermediate probability. Unintentional weight loss with bruising and vaginal spotting. Evaluate for malignancy. EXAM: CT ANGIOGRAPHY CHEST CT ABDOMEN AND PELVIS WITH CONTRAST TECHNIQUE: Multidetector CT imaging of the chest was performed using the standard protocol during bolus administration of intravenous  contrast. Multiplanar CT image reconstructions and MIPs were obtained to evaluate the vascular anatomy. Multidetector CT imaging of the abdomen and pelvis was performed using the standard protocol during bolus administration of intravenous contrast. RADIATION DOSE REDUCTION: This exam was performed according to the departmental dose-optimization program which includes automated exposure control, adjustment of the mA and/or kV according to patient size and/or use of iterative reconstruction technique. CONTRAST:  75mL OMNIPAQUE IOHEXOL 350 MG/ML SOLN COMPARISON:  Chest radiographs 05/19/2018. Abdominal CT 07/13/2011, abdominal MRI 07/24/2022 and pelvic ultrasound 10/10/2022. FINDINGS: CTA CHEST FINDINGS Cardiovascular: The pulmonary arteries are well opacified with contrast to the level of the subsegmental branches. There is no evidence of acute pulmonary embolism. No acute systemic arterial abnormalities are identified. There is minimal aortic atherosclerosis. 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 There is a small amount of intermediate see pericardial fluid versus thickening. The heart size is normal. Mediastinum/Nodes: There are no enlarged mediastinal, hilar or axillary lymph nodes. The thyroid gland, trachea and esophagus demonstrate no significant findings. Lungs/Pleura: No pleural effusion or pneumothorax. The lungs are clear. Musculoskeletal/Chest wall: No chest wall mass or suspicious osseous  findings. Mild thoracic spondylosis. CT ABDOMEN AND PELVIS FINDINGS Hepatobiliary: The liver is normal in density without suspicious focal abnormality. Multiple hepatic cysts are again demonstrated. Some of these have enlarged from the previous examinations, although the largest remaining cyst in the left hepatic lobe has decreased in size from the remote CT, currently measuring 2.4 cm on image 9/7 (previously 4.8 cm). Current size is similar to recent MRI. No evidence of gallstones, gallbladder  wall thickening or biliary dilatation. Pancreas: Unremarkable. No pancreatic ductal dilatation or surrounding inflammatory changes. Spleen: Normal in size without focal abnormality. Adrenals/Urinary Tract: Both adrenal glands appear normal. No evidence of urinary tract calculus, suspicious renal lesion or hydronephrosis. The bladder appears normal for its degree of distention. Stomach/Bowel: No enteric contrast administered. The stomach appears unremarkable for its degree of distension. No evidence of bowel wall thickening, distention or surrounding inflammatory change. The appendix appears normal. Mildly prominent stool in the proximal colon. Vascular/Lymphatic: There are no enlarged abdominal or pelvic lymph nodes. Mild aortoiliac atherosclerosis. No acute vascular findings. Reproductive: The uterus and ovaries appear unremarkable. No adnexal mass. Other: No evidence of abdominal wall mass or hernia. No ascites. Musculoskeletal: No acute or significant osseous findings. Lower lumbar spondylosis with disc space narrowing, endplate osteophytes and facet hypertrophy. Review of the MIP images confirms the above findings. IMPRESSION: 1. No evidence of acute pulmonary embolism or other acute chest findings. 2. No evidence of malignancy within the chest, abdomen or pelvis. 3. Small intermediate density pericardial effusion versus thickening. Consider further evaluation with echocardiography. 4. Aberrant right subclavian artery. 5. Mild lower lumbar spondylosis. 6.  Aortic Atherosclerosis (ICD10-I70.0). Electronically Signed   By: Carey Bullocks M.D.   On: 10/12/2022 14:30   US PELVIC COMPLETE WITH TRANSVAGINAL  Result Date: 10/10/2022 CLINICAL DATA:  Weight loss, postmenopausal vaginal bleeding EXAM: TRANSABDOMINAL AND TRANSVAGINAL ULTRASOUND OF PELVIS TECHNIQUE: Both transabdominal and transvaginal ultrasound examinations of the pelvis were performed. Transabdominal technique was performed for global imaging of the  pelvis including uterus, ovaries, adnexal regions, and pelvic cul-de-sac. It was necessary to proceed with endovaginal exam following the transabdominal exam to visualize the endometrium and ovaries bilaterally. COMPARISON:  08/10/2011 FINDINGS: Uterus Measurements: 4.7 x 1.9 x 4.0 cm = volume: 18 mL. No fibroids or other mass visualized. Endometrium Thickness: 2 mm.  No focal abnormality visualized. Right ovary Measurements: 1.8 x 1.7 x 1.3 cm = volume: 2 mL. Normal appearance/no adnexal mass. Left ovary Measurements: 1.5 x 0.9 x 1.3 cm = volume: 1 mL. Normal appearance/no adnexal mass. Other findings No abnormal free fluid. IMPRESSION: 1. Normal pelvic sonogram. Electronically Signed   By: Helyn Numbers M.D.   On: 10/10/2022 17:31       This encounter employed real-time, collaborative documentation. The patient actively reviewed and updated their medical record on a shared screen, ensuring transparency and facilitating joint problem-solving for the problem list, overview, and plan. This approach promotes accurate, informed care. The treatment plan was discussed and reviewed in detail, including medication safety, potential side effects, and all patient questions. We confirmed understanding and comfort with the plan. Follow-up instructions were established, including contacting the office for any concerns, returning if symptoms worsen, persist, or new symptoms develop, and precautions for potential emergency department visits. ----------------------------------------------------- Lula Olszewski, MD  12/25/2022 7:21 PM  [facility]:  [phone]

## 2022-12-25 NOTE — Assessment & Plan Note (Signed)
Liver Function: She has had previous abnormal liver function tests-cause unclear. We will recheck liver function tests and AFP tumor marker.

## 2022-12-25 NOTE — Assessment & Plan Note (Signed)
Peptic Ulcer Disease: She has a history of peptic ulcer disease and report occasional heartburn despite taking Prilosec twice daily. We will continue Prilosec twice daily and advise the use of Pepcid Complete for breakthrough heartburn.  She will get repeat esophagoduodenoscopy for resolution soon. Breakthrough is not fully controlled at the moment.

## 2022-12-25 NOTE — Assessment & Plan Note (Addendum)
Foot Pain: Chronic pain in the right metatarsophalangeal joint has been worsening over several months, suggesting possible gout or arthritis, with a family history of gout and pain exacerbated by joint movement. We will order a foot x-ray and uric acid level, refer her to a podiatrist for further evaluation, increase her Celebrex dose, and apply Voltaren topical gel to the joint.  I think its most likely diagnosis is gout given the location. So I give low purine eating plan handout in AVS.   Avoid prednisone due to active peptic ulcer disease

## 2022-12-25 NOTE — Patient Instructions (Addendum)
It was a pleasure seeing you today! Your health and satisfaction are our top priorities.  Glenetta Hew, MD  Your Providers PCP: Lula Olszewski, MD,  [phone]) Referring Provider: Lula Olszewski, MD,  [phone]) Care Team Provider: Fritzi Mandes, MD,  [phone]) Care Team Provider: Lavonda Jumbo, [place],  [phone]) Care Team Provider: Altamese [place], [place],  [phone]) Care Team Provider: Sanjuana Kava, NP,  [phone]) Care Team Provider: Jodelle Red, MD,  [phone])  VISIT SUMMARY:  During your visit, we discussed your ongoing foot pain, toenail fungal infection, weight loss, peptic ulcer disease, and liver function. We suspect your foot pain may be due to gout or arthritis, given your family history and the nature of your symptoms. Your toenail condition is a fungal infection, and your weight loss may be related to a stress ulcer. We will continue to monitor your peptic ulcer disease and liver function.  YOUR PLAN:  -FOOT PAIN: Your foot pain may be due to gout or arthritis. We will increase your Celebrex dose, apply Voltaren gel to the joint, and refer you to a podiatrist for further evaluation. We will also order a foot x-ray and uric acid level test to help with the diagnosis.  -TOENAIL FUNGAL INFECTION: You have a fungal infection in your toenails. We will prescribe an antifungal cream for you to apply topically.  -WEIGHT LOSS: Your significant weight loss may be due to a stress ulcer. We will continue to monitor your weight and may adjust your Remeron dosage if excessive weight gain occurs.  -PEPTIC ULCER DISEASE: You have a history of peptic ulcer disease. We will continue your Prilosec medication twice daily and recommend Pepcid Complete for any breakthrough heartburn.  -LIVER FUNCTION: You have had previous abnormal liver function tests. We will recheck your liver function tests and AFP tumor marker.  INSTRUCTIONS:  Please continue  your current medications including Crestor, carbamazepine, gabapentin, Flexeril, Zofran, and Remeron. We may adjust your Remeron dosage to every other day to maintain sleep benefits and control weight gain. Continue taking Prilosec twice daily for peptic ulcer disease and use Pepcid Complete for breakthrough heartburn. Apply the prescribed Voltaren topical gel to your foot for pain and use the antifungal cream for your toenail infection. We will also refer you to a podiatrist for further evaluation of your foot pain.   NEXT STEPS: [x]  Early Intervention: Schedule sooner appointment, call our on-call services, or go to emergency room if there is any significant Increase in pain or discomfort New or worsening symptoms Sudden or severe changes in your health [x]  Flexible Follow-Up: We recommend a No follow-ups on file. for optimal routine care. This allows for progress monitoring and treatment adjustments. [x]  Preventive Care: Schedule your annual preventive care visit! It's typically covered by insurance and helps identify potential health issues early. [x]  Lab & X-ray Appointments: Incomplete tests scheduled today, or call to schedule. X-rays: [facility] (M-F, 8:30am-noon or 1pm-5pm). [x]  Medical Information Release: Sign a release form at front desk to obtain relevant medical information we don't have.  MAKING THE MOST OF OUR FOCUSED 20 MINUTE APPOINTMENTS: [x]   Clearly state your top concerns at the beginning of the visit to focus our discussion [x]   If you anticipate you will need more time, please inform the front desk during scheduling - we can book multiple appointments in the same week. [x]   If you have transportation problems- use our convenient video appointments or ask about transportation support. [x]   We can  get down to business faster if you use MyChart to update information before the visit and submit non-urgent questions before your visit. Thank you for taking the  time to provide details through MyChart.  Let our nurse know and she can import this information into your encounter documents.  Arrival and Wait Times: [x]   Arriving on time ensures that everyone receives prompt attention. [x]   Early morning (8a) and afternoon (1p) appointments tend to have shortest wait times. [x]   Unfortunately, we cannot delay appointments for late arrivals or hold slots during phone calls.  Getting Answers and Following Up [x]   Simple Questions & Concerns: For quick questions or basic follow-up after your visit, reach [place] at [phone] or MyChart messaging. [x]   Complex Concerns: If your concern is more complex, scheduling an appointment might be best. Discuss this with the staff to find the most suitable option. [x]   Lab & Imaging Results: We'll contact you directly if results are abnormal or you don't use MyChart. Most normal results will be on MyChart within 2-3 business days, with a review message from Dr. Jon Billings. Haven't heard back in 2 weeks? Need results sooner? Contact us at [phone]. [x]   Referrals: Our referral coordinator will manage specialist referrals. The specialist's office should contact you within 2 weeks to schedule an appointment. Call us if you haven't heard from them after 2 weeks.  Staying Connected [x]   MyChart: Activate your MyChart for the fastest way to access results and message [place]. See the last page of this paperwork for instructions on how to activate.  Bring to Your Next Appointment [x]   Medications: Please bring all your medication bottles to your next appointment to ensure we have an accurate record of your prescriptions. [x]   Health Diaries: If you're monitoring any health conditions at home, keeping a diary of your readings can be very helpful for discussions at your next appointment.  Billing [x]   X-ray & Lab Orders: These are billed by separate companies. Contact the invoicing company directly for questions or concerns. [x]    Visit Charges: Discuss any billing inquiries with our administrative services team.  Your Satisfaction Matters [x]   Share Your Experience: We strive for your satisfaction! If you have any complaints, or preferably compliments, please let Dr. Jon Billings know directly or contact our Practice Administrators, Edwena Felty or [employer], by asking at the front desk.   Reviewing Your Records [x]   Review this early draft of your clinical encounter notes below and the final encounter summary tomorrow on MyChart after its been completed.  All orders placed so far are visible here: Arthritis of first metatarsophalangeal (MTP) joint of right foot -     Ambulatory referral to Podiatry -     DG Foot Complete Right; Future -     Diclofenac Sodium; Apply 4 g topically 4 (four) times daily as needed.  Dispense: 100 g; Refill: 3 -     Celecoxib; Take 1 capsule (200 mg total) by mouth 2 (two) times daily.  Dispense: 180 capsule; Refill: 3 -     Uric acid -     Sedimentation rate -     C-reactive protein  Onychomycosis -     Ciclopirox Olamine; Apply topically 2 (two) times daily.  Dispense: 90 g; Refill: 2  LFT elevation -     Comprehensive metabolic panel -     AFP tumor marker -     Cortisol -     ACTH  Family history of rheumatoid arthritis  Unintentional weight loss -     Cortisol -     ACTH

## 2022-12-25 NOTE — Assessment & Plan Note (Signed)
Onychomycosis: She has a fungal infection of the toenails, for which we will prescribe an antifungal cream for topical application.

## 2022-12-25 NOTE — Assessment & Plan Note (Signed)
Weight Loss: She has experienced significant weight loss, possibly due to a stress ulcer, but has regained some weight. We will continue monitoring her weight and consider adjusting her Remeron dosage if excessive weight gain occurs.

## 2022-12-26 ENCOUNTER — Ambulatory Visit (INDEPENDENT_AMBULATORY_CARE_PROVIDER_SITE_OTHER)
Admission: RE | Admit: 2022-12-26 | Discharge: 2022-12-26 | Disposition: A | Discharge status: Home / Self Care | Payer: Medicaid Other | Source: Ambulatory Visit | Attending: Internal Medicine | Admitting: Internal Medicine

## 2022-12-26 DIAGNOSIS — M2011 Hallux valgus (acquired), right foot: Secondary | ICD-10-CM | POA: Diagnosis not present

## 2022-12-26 DIAGNOSIS — M25571 Pain in right ankle and joints of right foot: Secondary | ICD-10-CM | POA: Diagnosis not present

## 2022-12-26 DIAGNOSIS — M19071 Primary osteoarthritis, right ankle and foot: Secondary | ICD-10-CM

## 2022-12-27 LAB — AFP TUMOR MARKER: AFP-Tumor Marker: 9.6 ng/mL — ABNORMAL HIGH

## 2022-12-28 NOTE — Progress Notes (Signed)
Reassurance Regarding Recent Blood Test We understand you may have questions following your recent blood test. This notification is to provide some reassurance based on the results.  AFP Monitoring: An AFP test was performed to assess your risk of liver cancer. While elevated AFP can be associated with certain cancers, it can also occur in benign liver conditions. Reassuring Factors: Your MRI showed benign liver cysts. There were no signs of cirrhosis or fatty liver disease. Your liver function tests (LFTs) have returned to normal. These factors all suggest the elevated AFP is likely not due to a serious condition. Next Steps: Given the above, further AFP monitoring is not recommended. As your PCP, it is my medical opinion that no further follow-up is needed specifically for the liver cysts or the elevated tumor marker, and that your overall health outlook remains positive. We recommend continuing with your regular health checkups and managing any existing health conditions as planned.  Please don't hesitate to contact us  if you have any questions or concerns.

## 2022-12-30 LAB — ACTH: C206 ACTH: 14 pg/mL (ref 6–50)

## 2022-12-31 NOTE — Progress Notes (Signed)
Dear Amanda White,  Thank you for getting your X-ray. Based on the results, I'd like to provide you with the following information:  Main finding: The X-ray shows severe osteoarthritis in your big toe joint (great toe metatarsophalangeal joint). This means there's significant wear and tear, resulting in bone-on-bone contact.  Additional findings: Mild hallux valgus (slight inward turning of the big toe) Mild osteoarthritis in the joint at the base of your big toe (first tarsometatarsal joint)  Interpretation: While I initially suspected gout, these X-ray findings strongly suggest that osteoarthritis is the primary cause of your toe joint pain and inflammation.  Treatment options: Pain medications Topical creams Possible lifestyle modifications   Next steps: I recommend seeing a podiatrist for a comprehensive treatment plan tailored to your specific needs.  If you have any questions or concerns, please don't hesitate to reach out. Amanda Olszewski, MD  12/31/2022 8:49 AM   [facility]:  [phone]

## 2023-01-01 ENCOUNTER — Other Ambulatory Visit: Payer: Self-pay | Admitting: Podiatry

## 2023-01-01 ENCOUNTER — Ambulatory Visit (INDEPENDENT_AMBULATORY_CARE_PROVIDER_SITE_OTHER): Payer: Medicaid Other

## 2023-01-01 ENCOUNTER — Encounter: Payer: Self-pay | Admitting: Podiatry

## 2023-01-01 ENCOUNTER — Ambulatory Visit (INDEPENDENT_AMBULATORY_CARE_PROVIDER_SITE_OTHER): Payer: Medicaid Other | Admitting: Podiatry

## 2023-01-01 DIAGNOSIS — M21611 Bunion of right foot: Secondary | ICD-10-CM | POA: Diagnosis not present

## 2023-01-01 DIAGNOSIS — M21612 Bunion of left foot: Secondary | ICD-10-CM

## 2023-01-01 DIAGNOSIS — M7751 Other enthesopathy of right foot: Secondary | ICD-10-CM | POA: Diagnosis not present

## 2023-01-01 DIAGNOSIS — M205X1 Other deformities of toe(s) (acquired), right foot: Secondary | ICD-10-CM

## 2023-01-01 DIAGNOSIS — M659 Synovitis and tenosynovitis, unspecified: Secondary | ICD-10-CM | POA: Diagnosis not present

## 2023-01-01 MED ORDER — TRIAMCINOLONE ACETONIDE 10 MG/ML IJ SUSP
10.00 mg | Freq: Once | INTRAMUSCULAR | Status: AC
Start: 2023-01-01 — End: 2023-01-01
  Administered 2023-01-01: 10 mg

## 2023-01-01 NOTE — Progress Notes (Signed)
Subjective:   Patient ID: Amanda White, female   DOB: 57 y.o.   MRN: [id number]   HPI Patient presents with painful bunion deformity right over left foot stating that it hurts when she moves the joint she needs to be active is lost a lot of weight and is being treated for an active ulcer and is due for endoscopy on Monday.  Patient does not smoke would like to be active   Review of Systems  All other systems reviewed and are negative.       Objective:  Physical Exam Vitals and nursing note reviewed.  Constitutional:      Appearance: She is well-developed.  Pulmonary:     Effort: Pulmonary effort is normal.  Musculoskeletal:        General: Normal range of motion.  Skin:    General: Skin is warm.  Neurological:     Mental Status: She is alert.     Neurovascular status found to be intact muscle strength found to be adequate range of motion adequate with exquisite discomfort in the first MPJ right swelling around the joint mild around the left but not to the same degree with patient found to have good digital perfusion well-oriented x 3     Assessment:  Hallux limitus deformity right with acute inflammation moderate bunion deformity left     Plan:  H&P both conditions reviewed and for the right I did discuss the probability at 1 point for biplanar osteotomy and the possibility someday for fusion or joint implantation.  She cannot do this now and I want to know that her other problems are under control so I went ahead today I did sterile prep I did periarticular injection around the first MPJ 3 mg Kenalog Marcaine mixture tolerated well and will be seen back to recheck  X-rays indicate bone spurs around the first MPJ right narrowing of the joint surface with moderate elevation of the first metatarsal segment consistent with hallux limitus deformity

## 2023-01-02 ENCOUNTER — Encounter: Payer: Self-pay | Admitting: Internal Medicine

## 2023-01-02 DIAGNOSIS — M205X1 Other deformities of toe(s) (acquired), right foot: Secondary | ICD-10-CM | POA: Insufficient documentation

## 2023-01-05 ENCOUNTER — Ambulatory Visit: Payer: Medicaid Other

## 2023-01-08 DIAGNOSIS — Z1211 Encounter for screening for malignant neoplasm of colon: Secondary | ICD-10-CM | POA: Diagnosis not present

## 2023-01-08 DIAGNOSIS — K259 Gastric ulcer, unspecified as acute or chronic, without hemorrhage or perforation: Secondary | ICD-10-CM | POA: Diagnosis not present

## 2023-01-09 ENCOUNTER — Ambulatory Visit: Payer: Medicaid Other | Admitting: "Endocrinology

## 2023-01-11 ENCOUNTER — Ambulatory Visit: Payer: Medicaid Other | Attending: Internal Medicine

## 2023-01-11 ENCOUNTER — Other Ambulatory Visit: Payer: Self-pay

## 2023-01-11 DIAGNOSIS — M5459 Other low back pain: Secondary | ICD-10-CM | POA: Insufficient documentation

## 2023-01-11 DIAGNOSIS — M6281 Muscle weakness (generalized): Secondary | ICD-10-CM | POA: Insufficient documentation

## 2023-01-11 DIAGNOSIS — M545 Low back pain, unspecified: Secondary | ICD-10-CM | POA: Diagnosis not present

## 2023-01-11 DIAGNOSIS — M1990 Unspecified osteoarthritis, unspecified site: Secondary | ICD-10-CM | POA: Insufficient documentation

## 2023-01-11 NOTE — Therapy (Signed)
OUTPATIENT PHYSICAL THERAPY NEURO EVALUATION   Patient Name: Amanda White MRN: [id number] DOB:[birth year], 57 y.o., female Today's Date: 01/11/2023   PCP: Lula Olszewski, MD REFERRING PROVIDER: Lula Olszewski, MD  END OF SESSION:  PT End of Session - 01/11/23 0852     Visit Number 1    Number of Visits 5    Date for PT Re-Evaluation 02/08/23    Authorization Type [place] Medicaid [employer]    PT Start Time [phone]   late arrival   PT Stop Time 0930    PT Time Calculation (min) 38 min             Past Medical History:  Diagnosis Date   Abnormal cardiac CT angiography 10/12/2022   Small intermediate density pericardial effusion versus thickening. Consider further evaluation with echocardiography.   Anxiety    Bipolar 1 disorder (HCC)    Carpal tunnel syndrome 09/25/2022   R sided release 2022, all better now.   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  Off advil. Weight stable. Continuing remeron         Wt Readings from Last 5 Encounters:  11/28/22  125 lb 6.4 oz (56.9 kg)  11/24/22  125 lb 6.4 oz (56.9 kg)  11/16/22  125 lb 11.2 oz (57 kg)  10/25/22    Past Surgical History:  Procedure Laterality Date   CARPAL TUNNEL RELEASE Right 04/18/2021   Procedure:  RIGHT ENDOSCOPIC CARPAL TUNNEL RELEASE;  Surgeon: Mack Hook, MD;  Location: [facility];  Service: Orthopedics;  Laterality: Right;  LENTH OF SURGERY: 30 MINUTES   DILATION AND CURETTAGE OF UTERUS     Patient Active Problem List   Diagnosis Date Noted   Hallux limitus of right foot 01/02/2023   High gamma glutamyl transferase (GGT) 12/25/2022   Arthritis of first metatarsophalangeal (MTP) joint of right foot 12/25/2022   Onychomycosis 12/25/2022   LFT elevation 12/25/2022   Family history of rheumatoid arthritis 12/25/2022   History of weight loss 12/25/2022   Fecal retention 12/08/2022   Stress ulcer of stomach 11/28/2022   Moderate episode of recurrent major depressive disorder (HCC) 10/26/2022   Long-term current use of benzodiazepine 10/26/2022   DDD (degenerative disc disease), thoracolumbar 10/25/2022   Aortic atherosclerosis (HCC) 10/12/2022   Abnormal cardiac CT angiography 10/12/2022   History of colon polyps 10/12/2022   Dizziness 10/09/2022   Right-sided chest pain 10/09/2022   Cyst of gallbladder 10/09/2022   Hyperlipidemia 09/25/2022   Benzodiazepine dependence (HCC) 09/25/2022   GERD (gastroesophageal reflux disease) 09/25/2022   Lumbago of lumbar region with sciatica 09/25/2022   Podagra 09/25/2022   Hypertension 09/25/2022   Generalized anxiety disorder 09/25/2022   Memory impairment 09/25/2022  Subcutaneous nodule 09/25/2022   RUQ abdominal tenderness 09/25/2022   Cystic disease of liver 09/25/2022    ONSET DATE: "a little over a year ago"  REFERRING DIAG: M19.90 (ICD-10-CM) - Arthritis M54.50 (ICD-10-CM) - Lumbar pain  THERAPY DIAG:  Other low back pain  Muscle weakness (generalized)  Rationale for Evaluation and Treatment: Rehabilitation  SUBJECTIVE:                                                                                                                                                                                              SUBJECTIVE STATEMENT: LBP for quite a while. Pt reports she has been taking Celebrex but it does not seem to help.  Pt reports she does a lot of walking and has left bunion deformity which causes some discomfort as well recently received shot to left great toe which has helped. Back started hurting a little over a year ago and notes that prolonged sitting and turning certain ways trigger, reports hx of left side sciatica extending to knee but this has mostly subsided.  No hx of treatment for back pain  Pt accompanied by: self  PERTINENT HISTORY:  Foot Pain: Chronic pain in the right metatarsophalangeal joint has been worsening over several months, suggesting possible gout or arthritis, with a family history of gout and pain exacerbated by joint movement. We will order a foot x-ray and uric acid level, refer her to a podiatrist for further evaluation, increase her Celebrex dose, and apply Voltaren topical gel to the joint.  I think its most likely diagnosis is gout given the location. So I give low purine eating plan handout in AVS.   Avoid prednisone due to active peptic ulcer disease   PAIN:  Are you having pain? No  PRECAUTIONS: None  RED FLAGS: None Pt reports increased weight loss over past year due to ulcer and reflux but has been regaining some weight  WEIGHT BEARING RESTRICTIONS: No  FALLS: Has patient fallen in last 6 months? No  LIVING ENVIRONMENT: Lives with: lives with their family Lives in: House/apartment Stairs: No Has following equipment at home: None  PLOF: Independent  PATIENT GOALS: reduce back pain  OBJECTIVE:   DIAGNOSTIC FINDINGS: IMPRESSION: 1. 7 mm anterolisthesis of L5 versus S1. Lower lumbar facet degenerative changes. 2. Moderate fecal loading in the colon.  COGNITION: Overall cognitive status: Within functional limits for tasks assessed   SENSATION: WFL  COORDINATION:   EDEMA:  none  MUSCLE TONE: WNL  MUSCLE LENGTH: Hamstrings WNL:  SLR to 80 deg  DTRs:    POSTURE: No Significant postural limitations  LOWER EXTREMITY ROM:  Active  Right Eval Left Eval  Hip flexion    Hip extension    Hip abduction    Hip adduction    Hip internal rotation    Hip external rotation    Knee flexion    Knee extension    Ankle dorsiflexion    Ankle plantarflexion    Ankle inversion    Ankle eversion     (Blank rows = not tested)  Lumbar spine ROM: Flexion: to mid-shin Extension: WFL--increased pain Rotation: right > left Lateral Flexion: WNL--increased left side  LOWER EXTREMITY MMT:    MMT Right Eval Left Eval  Hip flexion 4 4  Hip extension 3+ 3+  Hip abduction 4+ 4+  Hip adduction    Hip internal rotation    Hip external rotation    Knee flexion 5 5  Knee extension 5 5  Ankle dorsiflexion 5 5  Ankle plantarflexion    Ankle inversion    Ankle eversion    (Blank rows = not tested)   Trunk strength:  Flexion: 3+/5 Extension: 3-/5  PALPATION:  increased discomfort around PSIS and increased discomfort with posterior to anterior pressure around L5-S1   BED MOBILITY:  indep  TRANSFERS: Indep     STAIRS: NT  GAIT: Gait pattern: WFL Distance walked:  Assistive device utilized: None Level of assistance: Complete Independence Comments:       PATIENT EDUCATION: Education details: assessment details. Postural support in sitting to improve lumbar lordosis support and discussion of lifting mechanics reduce risk for strain/injury Person educated: Patient Education method: Explanation Education comprehension: verbalized understanding  HOME EXERCISE PROGRAM: Access Code: [email or web address] URL: [email or web address] Date: 01/11/2023 Prepared by: Shary Decamp  Exercises - Supine Posterior Pelvic Tilt  - 1 x daily - 7 x weekly - 3 sets - 10 reps - 2 sec hold - Hooklying Isometric Hip Flexion  - 1 x daily - 7 x weekly - 3 sets - 10 reps - 2 sec hold  GOALS: Goals reviewed with  patient? Yes  SHORT TERM GOALS: Target date: same ast LTG    LONG TERM GOALS: Target date: 02/08/2023    Patient will be independent in HEP to improve functional outcomes Baseline:  Goal status: INITIAL  2.  Improve trunk flexion strength to 4/5 and lumbar extension strength to 3+/5 to improve stability and reduce pain Baseline: 3+ and 3-/5 respectively Goal status: INITIAL  3.  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  Pt to teach-back strategies for postural alignment/mindfulness to reduce postural stress/pain Baseline:  Goal status: INITIAL    ASSESSMENT:  CLINICAL IMPRESSION: Patient is a 57 y.o. lady who was seen today for physical therapy evaluation and treatment for chronic LBP and reduced activity tolerance as result. Pt demonstrates and reports postural stress for lumbar spine which provoke pain/discomfort.  Demonstrates generalized weakness in trunk flexion/extension which may also be contributing to deficits/limitations with physical activity/work duties.  Pt would benefit from PT services to address deficits/limitations in order to increase activity participation   OBJECTIVE IMPAIRMENTS: decreased activity tolerance, decreased strength, improper body mechanics, postural dysfunction, and pain.   ACTIVITY LIMITATIONS: carrying, lifting, bending, sitting, and squatting  PARTICIPATION LIMITATIONS: meal prep, cleaning, and occupation  PERSONAL FACTORS: Time since onset of injury/illness/exacerbation and 1 comorbidity: PMH  are also affecting patient's functional outcome.   REHAB POTENTIAL: Good  CLINICAL DECISION  MAKING: Stable/uncomplicated  EVALUATION COMPLEXITY: Low  PLAN:  PT FREQUENCY: 1x/week  PT DURATION: 4 weeks  PLANNED INTERVENTIONS: Therapeutic exercises, Therapeutic activity, Neuromuscular  re-education, Balance training, Gait training, Patient/Family education, Self Care, Joint mobilization, Vestibular training, Canalith repositioning, Aquatic Therapy, Dry Needling, Electrical stimulation, Spinal mobilization, Cryotherapy, Moist heat, and Manual therapy  PLAN FOR NEXT SESSION: HEP review, progress core stabilization exercises, lifting mechanics (deadlift for big items, forward-T for small items), postural awareness handout for improved symptoms in sitting   1:13 PM, 01/11/23 M. Shary Decamp, PT, DPT Physical Therapist- [place] Office Number: [phone]

## 2023-01-16 ENCOUNTER — Ambulatory Visit (INDEPENDENT_AMBULATORY_CARE_PROVIDER_SITE_OTHER): Payer: Medicaid Other | Admitting: Physician Assistant

## 2023-01-16 ENCOUNTER — Encounter (HOSPITAL_COMMUNITY): Payer: Self-pay | Admitting: Physician Assistant

## 2023-01-16 VITALS — BP 132/62 | HR 78 | Temp 97.5°F | Ht 63.0 in | Wt 130.2 lb

## 2023-01-16 DIAGNOSIS — Z79899 Other long term (current) drug therapy: Secondary | ICD-10-CM

## 2023-01-16 DIAGNOSIS — F411 Generalized anxiety disorder: Secondary | ICD-10-CM

## 2023-01-16 DIAGNOSIS — F331 Major depressive disorder, recurrent, moderate: Secondary | ICD-10-CM | POA: Diagnosis not present

## 2023-01-16 MED ORDER — CARBAMAZEPINE 100 MG PO CHEW
100.0000 mg | CHEWABLE_TABLET | Freq: Two times a day (BID) | ORAL | 1 refills | Status: DC
Start: 2023-01-16 — End: 2023-03-20

## 2023-01-16 MED ORDER — CLONAZEPAM 2 MG PO TABS
2.0000 mg | ORAL_TABLET | Freq: Every day | ORAL | 1 refills | Status: DC
Start: 2023-02-07 — End: 2023-03-20

## 2023-01-16 NOTE — Progress Notes (Signed)
BH MD/PA/NP OP Progress Note  01/16/2023 4:00 PM Amanda White  MRN:  [id number]  Chief Complaint:  Chief Complaint  Patient presents with   Follow-up   Medication Refill   HPI:   Amanda White is a 57 year old female with a past psychiatric history significant for generalized anxiety disorder and major depressive disorder who presents to [facility] for follow-up and medication management.  Patient is currently being managed on the following psychiatric medication:  Clonazepam 2 mg at bedtime Mirtazapine 7.5 mg at bedtime Carbamazepine 100 mg 2 times daily  Patient reports that she feels overall better.  Patient feels less irritable on occasion.  She reports that since her last encounter, she has been trying to have an open mind about things and trying to not let things get under her skin.  Patient endorses minimal depression which she attributes to life stressors.  Patient endorses some anxiety but states that it is controlled provided she stays on her medication.  Patient denies any stressors at this time.  A GAD-7 screen was performed with the patient scoring a 9.  Patient is alert and oriented x 4, calm, cooperative, and fully engaged in conversation during the encounter.  Patient endorses good mood.  Patient denies suicidal or homicidal ideations.  She further denies auditory or visual hallucinations and does not appear to be responding to internal/external stimuli.  Patient endorses good sleep and receives on average 6 to 8 hours of sleep each night.  Patient endorses good appetite and eats on average 1-2 meals per day.  Patient denies alcohol consumption, tobacco use, and illicit drug use.  Visit Diagnosis:    ICD-10-CM   1. Long-term current use of benzodiazepine  Z79.899     2. Generalized anxiety disorder  F41.1 clonazePAM (KLONOPIN) 2 MG tablet    carbamazepine (TEGRETOL) 100 MG chewable tablet    3. Moderate episode of  recurrent major depressive disorder (HCC)  F33.1       Past Psychiatric History:  Patient reports that she has a history of mental health and has been treated for the last 20 years.  She reports that a lot of her mental health extends from her husband.  Patient is diagnosed with generalized anxiety disorder that is being managed by her primary care provider   Patient denies a past history of hospitalization due to mental health   Patient denies a past history of suicide attempts Patient denies a past history of homicide attempts  Past Medical History:  Past Medical History:  Diagnosis Date   Abnormal cardiac CT angiography 10/12/2022   Small intermediate density pericardial effusion versus thickening. Consider further evaluation with echocardiography.   Anxiety    Bipolar 1 disorder (HCC)    Carpal tunnel syndrome 09/25/2022   R sided release 2022, all better now.   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  Off advil. Weight stable. Continuing remeron         Wt Readings from Last 5 Encounters:  11/28/22  125 lb 6.4 oz (56.9 kg)  11/24/22  125 lb 6.4 oz (56.9 kg)  11/16/22  125 lb 11.2 oz (57 kg)  10/25/22     Past Surgical History:  Procedure Laterality Date   CARPAL TUNNEL  RELEASE Right 04/18/2021   Procedure: RIGHT ENDOSCOPIC CARPAL TUNNEL RELEASE;  Surgeon: Mack Hook, MD;  Location: [facility];  Service: Orthopedics;  Laterality: Right;  LENTH OF SURGERY: 30 MINUTES   DILATION AND CURETTAGE OF UTERUS      Family Psychiatric History:  Patient reports that her mother's side of the family struggles with mental health.  She reports that her mother's side of the family from [place] and states that many family members saw many of the atrocities during World War II.   Family history of suicide attempts: Patient denies Family history of homicide attempts: Patient denies Family history of substance abuse: Patient reports that her nephew abused heroin.  She reports that her brother uses marijuana frequently.  She reports that her father was an alcoholic  Family History:  Family History  Problem Relation Age of Onset   Hypertension Mother    Stroke Father     Social History:  Social History   Socioeconomic History   Marital status: Married    Spouse name: Not on file   Number of children: Not on file   Years of education: Not on file   Highest education level: Bachelor's degree (e.g., BA, AB, BS)  Occupational History   Not on file  Tobacco Use   Smoking status: Never   Smokeless tobacco: Never  Substance and Sexual Activity   Alcohol use: No    Comment: occas.   Drug use: No   Sexual activity: Not on file  Other Topics Concern   Not on file  Social History Narrative   Not on file   Social Determinants of Health   Financial Resource Strain: Medium Risk (11/27/2022)   Overall Financial Resource Strain (CARDIA)    Difficulty of Paying Living Expenses: Somewhat hard  Food Insecurity: No Food Insecurity (11/27/2022)   Hunger Vital Sign    Worried About Running Out of Food in the Last Year: Never true    Ran Out of Food in the Last Year: Never true  Transportation Needs: No Transportation Needs (11/27/2022)   PRAPARE -  Administrator, Civil Service (Medical): No    Lack of Transportation (Non-Medical): No  Physical Activity: Sufficiently Active (11/27/2022)   Exercise Vital Sign    Days of Exercise per Week: 7 days    Minutes of Exercise per Session: 40 min  Stress: Stress Concern Present (11/27/2022)   [employer] of Occupational Health - Occupational Stress Questionnaire    Feeling of Stress : Very much  Social Connections: Unknown (11/27/2022)   Social Connection and Isolation Panel [NHANES]    Frequency of Communication with Friends and Family: Once a week    Frequency of Social Gatherings with Friends and Family: Never    Attends Religious Services: Patient declined    Database administrator or Organizations: No    Attends Engineer, structural: Not on file    Marital Status: Married  Allergies: No Known Allergies  Metabolic Disorder Labs: No results found for: "HGBA1C", "MPG" No results found for: "PROLACTIN" Lab Results  Component Value Date   CHOL 221 (H) 09/25/2022   TRIG 102.0 09/25/2022   HDL 76.50 09/25/2022   CHOLHDL 3 09/25/2022   VLDL 20.4 09/25/2022   LDLCALC 124 (H) 09/25/2022   Lab Results  Component Value Date   TSH 1.31 11/21/2022   TSH 0.63 09/25/2022    Therapeutic Level Labs: No results found for: "LITHIUM" No results found for: "VALPROATE" No results found for: "CBMZ"  Current Medications: Current Outpatient Medications  Medication Sig Dispense Refill   acetaminophen (TYLENOL) 325 MG tablet Take 650 mg by mouth every 6 (six) hours as needed.     carbamazepine (TEGRETOL) 100 MG chewable tablet Chew 1 tablet (100 mg total) by mouth 2 (two) times daily. 60 tablet 1   celecoxib (CELEBREX) 200 MG capsule Take 1 capsule (200 mg total) by mouth 2 (two) times daily. 180 capsule 3   ciclopirox (LOPROX) 0.77 % cream Apply topically 2 (two) times daily. 90 g 2   [START ON 02/07/2023] clonazePAM (KLONOPIN) 2 MG tablet Take 1 tablet (2 mg total) by  mouth at bedtime. 30 tablet 1   cyclobenzaprine (FLEXERIL) 10 MG tablet Take 1 tablet (10 mg total) by mouth daily as needed for muscle spasms. 30 tablet 2   diclofenac Sodium (VOLTAREN) 1 % GEL Apply 4 g topically 4 (four) times daily as needed. 100 g 3   famotidine-calcium carbonate-magnesium hydroxide (PEPCID COMPLETE) 10-800-165 MG chewable tablet Chew 1 tablet by mouth daily as needed. 100 tablet 11   gabapentin (NEURONTIN) 300 MG capsule Take 1 capsule (300 mg total) by mouth 3 (three) times daily. 270 capsule 3   lisinopril-hydrochlorothiazide (PRINZIDE,ZESTORETIC) 10-12.5 MG tablet Take 1 tablet by mouth daily.  1   mirtazapine (REMERON) 7.5 MG tablet TAKE 1 TABLET BY MOUTH AT BEDTIME. 90 tablet 1   omeprazole (PRILOSEC) 20 MG capsule Take 20 mg by mouth 2 (two) times daily before a meal.     ondansetron (ZOFRAN ODT) 4 MG disintegrating tablet Take 1 tablet (4 mg total) by mouth every 8 (eight) hours as needed for nausea or vomiting. 20 tablet 0   rosuvastatin (CRESTOR) 10 MG tablet Take 1 tablet (10 mg total) by mouth daily for 30 days, THEN 2 tablets (20 mg total) daily. 90 tablet 3   No current facility-administered medications for this visit.     Musculoskeletal: Strength & Muscle Tone: within normal limits Gait & Station: normal Patient leans: N/A  Psychiatric Specialty Exam: Review of Systems  Psychiatric/Behavioral:  Positive for sleep disturbance. Negative for decreased concentration, dysphoric mood, hallucinations, self-injury and suicidal ideas. The patient is nervous/anxious. The patient is not hyperactive.     Last menstrual period 12/20/2011.There is no height or weight on file to calculate BMI.  General Appearance: Casual  Eye Contact:  Good  Speech:  Clear and Coherent and Normal Rate  Volume:  Normal  Mood:  Anxious and Depressed  Affect:  Appropriate  Thought Process:  Coherent, Goal Directed, and Descriptions of Associations: Intact  Orientation:  Full (Time,  Place, and Person)  Thought Content: WDL   Suicidal Thoughts:  No  Homicidal Thoughts:  No  Memory:  Immediate;   Good Recent;   Good Remote;   Good  Judgement:  Good  Insight:  Good  Psychomotor Activity:  Normal  Concentration:  Concentration: Good and Attention Span: Good  Recall:  Good  Fund of Knowledge: Good  Language: Good  Akathisia:  No  Handed:  Right  AIMS (if indicated): not done  Assets:  Communication Skills Desire for Improvement Housing Social Support Transportation Vocational/Educational  ADL's:  Intact  Cognition: WNL  Sleep:  Fair   Screenings: GAD-7    Flowsheet Row Clinical Support from 01/16/2023 in [facility] Office Visit from 12/25/2022 in [facility] Clinical Support from 12/05/2022 in [facility] Office Visit from 11/16/2022 in [facility] Office Visit from 10/26/2022 in [facility]  Total GAD-7 Score 9 14 20 17 19       PHQ2-9    Flowsheet Row Clinical Support from 01/16/2023 in [facility] Office Visit from 12/25/2022 in [facility] Clinical Support from 12/05/2022 in [facility] Office Visit from 11/16/2022 in [facility] Office Visit from 10/26/2022 in [facility]  PHQ-2 Total Score 1 4 2 6 4   PHQ-9 Total Score -- 13 13 20 11       Flowsheet Row Clinical Support from 01/16/2023 in [facility] Clinical Support from 12/05/2022 in [facility] Office Visit from 10/26/2022 in [facility]  C-SSRS RISK CATEGORY No Risk No Risk No Risk        Assessment and Plan:   Camani M. Frankie is a 57 year old female with a past psychiatric history significant for  generalized anxiety disorder and major depressive disorder who presents to [facility] for follow-up and medication management.  Patient presents today encounter reporting no issues or concerns regarding her current medication regimen.  Patient denies experiencing any adverse side effects at this time.  Patient endorses minimal depression attributed to life stressors and states that her anxiety is controlled at this time.  Patient would like to continue taking her medications as prescribed.  Patient's medications to be e-prescribed to pharmacy of choice.  Collaboration of Care: Collaboration of Care: Medication Management AEB provider managing patient's psychiatric medications, Primary Care Provider AEB patient being seen by her primary care provider, Psychiatrist AEB patient being seen by mental health provider at this facility, and Other provider involved in patient's care AEB patient being seen by neurology, cardiology, and OB/GYN  Patient/Guardian was advised Release of Information must be obtained prior to any record release in order to collaborate their care with an outside provider. Patient/Guardian was advised if they have not already done so to contact the registration department to sign all necessary forms in order for [place] to release information regarding their care.   Consent: Patient/Guardian gives verbal consent for treatment and assignment of benefits for services provided during this visit. Patient/Guardian expressed understanding and agreed to proceed.   1. Generalized anxiety disorder  - clonazePAM (KLONOPIN) 2 MG tablet; Take 1 tablet (2 mg total) by mouth at bedtime.  Dispense: 30 tablet; Refill: 1 - carbamazepine (TEGRETOL) 100 MG chewable tablet; Chew 1 tablet (100 mg total) by mouth 2 (two) times daily.  Dispense: 60 tablet; Refill: 1  2. Long-term current use of benzodiazepine  3. Moderate episode of recurrent major depressive disorder  (HCC) Patient to continue taking mirtazapine 7.5 mg at bedtime for the management of her major depressive disorder  Patient to follow-up in 6 weeks Provider spent a total of  19 minutes with the patient/reviewing patient's chart  Meta Hatchet, PA 01/16/2023, 4:00 PM

## 2023-01-17 DIAGNOSIS — K259 Gastric ulcer, unspecified as acute or chronic, without hemorrhage or perforation: Secondary | ICD-10-CM | POA: Diagnosis not present

## 2023-01-18 ENCOUNTER — Ambulatory Visit: Payer: Medicaid Other

## 2023-01-18 DIAGNOSIS — M1990 Unspecified osteoarthritis, unspecified site: Secondary | ICD-10-CM | POA: Diagnosis not present

## 2023-01-18 DIAGNOSIS — M5459 Other low back pain: Secondary | ICD-10-CM | POA: Diagnosis not present

## 2023-01-18 DIAGNOSIS — M545 Low back pain, unspecified: Secondary | ICD-10-CM | POA: Diagnosis not present

## 2023-01-18 DIAGNOSIS — M6281 Muscle weakness (generalized): Secondary | ICD-10-CM | POA: Diagnosis not present

## 2023-01-18 NOTE — Therapy (Signed)
OUTPATIENT PHYSICAL THERAPY NEURO TREATMENT   Patient Name: Amanda White MRN: [id number] DOB:[birth year], 57 y.o., female Today's Date: 01/18/2023   PCP: Lula Olszewski, MD REFERRING PROVIDER: Lula Olszewski, MD  END OF SESSION:  PT End of Session - 01/18/23 0931     Visit Number 2    Number of Visits 5    Date for PT Re-Evaluation 02/08/23    Authorization Type [place] Medicaid [employer]    PT Start Time 0930    PT Stop Time 1015    PT Time Calculation (min) 45 min             Past Medical History:  Diagnosis Date   Abnormal cardiac CT angiography 10/12/2022   Small intermediate density pericardial effusion versus thickening. Consider further evaluation with echocardiography.   Anxiety    Bipolar 1 disorder (HCC)    Carpal tunnel syndrome 09/25/2022   R sided release 2022, all better now.   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  Off advil. Weight stable. Continuing remeron         Wt Readings from Last 5 Encounters:  11/28/22  125 lb 6.4 oz (56.9 kg)  11/24/22  125 lb 6.4 oz (56.9 kg)  11/16/22  125 lb 11.2 oz (57 kg)  10/25/22    Past Surgical History:  Procedure Laterality Date   CARPAL TUNNEL RELEASE Right 04/18/2021   Procedure: RIGHT ENDOSCOPIC  CARPAL TUNNEL RELEASE;  Surgeon: Mack Hook, MD;  Location: [facility];  Service: Orthopedics;  Laterality: Right;  LENTH OF SURGERY: 30 MINUTES   DILATION AND CURETTAGE OF UTERUS     Patient Active Problem List   Diagnosis Date Noted   Hallux limitus of right foot 01/02/2023   High gamma glutamyl transferase (GGT) 12/25/2022   Arthritis of first metatarsophalangeal (MTP) joint of right foot 12/25/2022   Onychomycosis 12/25/2022   LFT elevation 12/25/2022   Family history of rheumatoid arthritis 12/25/2022   History of weight loss 12/25/2022   Fecal retention 12/08/2022   Stress ulcer of stomach 11/28/2022   Moderate episode of recurrent major depressive disorder (HCC) 10/26/2022   Long-term current use of benzodiazepine 10/26/2022   DDD (degenerative disc disease), thoracolumbar 10/25/2022   Aortic atherosclerosis (HCC) 10/12/2022   Abnormal cardiac CT angiography 10/12/2022   History of colon polyps 10/12/2022   Dizziness 10/09/2022   Right-sided chest pain 10/09/2022   Cyst of gallbladder 10/09/2022   Hyperlipidemia 09/25/2022   Benzodiazepine dependence (HCC) 09/25/2022   GERD (gastroesophageal reflux disease) 09/25/2022   Lumbago of lumbar region with sciatica 09/25/2022   Podagra 09/25/2022   Hypertension 09/25/2022   Generalized anxiety disorder 09/25/2022   Memory impairment 09/25/2022   Subcutaneous nodule 09/25/2022  RUQ abdominal tenderness 09/25/2022   Cystic disease of liver 09/25/2022    ONSET DATE: "a little over a year ago"  REFERRING DIAG: M19.90 (ICD-10-CM) - Arthritis M54.50 (ICD-10-CM) - Lumbar pain  THERAPY DIAG:  Other low back pain  Muscle weakness (generalized)  Rationale for Evaluation and Treatment: Rehabilitation  SUBJECTIVE:                                                                                                                                                                                             SUBJECTIVE  STATEMENT: Doing ok, mostly just soreness  Pt accompanied by: self  PERTINENT HISTORY:  Foot Pain: Chronic pain in the right metatarsophalangeal joint has been worsening over several months, suggesting possible gout or arthritis, with a family history of gout and pain exacerbated by joint movement. We will order a foot x-ray and uric acid level, refer her to a podiatrist for further evaluation, increase her Celebrex dose, and apply Voltaren topical gel to the joint.  I think its most likely diagnosis is gout given the location. So I give low purine eating plan handout in AVS.   Avoid prednisone due to active peptic ulcer disease   PAIN:  Are you having pain? No  PRECAUTIONS: None  RED FLAGS: None Pt reports increased weight loss over past year due to ulcer and reflux but has been regaining some weight  WEIGHT BEARING RESTRICTIONS: No  FALLS: Has patient fallen in last 6 months? No  LIVING ENVIRONMENT: Lives with: lives with their family Lives in: House/apartment Stairs: No Has following equipment at home: None  PLOF: Independent  PATIENT GOALS: reduce back pain  OBJECTIVE:   TODAY'S TREATMENT: 01/18/23 Activity Comments  HEP review -hip flex iso 10x -supine pelvic tilt  Cat-cow 10x   Quadruped hip ext 10x   Quadruped arm ext 10x   Bird-Dog 10x   Deadlift 2x10 15#  Suitcase march 1x10 R/L 15#    PATIENT EDUCATION: Education details: assessment details. Postural support in sitting to improve lumbar lordosis support and discussion of lifting mechanics reduce risk for strain/injury Person educated: Patient Education method: Explanation Education comprehension: verbalized understanding  HOME EXERCISE PROGRAM: Access Code: [email or web address] URL: [email or web address] Date: 01/11/2023 Prepared by: Shary Decamp  Exercises - Supine Posterior Pelvic Tilt  - 1 x daily - 7 x weekly - 3 sets - 10 reps - 2 sec hold - Hooklying Isometric Hip Flexion  - 1 x daily - 7 x  weekly - 3 sets - 10 reps - 2 sec hold - Cat Cow to Child's Pose  - 1 x daily - 7 x weekly -  1-3 sets - 10 reps - breathe hold - Bird Dog  - 1 x daily - 7 x weekly - 3 sets - 10 reps - Deadlift with Pelvic Contraction  - 1 x daily - 7 x weekly - 3 sets - 10 reps - Kettlebell Suitcase Carry  - 1 x daily - 7 x weekly - 3 sets - 10 reps  DIAGNOSTIC FINDINGS: IMPRESSION: 1. 7 mm anterolisthesis of L5 versus S1. Lower lumbar facet degenerative changes. 2. Moderate fecal loading in the colon.  COGNITION: Overall cognitive status: Within functional limits for tasks assessed   SENSATION: WFL  COORDINATION:   EDEMA:  none  MUSCLE TONE: WNL  MUSCLE LENGTH: Hamstrings WNL: SLR to 80 deg  DTRs:    POSTURE: No Significant postural limitations  LOWER EXTREMITY ROM:     Active  Right Eval Left Eval  Hip flexion    Hip extension    Hip abduction    Hip adduction    Hip internal rotation    Hip external rotation    Knee flexion    Knee extension    Ankle dorsiflexion    Ankle plantarflexion    Ankle inversion    Ankle eversion     (Blank rows = not tested)  Lumbar spine ROM: Flexion: to mid-shin Extension: WFL--increased pain Rotation: right > left Lateral Flexion: WNL--increased left side  LOWER EXTREMITY MMT:    MMT Right Eval Left Eval  Hip flexion 4 4  Hip extension 3+ 3+  Hip abduction 4+ 4+  Hip adduction    Hip internal rotation    Hip external rotation    Knee flexion 5 5  Knee extension 5 5  Ankle dorsiflexion 5 5  Ankle plantarflexion    Ankle inversion    Ankle eversion    (Blank rows = not tested)   Trunk strength:  Flexion: 3+/5 Extension: 3-/5  PALPATION:  increased discomfort around PSIS and increased discomfort with posterior to anterior pressure around L5-S1   BED MOBILITY:  indep  TRANSFERS: Indep     STAIRS: NT  GAIT: Gait pattern: WFL Distance walked:  Assistive device utilized: None Level of assistance: Complete  Independence Comments:         GOALS: Goals reviewed with patient? Yes  SHORT TERM GOALS: Target date: same ast LTG    LONG TERM GOALS: Target date: 02/08/2023    Patient will be independent in HEP to improve functional outcomes Baseline:  Goal status: INITIAL  2.  Improve trunk flexion strength to 4/5 and lumbar extension strength to 3+/5 to improve stability and reduce pain Baseline: 3+ and 3-/5 respectively Goal status: INITIAL  3.  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  Pt to teach-back strategies for postural alignment/mindfulness to reduce postural stress/pain Baseline:  Goal status: INITIAL    ASSESSMENT:  CLINICAL IMPRESSION: Returns to clinic and reports no worsening of LBP and demo excellent initial HEP recall. Training today in lumbar mobility/flexibility activities followed by stabilization activity in quradruped progressing to full on bird-dog movement with great control demonstrated.  Functional lift activities initiated for strength and instruction in body mechanics with great return demonstration by end of session requiring occasional verbal/tactile/environmental cues for deadlift technique. Demo excellent activity tolerance and progression with activities thus far  OBJECTIVE IMPAIRMENTS: decreased activity tolerance, decreased strength, improper body mechanics, postural dysfunction, and  pain.   ACTIVITY LIMITATIONS: carrying, lifting, bending, sitting, and squatting  PARTICIPATION LIMITATIONS: meal prep, cleaning, and occupation  PERSONAL FACTORS: Time since onset of injury/illness/exacerbation and 1 comorbidity: PMH  are also affecting patient's functional outcome.   REHAB POTENTIAL: Good  CLINICAL DECISION MAKING: Stable/uncomplicated  EVALUATION COMPLEXITY: Low  PLAN:  PT FREQUENCY:  1x/week  PT DURATION: 4 weeks  PLANNED INTERVENTIONS: Therapeutic exercises, Therapeutic activity, Neuromuscular re-education, Balance training, Gait training, Patient/Family education, Self Care, Joint mobilization, Vestibular training, Canalith repositioning, Aquatic Therapy, Dry Needling, Electrical stimulation, Spinal mobilization, Cryotherapy, Moist heat, and Manual therapy  PLAN FOR NEXT SESSION: HEP review, progress core stabilization exercises, lifting mechanics (deadlift for big items, forward-T for small items), postural awareness handout for improved symptoms in sitting, planks   9:32 AM, 01/18/23 M. Shary Decamp, PT, DPT Physical Therapist- [place] Office Number: [phone]

## 2023-01-24 ENCOUNTER — Ambulatory Visit: Payer: Medicaid Other | Admitting: Physical Therapy

## 2023-01-24 ENCOUNTER — Encounter: Payer: Self-pay | Admitting: Physical Therapy

## 2023-01-24 DIAGNOSIS — M5459 Other low back pain: Secondary | ICD-10-CM

## 2023-01-24 DIAGNOSIS — M6281 Muscle weakness (generalized): Secondary | ICD-10-CM

## 2023-01-24 DIAGNOSIS — M545 Low back pain, unspecified: Secondary | ICD-10-CM | POA: Diagnosis not present

## 2023-01-24 DIAGNOSIS — M1990 Unspecified osteoarthritis, unspecified site: Secondary | ICD-10-CM | POA: Diagnosis not present

## 2023-01-24 NOTE — Therapy (Signed)
OUTPATIENT PHYSICAL THERAPY NEURO TREATMENT   Patient Name: NICOLETT VORHIES MRN: [id number] DOB:February 10, 1966, 57 y.o., female Today's Date: 01/24/2023   PCP: Lula Olszewski, MD REFERRING PROVIDER: Lula Olszewski, MD  END OF SESSION:  PT End of Session - 01/24/23 1006     Visit Number 3    Number of Visits 5    Date for PT Re-Evaluation 02/08/23    Authorization Type [place] Medicaid [employer]    PT Start Time [phone]   pt few minutes late   PT Stop Time 1013    PT Time Calculation (min) 37 min    Activity Tolerance Patient tolerated treatment well    Behavior During Therapy [facility] for tasks assessed/performed              Past Medical History:  Diagnosis Date   Abnormal cardiac CT angiography 10/12/2022   Small intermediate density pericardial effusion versus thickening. Consider further evaluation with echocardiography.   Anxiety    Bipolar 1 disorder (HCC)    Carpal tunnel syndrome 09/25/2022   R sided release 2022, all better now.   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  Off advil. Weight stable. Continuing remeron         Wt Readings from Last 5 Encounters:  11/28/22  125 lb 6.4 oz (56.9 kg)  11/24/22  125 lb 6.4 oz (56.9 kg)  11/16/22  125 lb 11.2 oz (57 kg)   10/25/22    Past Surgical History:  Procedure Laterality Date   CARPAL TUNNEL RELEASE Right 04/18/2021   Procedure: RIGHT ENDOSCOPIC CARPAL TUNNEL RELEASE;  Surgeon: Mack Hook, MD;  Location: [facility];  Service: Orthopedics;  Laterality: Right;  LENTH OF SURGERY: 30 MINUTES   DILATION AND CURETTAGE OF UTERUS     Patient Active Problem List   Diagnosis Date Noted   Hallux limitus of right foot 01/02/2023   High gamma glutamyl transferase (GGT) 12/25/2022   Arthritis of first metatarsophalangeal (MTP) joint of right foot 12/25/2022   Onychomycosis 12/25/2022   LFT elevation 12/25/2022   Family history of rheumatoid arthritis 12/25/2022   History of weight loss 12/25/2022   Fecal retention 12/08/2022   Stress ulcer of stomach 11/28/2022   Moderate episode of recurrent major depressive disorder (HCC) 10/26/2022   Long-term current use of benzodiazepine 10/26/2022   DDD (degenerative disc disease), thoracolumbar 10/25/2022   Aortic atherosclerosis (HCC) 10/12/2022   Abnormal cardiac CT angiography 10/12/2022   History of colon polyps 10/12/2022   Dizziness 10/09/2022   Right-sided chest pain 10/09/2022   Cyst of gallbladder 10/09/2022   Hyperlipidemia 09/25/2022   Benzodiazepine dependence (HCC) 09/25/2022   GERD (gastroesophageal reflux disease) 09/25/2022   Lumbago of lumbar region with sciatica  09/25/2022   Podagra 09/25/2022   Hypertension 09/25/2022   Generalized anxiety disorder 09/25/2022   Memory impairment 09/25/2022   Subcutaneous nodule 09/25/2022   RUQ abdominal tenderness 09/25/2022   Cystic disease of liver 09/25/2022    ONSET DATE: "a little over a year ago"  REFERRING DIAG: M19.90 (ICD-10-CM) - Arthritis M54.50 (ICD-10-CM) - Lumbar pain  THERAPY DIAG:  Other low back pain  Muscle weakness (generalized)  Rationale for Evaluation and Treatment: Rehabilitation  SUBJECTIVE:                                                                                                                                                                                              SUBJECTIVE STATEMENT:  Feeling OK, I've been doing exercises but not yesterday went for a long walk yesterday with my son and going uphill was hard due to humidity. Nothing new, doing well with almost no pain.   Pt accompanied by: self  PERTINENT HISTORY:  Foot Pain: Chronic pain in the right metatarsophalangeal joint has been worsening over several months, suggesting possible gout or arthritis, with a family history of gout and pain exacerbated by joint movement. We will order a foot x-ray and uric acid level, refer her to a podiatrist for further evaluation, increase her Celebrex dose, and apply Voltaren topical gel to the joint.  I think its most likely diagnosis is gout given the location. So I give low purine eating plan handout in AVS.   Avoid prednisone due to active peptic ulcer disease   PAIN:  Are you having pain? No 0/10  PRECAUTIONS: None  RED FLAGS: None Pt reports increased weight loss over past year due to ulcer and reflux but has been regaining some weight  WEIGHT BEARING RESTRICTIONS: No  FALLS: Has patient fallen in last 6 months? No  LIVING ENVIRONMENT: Lives with: lives with their family Lives in: House/apartment Stairs: No Has following equipment at home: None  PLOF: Independent  PATIENT GOALS: reduce back pain  OBJECTIVE:   TODAY'S TREATMENT 01/24/23  TherEx  Nustep L5x6 minutes BLEs only  PPT x12 5 second holds PPT double bent knee lower x12 PPT + SLR x10 B Mini crunch x15 neutral Lspine Quadruped fire hydrants red TB 2x10 Quadruped hip extensions red TB 2x10 DLs 16# mod cues for form Good mornings with pool noodle x10 T hinges x10 B U UE support  Discussed/demonstrated suitcase marches      TODAY'S TREATMENT: 01/18/23 Activity Comments  HEP review -hip flex iso 10x -supine pelvic tilt  Cat-cow 10x   Quadruped hip  ext 10x   Quadruped arm ext 10x  Bird-Dog 10x   Deadlift 2x10 15#  Suitcase march 1x10 R/L 15#    PATIENT EDUCATION: Education details: assessment details. Postural support in sitting to improve lumbar lordosis support and discussion of lifting mechanics reduce risk for strain/injury Person educated: Patient Education method: Explanation Education comprehension: verbalized understanding  HOME EXERCISE PROGRAM: Access Code: [email or web address] URL: [email or web address] Date: 01/11/2023 Prepared by: Shary Decamp  Exercises - Supine Posterior Pelvic Tilt  - 1 x daily - 7 x weekly - 3 sets - 10 reps - 2 sec hold - Hooklying Isometric Hip Flexion  - 1 x daily - 7 x weekly - 3 sets - 10 reps - 2 sec hold - Cat Cow to Child's Pose  - 1 x daily - 7 x weekly - 1-3 sets - 10 reps - breathe hold - Bird Dog  - 1 x daily - 7 x weekly - 3 sets - 10 reps - Deadlift with Pelvic Contraction  - 1 x daily - 7 x weekly - 3 sets - 10 reps - Kettlebell Suitcase Carry  - 1 x daily - 7 x weekly - 3 sets - 10 reps  DIAGNOSTIC FINDINGS: IMPRESSION: 1. 7 mm anterolisthesis of L5 versus S1. Lower lumbar facet degenerative changes. 2. Moderate fecal loading in the colon.  COGNITION: Overall cognitive status: Within functional limits for tasks assessed   SENSATION: WFL  COORDINATION:   EDEMA:  none  MUSCLE TONE: WNL  MUSCLE LENGTH: Hamstrings WNL: SLR to 80 deg  DTRs:    POSTURE: No Significant postural limitations  LOWER EXTREMITY ROM:     Active  Right Eval Left Eval  Hip flexion    Hip extension    Hip abduction    Hip adduction    Hip internal rotation    Hip external rotation    Knee flexion    Knee extension    Ankle dorsiflexion    Ankle plantarflexion    Ankle inversion    Ankle eversion     (Blank rows = not tested)  Lumbar spine ROM: Flexion: to mid-shin Extension: WFL--increased pain Rotation: right > left Lateral Flexion: WNL--increased left  side  LOWER EXTREMITY MMT:    MMT Right Eval Left Eval  Hip flexion 4 4  Hip extension 3+ 3+  Hip abduction 4+ 4+  Hip adduction    Hip internal rotation    Hip external rotation    Knee flexion 5 5  Knee extension 5 5  Ankle dorsiflexion 5 5  Ankle plantarflexion    Ankle inversion    Ankle eversion    (Blank rows = not tested)   Trunk strength:  Flexion: 3+/5 Extension: 3-/5  PALPATION:  increased discomfort around PSIS and increased discomfort with posterior to anterior pressure around L5-S1   BED MOBILITY:  indep  TRANSFERS: Indep     STAIRS: NT  GAIT: Gait pattern: WFL Distance walked:  Assistive device utilized: None Level of assistance: Complete Independence Comments:         GOALS: Goals reviewed with patient? Yes  SHORT TERM GOALS: Target date: same ast LTG    LONG TERM GOALS: Target date: 02/08/2023    Patient will be independent in HEP to improve functional outcomes Baseline:  Goal status: INITIAL  2.  Improve trunk flexion strength to 4/5 and lumbar extension strength to 3+/5 to improve stability and reduce pain Baseline: 3+ and 3-/5 respectively Goal status: INITIAL  3.  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  Pt to teach-back strategies for postural alignment/mindfulness to reduce postural stress/pain Baseline:  Goal status: INITIAL    ASSESSMENT:  CLINICAL IMPRESSION:  Shantel arrives feeling much better, seems to be getting great benefit from existing POC. Continued with core strengthening and functional mechanics training such as deadlifts. Needs more practice with biomechanics form.   OBJECTIVE IMPAIRMENTS: decreased activity tolerance, decreased strength, improper body mechanics, postural dysfunction, and pain.   ACTIVITY LIMITATIONS: carrying, lifting, bending,  sitting, and squatting  PARTICIPATION LIMITATIONS: meal prep, cleaning, and occupation  PERSONAL FACTORS: Time since onset of injury/illness/exacerbation and 1 comorbidity: PMH  are also affecting patient's functional outcome.   REHAB POTENTIAL: Good  CLINICAL DECISION MAKING: Stable/uncomplicated  EVALUATION COMPLEXITY: Low  PLAN:  PT FREQUENCY: 1x/week  PT DURATION: 4 weeks  PLANNED INTERVENTIONS: Therapeutic exercises, Therapeutic activity, Neuromuscular re-education, Balance training, Gait training, Patient/Family education, Self Care, Joint mobilization, Vestibular training, Canalith repositioning, Aquatic Therapy, Dry Needling, Electrical stimulation, Spinal mobilization, Cryotherapy, Moist heat, and Manual therapy  PLAN FOR NEXT SESSION: HEP review, progress core stabilization exercises, lifting mechanics (deadlift for big items, forward-T for small items), postural awareness handout for improved symptoms in sitting, planks. Review DL and good morning form    Nedra Hai, PT, DPT 01/24/23 10:14 AM

## 2023-01-25 DIAGNOSIS — Z419 Encounter for procedure for purposes other than remedying health state, unspecified: Secondary | ICD-10-CM | POA: Diagnosis not present

## 2023-01-29 ENCOUNTER — Ambulatory Visit (INDEPENDENT_AMBULATORY_CARE_PROVIDER_SITE_OTHER): Payer: Medicaid Other | Admitting: Podiatry

## 2023-01-29 ENCOUNTER — Encounter: Payer: Self-pay | Admitting: Podiatry

## 2023-01-29 DIAGNOSIS — M21612 Bunion of left foot: Secondary | ICD-10-CM

## 2023-01-29 DIAGNOSIS — M21611 Bunion of right foot: Secondary | ICD-10-CM

## 2023-01-29 NOTE — Progress Notes (Signed)
Subjective:   Patient ID: Amanda White, female   DOB: 57 y.o.   MRN: [id number]   HPI Patient presents stating the big toe joint is really bothering her it did get mild relief with the injection but it is back pretty quickly   ROS      Objective:  Physical Exam  Vascular status intact negative Denna Haggard' sign noted first MPJ structural bunion deformity along with hallux limitus with reduced range of motion of the joint and pain still mostly on the lateral side of the joint surface     Assessment:  Hallux limitus rigidus deformity right that did not respond the way I was hoping to medication shoe gear modifications     Plan:  H&P reviewed and I do think that long-term surgery would be best for her and I explained surgical intervention in this case and distal osteotomy and the fact eventually could require fusion joint implantation.  Patient wants surgery after extensive review signed consent form all preoperative instructions given and patient is scheduled for this procedure.  I educated her on the procedure recovery with approximate 6 months of complete recovery

## 2023-01-31 ENCOUNTER — Ambulatory Visit (INDEPENDENT_AMBULATORY_CARE_PROVIDER_SITE_OTHER): Payer: Medicaid Other | Admitting: Internal Medicine

## 2023-01-31 ENCOUNTER — Encounter: Payer: Self-pay | Admitting: Internal Medicine

## 2023-01-31 VITALS — BP 100/68 | HR 82 | Temp 98.3°F | Ht 63.0 in | Wt 132.2 lb

## 2023-01-31 DIAGNOSIS — Q446 Cystic disease of liver: Secondary | ICD-10-CM

## 2023-01-31 DIAGNOSIS — B351 Tinea unguium: Secondary | ICD-10-CM

## 2023-01-31 DIAGNOSIS — I3139 Other pericardial effusion (noninflammatory): Secondary | ICD-10-CM | POA: Diagnosis not present

## 2023-01-31 DIAGNOSIS — K219 Gastro-esophageal reflux disease without esophagitis: Secondary | ICD-10-CM | POA: Diagnosis not present

## 2023-01-31 DIAGNOSIS — E785 Hyperlipidemia, unspecified: Secondary | ICD-10-CM | POA: Diagnosis not present

## 2023-01-31 DIAGNOSIS — Z87898 Personal history of other specified conditions: Secondary | ICD-10-CM

## 2023-01-31 DIAGNOSIS — R7989 Other specified abnormal findings of blood chemistry: Secondary | ICD-10-CM

## 2023-01-31 DIAGNOSIS — F411 Generalized anxiety disorder: Secondary | ICD-10-CM

## 2023-01-31 DIAGNOSIS — M19071 Primary osteoarthritis, right ankle and foot: Secondary | ICD-10-CM | POA: Diagnosis not present

## 2023-01-31 DIAGNOSIS — Z789 Other specified health status: Secondary | ICD-10-CM

## 2023-01-31 NOTE — Assessment & Plan Note (Signed)
Peptic Ulcer Disease Despite taking omeprazole 40mg  twice daily, they continue to experience persistent symptoms. An endoscopy revealed a deep cratering ulcer at the beginning of the esophagus and inflammation in the gastric antrum, with no evidence of malignancy. They are scheduled for a follow-up with GI, and we will continue omeprazole 40mg  twice daily. We encourage a follow-up with GI for a possible repeat endoscopy.

## 2023-01-31 NOTE — Assessment & Plan Note (Signed)
A pericardial effusion was identified on previous imaging without follow-up or evaluation by cardiology, and they have a family history of heart disease. We will order an echocardiogram to evaluate the pericardial effusion.

## 2023-01-31 NOTE — Progress Notes (Signed)
[facility]: [phone]   Routine Medical Office Visit  Patient:  Amanda White      Age: 57 y.o.       Sex:  female  Date:   01/31/2023 Patient Care Team: Lula Olszewski, MD as PCP - General (Internal Medicine) Jodelle Red, MD as PCP - Cardiology (Cardiology) Fritzi Mandes, MD as Consulting Physician (General Surgery) Autry-Lott, Randa Evens, DO as Resident (Family Medicine) Altamese Benson, MD as Consulting Physician (Endocrinology) Sanjuana Kava, NP as Nurse Practitioner (Psychiatry) Today's Healthcare Provider: Lula Olszewski, MD   Assessment and Plan:   Statin intolerance  LFT elevation  Pericardial effusion Assessment & Plan: A pericardial effusion was identified on previous imaging without follow-up or evaluation by cardiology, and they have a family history of heart disease. We will order an echocardiogram to evaluate the pericardial effusion.  Orders: -     ECHOCARDIOGRAM COMPLETE; Future  Arthritis of first metatarsophalangeal (MTP) joint of right foot Assessment & Plan: Bunion and Arthritis They are scheduled for surgery on 02/20/2023 but express hesitation due to fear of dependency during recovery. We encourage them to discuss their concerns with their surgeon.   Cystic disease of liver  Generalized anxiety disorder Assessment & Plan: Mental Health They report high stress levels and do not feel their current medication, carbamazepine, is effective for mood stabilization and are seeking therapy. We encourage them to discuss medication effectiveness and therapy options with their psychiatrist.   Gastroesophageal reflux disease without esophagitis Assessment & Plan: Peptic Ulcer Disease Despite taking omeprazole 40mg  twice daily, they continue to experience persistent symptoms. An endoscopy revealed a deep cratering ulcer at the beginning of the esophagus and inflammation in the gastric antrum, with no evidence of  malignancy. They are scheduled for a follow-up with GI, and we will continue omeprazole 40mg  twice daily. We encourage a follow-up with GI for a possible repeat endoscopy.   History of weight loss Assessment & Plan: Stabilized but continue to monitor. Suspect(s) due to peptic ulcer disease but pericardial effusion and LAD keep me suspicious for alternative cause   Hyperlipidemia, unspecified hyperlipidemia type Assessment & Plan: Encouraged patient to continue(s) with rosuvastatin 10, as per Cardiologist Jodelle Red, MD    Onychomycosis Assessment & Plan: They show slow improvement with cyclopirox, remaining compliant with treatment. We will continue cyclopirox and reassure them that treatment may take up to a year.        General Health Maintenance We will continue to monitor liver function tests and AFP, noting the previous AFP was minimally elevated, but MRI confirmed a benign liver cyst. We will also continue to monitor for unintentional weight loss, which has been improving over the past few months and is likely related to peptic ulcer disease. We will continue acetaminophen for pain as needed, using Celebrex in moderately severe situations. Follow-up is scheduled in 1 month.      Orders          Ordered    ECHOCARDIOGRAM COMPLETE       Comments: cardiologist is Dr. Jodelle Red, complete in accordance with her preferences and share results to her   01/31/23 1008           Future Appointments  Date Time Provider Department Center  02/01/2023 10:15 AM Anette Guarneri D, PT [facility]  02/08/2023 10:15 AM Dion Body, PT [facility]  02/28/2023  2:45 PM Regal, Kirstie Peri, DPM [facility]  03/05/2023  9:20 AM Lula Olszewski,  MD [facility]  03/14/2023 11:15 AM Lenn Sink, DPM [facility]  03/20/2023  3:00 PM Nwoko, Tommas Olp, PA [facility] None  04/13/2023  9:50 AM [facility]           Clinical  Presentation:    57 y.o. female who has Hyperlipidemia; Benzodiazepine dependence (HCC); GERD (gastroesophageal reflux disease); Lumbago of lumbar region with sciatica; Podagra; Hypertension; Generalized anxiety disorder; Memory impairment; Subcutaneous nodule; RUQ abdominal tenderness; Cystic disease of liver; Dizziness; Right-sided chest pain; Cyst of gallbladder; Aortic atherosclerosis (HCC); Abnormal cardiac CT angiography; History of colon polyps; DDD (degenerative disc disease), thoracolumbar; Moderate episode of recurrent major depressive disorder (HCC); Long-term current use of benzodiazepine; Stress ulcer of stomach; Fecal retention; High gamma glutamyl transferase (GGT); Arthritis of first metatarsophalangeal (MTP) joint of right foot; Onychomycosis; Family history of rheumatoid arthritis; History of weight loss; Hallux limitus of right foot; Statin intolerance; and Pericardial effusion on their problem list. Her reasons/main concerns/chief complaints for today's office visit are One month follow-up, Medical Management of Chronic Issues, and Weight Loss (Wt Readings from Last 10 Encounters:/01/31/23 : 132 lb 3.2 oz (60 kg)/12/25/22 : 129 lb 6.4 oz (58.7 kg)/12/05/22 : 128 lb 1.6 oz (58.1 kg)/11/28/22 : 125 lb 6.4 oz (56.9 kg)/11/24/22 : 125 lb 6.4 oz (56.9 kg)/11/16/22 : 125 lb 11.2 oz (57 kg)/10/25/22 : 125 lb 6.4 oz (56.9 kg)/10/09/22 : 122 lb (55.3 kg)/09/25/22 : 129 lb 3.2 oz (58.6 kg)/04/18/21 : 175 lb 11.3 oz (79.7 kg)//)   AI-Extracted: Discussed the use of AI scribe software for clinical note transcription with the patient, who gave verbal consent to proceed.  History of Present Illness   The patient, with a history of stress ulcer, liver cyst, and foot pain due to a bunion and arthritis, presents with concerns about weight fluctuations and persistent heartburn. They report a pattern of weight gain in the morning after consuming coffee, which they believe subsequently decreases throughout  the day. Despite regular walking and strength training exercises for back therapy, they have gained six pounds over the past few months.  The patient is also scheduled for a bunion surgery, which they are still contemplating due to concerns about post-operative dependency. They report no new medications, and are currently on carbamazepine for mood stabilization, which they feel is not effectively managing their stress levels.  The patient has been experiencing persistent heartburn despite being on omeprazole 40 mg twice a day for the past three to four months. They report that the heartburn is occasionally relieved by water or soda, which they no longer consume. They also report that they are on two other medications for their stomach condition, including Famotidine.  The patient has been experiencing body aches, which they attribute to their previous medication, atorvastatin. Since switching to rosuvastatin, they report a significant reduction in these symptoms. They also report a family history of heart disease.  The patient has been referred for an echocardiogram due to a pericardial effusion detected on a previous CT scan. They report no knowledge of an echocardiogram being performed. They also report no vomiting or blood in their stool. They deny taking any NSAIDs, ibuprofen, or Aleve, and only take Tylenol for pain. They have been advised to take Celebrex for severe pain, but have been hesitant due to a belief that it contains aspirin.        She  has a past medical history of Abnormal cardiac CT angiography (10/12/2022), Anxiety, Bipolar 1 disorder (HCC), Carpal tunnel  syndrome (09/25/2022), DDD (degenerative disc disease), thoracolumbar (10/25/2022), Depression, Depression, Dizziness (10/09/2022), GERD (gastroesophageal reflux disease), History of weight loss (12/25/2022), Hypertension, LFT elevation (12/25/2022), Migraine, Pressure ulcer (11/28/2022), Subcutaneous nodule (09/25/2022), and  Unintentional weight loss (09/25/2022). Current Outpatient Medications on File Prior to Visit  Medication Sig   acetaminophen (TYLENOL) 325 MG tablet Take 650 mg by mouth every 6 (six) hours as needed.   carbamazepine (TEGRETOL) 100 MG chewable tablet Chew 1 tablet (100 mg total) by mouth 2 (two) times daily.   celecoxib (CELEBREX) 200 MG capsule Take 1 capsule (200 mg total) by mouth 2 (two) times daily.   ciclopirox (LOPROX) 0.77 % cream Apply topically 2 (two) times daily.   [START ON 02/07/2023] clonazePAM (KLONOPIN) 2 MG tablet Take 1 tablet (2 mg total) by mouth at bedtime.   cyclobenzaprine (FLEXERIL) 10 MG tablet Take 1 tablet (10 mg total) by mouth daily as needed for muscle spasms.   diclofenac Sodium (VOLTAREN) 1 % GEL Apply 4 g topically 4 (four) times daily as needed.   famotidine (PEPCID) 20 MG tablet Take 20 mg by mouth 2 (two) times daily.   famotidine-calcium carbonate-magnesium hydroxide (PEPCID COMPLETE) 10-800-165 MG chewable tablet Chew 1 tablet by mouth daily as needed.   gabapentin (NEURONTIN) 300 MG capsule Take 1 capsule (300 mg total) by mouth 3 (three) times daily.   lisinopril-hydrochlorothiazide (PRINZIDE,ZESTORETIC) 10-12.5 MG tablet Take 1 tablet by mouth daily.   mirtazapine (REMERON) 7.5 MG tablet TAKE 1 TABLET BY MOUTH AT BEDTIME.   omeprazole (PRILOSEC) 40 MG capsule Take 40 mg by mouth 2 (two) times daily.   ondansetron (ZOFRAN ODT) 4 MG disintegrating tablet Take 1 tablet (4 mg total) by mouth every 8 (eight) hours as needed for nausea or vomiting.   rosuvastatin (CRESTOR) 10 MG tablet Take 1 tablet (10 mg total) by mouth daily for 30 days, THEN 2 tablets (20 mg total) daily.   sucralfate (CARAFATE) 1 g tablet Take 1 g by mouth 3 (three) times daily.   No current facility-administered medications on file prior to visit.   Medications Discontinued During This Encounter  Medication Reason   omeprazole (PRILOSEC) 20 MG capsule    atorvastatin (LIPITOR) 40 MG  tablet Completed Course         Clinical Data Analysis:   Physical Exam  BP 100/68 (BP Location: Left Arm, Patient Position: Sitting)   Pulse 82   Temp 98.3 F (36.8 C) (Temporal)   Ht 5\' 3"  (1.6 m)   Wt 132 lb 3.2 oz (60 kg)   LMP 12/20/2011   SpO2 98%   BMI 23.42 kg/m  Wt Readings from Last 10 Encounters:  01/31/23 132 lb 3.2 oz (60 kg)  12/25/22 129 lb 6.4 oz (58.7 kg)  12/05/22 128 lb 1.6 oz (58.1 kg)  11/28/22 125 lb 6.4 oz (56.9 kg)  11/24/22 125 lb 6.4 oz (56.9 kg)  11/16/22 125 lb 11.2 oz (57 kg)  10/25/22 125 lb 6.4 oz (56.9 kg)  10/09/22 122 lb (55.3 kg)  09/25/22 129 lb 3.2 oz (58.6 kg)  04/18/21 175 lb 11.3 oz (79.7 kg)   Vital signs reviewed.  Nursing notes reviewed. Weight trend reviewed. Abnormalities and Problem-Specific physical exam findings:   mild truncal adiposity, mild weight gain noted General Appearance:  No acute distress appreciable.   Well-groomed, healthy-appearing female.  Well proportioned with no abnormal fat distribution.  Good muscle tone. Skin: Clear and well-hydrated. Pulmonary:  Normal work of breathing at rest, no respiratory distress  apparent. SpO2: 98 %  Musculoskeletal: All extremities are intact.  Neurological:  Awake, alert, oriented, and engaged.  No obvious focal neurological deficits or cognitive impairments.  Sensorium seems unclouded.   Speech is clear and coherent with logical content. Psychiatric:  Appropriate mood, pleasant and cooperative demeanor, thoughtful and engaged during the exam  Results Reviewed:     No results found for any visits on 01/31/23.  Office Visit on 12/25/2022  Component Date Value   Uric Acid, Serum 12/25/2022 3.8    Sed Rate 12/25/2022 11    CRP 12/25/2022 <1.0    Sodium 12/25/2022 140    Potassium 12/25/2022 4.6    Chloride 12/25/2022 99    CO2 12/25/2022 34 (H)    Glucose, Bld 12/25/2022 94    BUN 12/25/2022 14    Creatinine, Ser 12/25/2022 0.65    Total Bilirubin 12/25/2022 0.4     Alkaline Phosphatase 12/25/2022 50    AST 12/25/2022 25    ALT 12/25/2022 28    Total Protein 12/25/2022 7.4    Albumin 12/25/2022 4.8    GFR 12/25/2022 97.31    Calcium 12/25/2022 10.7 (H)    AFP-Tumor Marker 12/25/2022 9.6 (H)    Cortisol, Plasma 12/25/2022 9.4    C206 ACTH 12/25/2022 14   Office Visit on 11/28/2022  Component Date Value   H pylori Ag, Stl 11/30/2022 Negative   Lab on 11/21/2022  Component Date Value   Sodium 11/21/2022 136    Potassium 11/21/2022 4.1    Chloride 11/21/2022 97    CO2 11/21/2022 30    Glucose, Bld 11/21/2022 93    BUN 11/21/2022 15    Creatinine, Ser 11/21/2022 0.72    GFR 11/21/2022 92.47    Calcium 11/21/2022 9.9    TSH 11/21/2022 1.31    Free T4 11/21/2022 0.76   Office Visit on 11/16/2022  Component Date Value   High risk HPV 11/16/2022 Negative    Adequacy 11/16/2022 Satisfactory for evaluation; transformation zone component PRESENT.    Diagnosis 11/16/2022 - Negative for intraepithelial lesion or malignancy (NILM)    Comment 11/16/2022 Atrophic changes are present.    Comment 11/16/2022 Normal Reference Range HPV - Negative    Glucose, UA 11/16/2022 NEGATIVE    Bilirubin Urine 11/16/2022 NEGATIVE    Ketones, ur 11/16/2022 NEGATIVE    Specific Gravity, Urine 11/16/2022 1.010    Hgb urine dipstick 11/16/2022 NEGATIVE    pH 11/16/2022 7.0    Protein, ur 11/16/2022 NEGATIVE    Urobilinogen, UA 11/16/2022 0.2    Nitrite 11/16/2022 NEGATIVE    Leukocytes,Ua 11/16/2022 NEGATIVE   Abstract on 11/08/2022  Component Date Value   HM Colonoscopy 05/10/2021 See Report (in chart)   Scanned Document on 10/11/2022  Component Date Value   HM Colonoscopy 05/25/2021 See Report (in chart)   Office Visit on 09/25/2022  Component Date Value   Hepatitis C Ab 09/25/2022 NON-REACTIVE    HIV 1&2 Ab, 4th Generati* 09/25/2022 NON-REACTIVE    Cholesterol 09/25/2022 221 (H)    Triglycerides 09/25/2022 102.0    HDL 09/25/2022 76.50    VLDL 09/25/2022  20.4    LDL Cholesterol 09/25/2022 124 (H)    Total CHOL/HDL Ratio 09/25/2022 3    NonHDL 09/25/2022 144.80    WBC 09/25/2022 7.2    RBC 09/25/2022 3.96    Platelets 09/25/2022 423.0 (H)    Hemoglobin 09/25/2022 13.0    HCT 09/25/2022 37.1    MCV 09/25/2022 93.8    MCHC 09/25/2022 34.9  RDW 09/25/2022 13.0    Sodium 09/25/2022 138    Potassium 09/25/2022 3.6    Chloride 09/25/2022 98    CO2 09/25/2022 30    Glucose, Bld 09/25/2022 96    BUN 09/25/2022 17    Creatinine, Ser 09/25/2022 0.64    Total Bilirubin 09/25/2022 0.7    Alkaline Phosphatase 09/25/2022 47    AST 09/25/2022 40 (H)    ALT 09/25/2022 99 (H)    Total Protein 09/25/2022 7.2    Albumin 09/25/2022 4.9    GFR 09/25/2022 97.85    Calcium 09/25/2022 10.1    Cyclic Citrullin Peptide* 09/25/2022 <16    Rheumatoid fact SerPl-aC* 09/25/2022 <14    Anti Nuclear Antibody (A* 09/25/2022 NEGATIVE    VITD 09/25/2022 26.68 (L)    Vitamin B-12 09/25/2022 306    Folate 09/25/2022 11.2    ANCA SCREEN 09/25/2022 Negative    T3 Uptake 09/25/2022 33    T4, Total 09/25/2022 7.7    Free Thyroxine Index 09/25/2022 2.5    TSH 09/25/2022 0.63    No image results found.   DG Foot 2 Views Left  Result Date: 01/01/2023 Please see detailed radiograph report in office note.  DG Foot 2 Views Right  Result Date: 01/01/2023 Please see detailed radiograph report in office note.  DG Foot Complete Right  Result Date: 12/30/2022 CLINICAL DATA:  Right metatarsophalangeal pain mainly near great toe metatarsophalangeal joint for 1 year. Intermittent and worsening last 2 months. EXAM: RIGHT FOOT COMPLETE - 3+ VIEW COMPARISON:  None Available. FINDINGS: Mild hallux valgus. Severe lateral great toe metatarsophalangeal joint space narrowing bone-on-bone contact, subchondral sclerosis, and mild-to-moderate lateral and dorsal degenerative osteophytosis. Mild first tarsometatarsal lateral joint space narrowing and dorsal and lateral degenerative  osteophytes. No acute fracture or dislocation. IMPRESSION: 1. Mild hallux valgus. Severe great toe metatarsophalangeal joint osteoarthritis. 2. Mild first tarsometatarsal osteoarthritis. Electronically Signed   By: Neita Garnet M.D.   On: 12/30/2022 14:24   DG Lumbar Spine Complete  Result Date: 12/08/2022 CLINICAL DATA:  Pain EXAM: LUMBAR SPINE - COMPLETE 4+ VIEW COMPARISON:  None Available. FINDINGS: 7 mm anterolisthesis of L5 versus S1. No other malalignment. Lower lumbar facet degenerative changes. Moderate fecal loading in the colon. No other abnormalities. IMPRESSION: 1. 7 mm anterolisthesis of L5 versus S1. Lower lumbar facet degenerative changes. 2. Moderate fecal loading in the colon. Electronically Signed   By: Gerome Sam III M.D.   On: 12/08/2022 08:52    DG Foot Complete Right  Result Date: 12/30/2022 CLINICAL DATA:  Right metatarsophalangeal pain mainly near great toe metatarsophalangeal joint for 1 year. Intermittent and worsening last 2 months. EXAM: RIGHT FOOT COMPLETE - 3+ VIEW COMPARISON:  None Available. FINDINGS: Mild hallux valgus. Severe lateral great toe metatarsophalangeal joint space narrowing bone-on-bone contact, subchondral sclerosis, and mild-to-moderate lateral and dorsal degenerative osteophytosis. Mild first tarsometatarsal lateral joint space narrowing and dorsal and lateral degenerative osteophytes. No acute fracture or dislocation. IMPRESSION: 1. Mild hallux valgus. Severe great toe metatarsophalangeal joint osteoarthritis. 2. Mild first tarsometatarsal osteoarthritis. Electronically Signed   By: Neita Garnet M.D.   On: 12/30/2022 14:24      This encounter employed real-time, collaborative documentation. The patient actively reviewed and updated their medical record on a shared screen, ensuring transparency and facilitating joint problem-solving for the problem list, overview, and plan. This approach promotes accurate, informed care. The treatment plan was discussed  and reviewed in detail, including medication safety, potential side effects, and all patient questions.  We confirmed understanding and comfort with the plan. Follow-up instructions were established, including contacting the office for any concerns, returning if symptoms worsen, persist, or new symptoms develop, and precautions for potential emergency department visits. ----------------------------------------------------- Lula Olszewski, MD  01/31/2023 10:14 AM  [facility] Office:  [phone]

## 2023-01-31 NOTE — Assessment & Plan Note (Signed)
Stabilized but continue to monitor. Suspect(s) due to peptic ulcer disease but pericardial effusion and LAD keep me suspicious for alternative cause

## 2023-01-31 NOTE — Assessment & Plan Note (Signed)
Bunion and Arthritis They are scheduled for surgery on 02/20/2023 but express hesitation due to fear of dependency during recovery. We encourage them to discuss their concerns with their surgeon.

## 2023-01-31 NOTE — Assessment & Plan Note (Signed)
They show slow improvement with cyclopirox, remaining compliant with treatment. We will continue cyclopirox and reassure them that treatment may take up to a year.

## 2023-01-31 NOTE — Assessment & Plan Note (Signed)
They experienced myalgias with atorvastatin, but symptoms resolved after switching to rosuvastatin 10mg . We will continue rosuvastatin 10mg  and document statin intolerance.

## 2023-01-31 NOTE — Patient Instructions (Signed)
VISIT SUMMARY:  During your visit, we discussed your concerns about weight fluctuations, persistent heartburn, and your upcoming bunion and arthritis surgery. We also reviewed your liver health, fungal toenail infection, and recent change in statin medication. Lastly, we touched on your ongoing stress management.  YOUR PLAN:  -BUNION AND ARTHRITIS: You are scheduled for surgery, but you have concerns about the recovery period. We will continue your current pain medications and require clearance from your heart doctor before the surgery.  -GASTRIC ULCER: Despite your current medication, you still experience heartburn. We will continue your current medication and refer you to a stomach specialist for a possible repeat procedure to look at your esophagus, stomach, and the first part of your small intestine.  -ABNORMAL LIVER FUNCTION TESTS: Your liver tests showed a slightly elevated marker, but an MRI confirmed it's a benign cyst. We will continue to monitor your liver function.  -UNINTENTIONAL WEIGHT LOSS: You've gained some weight over the last few months, which is likely related to your stomach ulcer. We will continue to monitor your weight.  -FUNGAL TOENAIL INFECTION: You're using a medication for your toenail infection, which is slowly improving. We will continue this medication until your toenail fully grows out.  -STATIN INTOLERANCE: You switched your cholesterol medication due to muscle aches and cramps, and you've seen improvement. We will continue your current medication.  -PERICARDIAL EFFUSION: Previous imaging showed fluid around your heart with an unknown cause. We will order a heart ultrasound to further evaluate this.  -MOOD INSTABILITY: You're experiencing high stress and are not satisfied with your current mood stabilizer. We will continue your current medication and encourage you to follow up with your psychiatrist for possible medication adjustment.  INSTRUCTIONS:  Please  follow up in 1 month to review the results of your heart ultrasound and continue addressing your health concerns.

## 2023-01-31 NOTE — Assessment & Plan Note (Signed)
Mental Health They report high stress levels and do not feel their current medication, carbamazepine, is effective for mood stabilization and are seeking therapy. We encourage them to discuss medication effectiveness and therapy options with their psychiatrist.

## 2023-01-31 NOTE — Assessment & Plan Note (Signed)
Encouraged patient to continue(s) with rosuvastatin 10, as per Cardiologist Jodelle Red, MD

## 2023-02-01 ENCOUNTER — Ambulatory Visit: Payer: Medicaid Other | Admitting: Physical Therapy

## 2023-02-02 ENCOUNTER — Encounter: Payer: Self-pay | Admitting: Family Medicine

## 2023-02-02 ENCOUNTER — Telehealth: Payer: Self-pay | Admitting: Urology

## 2023-02-02 ENCOUNTER — Ambulatory Visit (INDEPENDENT_AMBULATORY_CARE_PROVIDER_SITE_OTHER): Payer: Medicaid Other | Admitting: Family Medicine

## 2023-02-02 VITALS — BP 100/70 | HR 76 | Temp 98.4°F | Resp 18 | Ht 63.0 in | Wt 129.4 lb

## 2023-02-02 DIAGNOSIS — Z8711 Personal history of peptic ulcer disease: Secondary | ICD-10-CM

## 2023-02-02 DIAGNOSIS — K259 Gastric ulcer, unspecified as acute or chronic, without hemorrhage or perforation: Secondary | ICD-10-CM | POA: Diagnosis not present

## 2023-02-02 DIAGNOSIS — K219 Gastro-esophageal reflux disease without esophagitis: Secondary | ICD-10-CM

## 2023-02-02 MED ORDER — HYOSCYAMINE SULFATE 0.125 MG SL SUBL
0.2500 mg | SUBLINGUAL_TABLET | Freq: Once | SUBLINGUAL | Status: AC
Start: 2023-02-02 — End: 2023-02-02
  Administered 2023-02-02: 0.25 mg via SUBLINGUAL

## 2023-02-02 MED ORDER — LIDOCAINE VISCOUS HCL 2 % MT SOLN
15.0000 mL | Freq: Once | OROMUCOSAL | Status: AC
Start: 2023-02-02 — End: 2023-02-02
  Administered 2023-02-02: 15 mL via OROMUCOSAL

## 2023-02-02 MED ORDER — PANTOPRAZOLE SODIUM 40 MG PO TBEC: 40.0000 mg | DELAYED_RELEASE_TABLET | Freq: Two times a day (BID) | ORAL | 3 refills | Status: DC

## 2023-02-02 MED ORDER — ALUM & MAG HYDROXIDE-SIMETH 200-200-20 MG/5ML PO SUSP
30.0000 mL | Freq: Once | ORAL | Status: AC
Start: 2023-02-02 — End: 2023-02-02
  Administered 2023-02-02: 30 mL via ORAL

## 2023-02-02 NOTE — Progress Notes (Signed)
Established Patient Office Visit  Subjective   Patient ID: Amanda White, female    DOB: 1965-10-31  Age: 57 y.o. MRN: [id number]  Chief Complaint  Patient presents with   Nausea    Pt states waking up this morning and states having nausea and having upper abdominal pain near her breast. Pt states having a burning sensation.     HPI Discussed the use of AI scribe software for clinical note transcription with the patient, who gave verbal consent to proceed.  History of Present Illness   The patient, with a known history of peptic ulcer disease, presents with a persistent burning sensation despite being on triple therapy with omeprazole, famotidine, and sucralfate. The patient reports that the burning sensation is not associated with nausea or vomiting, but describes it as a rising sensation that she can feel in her mouth. The patient has undergone two endoscopies, both of which confirmed the presence of the ulcer.  In addition to the ulcer, the patient also reports significant anxiety, which she believes may be contributing to her symptoms. The patient describes waking up feeling as though she couldn't breathe and having a sensation of something on her chest. She is currently on medication for anxiety.  The patient also reports a significant weight loss of 56 pounds over a short period of time. This weight loss was investigated with multiple tests, including CT scans and MRIs, but no clear cause was identified. The patient's weight loss has since stabilized, and she is currently maintaining her weight with a diet of smoothies and fresh foods.  The patient also mentions a diagnosis of atherosclerosis, but states that her cardiologist was not overly concerned about this. She is currently awaiting an echocardiogram to investigate possible fluid buildup around the heart.      Patient Active Problem List   Diagnosis Date Noted   History of peptic ulcer 02/02/2023   Statin intolerance  01/31/2023   Pericardial effusion 01/31/2023   Hallux limitus of right foot 01/02/2023   High gamma glutamyl transferase (GGT) 12/25/2022   Arthritis of first metatarsophalangeal (MTP) joint of right foot 12/25/2022   Onychomycosis 12/25/2022   Family history of rheumatoid arthritis 12/25/2022   History of weight loss 12/25/2022   Fecal retention 12/08/2022   Stress ulcer of stomach 11/28/2022   Moderate episode of recurrent major depressive disorder (HCC) 10/26/2022   Long-term current use of benzodiazepine 10/26/2022   DDD (degenerative disc disease), thoracolumbar 10/25/2022   Aortic atherosclerosis (HCC) 10/12/2022   Abnormal cardiac CT angiography 10/12/2022   History of colon polyps 10/12/2022   Dizziness 10/09/2022   Right-sided chest pain 10/09/2022   Cyst of gallbladder 10/09/2022   Hyperlipidemia 09/25/2022   Benzodiazepine dependence (HCC) 09/25/2022   GERD (gastroesophageal reflux disease) 09/25/2022   Lumbago of lumbar region with sciatica 09/25/2022   Podagra 09/25/2022   Hypertension 09/25/2022   Generalized anxiety disorder 09/25/2022   Memory impairment 09/25/2022   Subcutaneous nodule 09/25/2022   RUQ abdominal tenderness 09/25/2022   Cystic disease of liver 09/25/2022   Past Medical History:  Diagnosis Date   Abnormal cardiac CT angiography 10/12/2022   Small intermediate density pericardial effusion versus thickening. Consider further evaluation with echocardiography.   Anxiety    Bipolar 1 disorder (HCC)    Carpal tunnel syndrome 09/25/2022   R sided release 2022, all better now.   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   Depression    Depression    Dizziness 10/09/2022   Saw emergency room 10/03/22   GERD  (gastroesophageal reflux disease)    History of weight loss 12/25/2022   Hypertension    LFT elevation 12/25/2022   Lab Results      Component    Value    Date/Time           ALT    28    12/25/2022 10:31 AM           ALT    99 (H)    09/25/2022 09:53 AM           ALT    22    06/22/2020 01:26 PM           ALT    28    05/19/2018 09:51 PM           ALT    13    01/03/2012 09:55 AM             Lab Results      Component    Value    Date/Time           AST    25    12/25/2022 10:31 AM           AST    40 (H)    0   Migraine    Pressure ulcer 11/28/2022   Subcutaneous nodule 09/25/2022   Unintentional weight loss 09/25/2022   November 28, 2022 interim history:   Denies any heartburn, prior abdomen/flank pain gone, just some right upper chest pain now, gastrointestinal did endoscopy and found ulcer but we don't have report.  Off advil. Weight stable. Continuing remeron         Wt Readings from Last 5 Encounters:  11/28/22  125 lb 6.4 oz (56.9 kg)  11/24/22  125 lb 6.4 oz (56.9 kg)  11/16/22  125 lb 11.2 oz (57 kg)  10/25/22    Past Surgical History:  Procedure Laterality Date   CARPAL TUNNEL RELEASE Right 04/18/2021   Procedure: RIGHT ENDOSCOPIC CARPAL TUNNEL RELEASE;  Surgeon: Mack Hook, MD;  Location: [facility];  Service: Orthopedics;  Laterality: Right;  LENTH OF SURGERY: 30 MINUTES   DILATION AND CURETTAGE OF UTERUS     Social History   Tobacco Use   Smoking status: Never   Smokeless tobacco: Never  Substance Use Topics   Alcohol use: No    Comment: occas.   Drug use: No   Social History   Socioeconomic History   Marital status: Married    Spouse name: Not on file   Number of children: Not on file   Years of education: Not on file   Highest education level: Bachelor's degree (e.g., BA, AB, BS)  Occupational History   Not on file  Tobacco Use   Smoking status: Never   Smokeless tobacco: Never  Substance and Sexual Activity   Alcohol use: No    Comment: occas.    Drug use: No   Sexual activity: Not on file  Other Topics Concern   Not on file  Social History Narrative   Not on file   Social Determinants of Health   Financial Resource Strain: Medium Risk (11/27/2022)   Overall Financial Resource Strain (CARDIA)    Difficulty  of Paying Living Expenses: Somewhat hard  Food Insecurity: No Food Insecurity (11/27/2022)   Hunger Vital Sign    Worried About Running Out of Food in the Last Year: Never true    Ran Out of Food in the Last Year: Never true  Transportation Needs: No Transportation Needs (11/27/2022)   PRAPARE - Administrator, Civil Service (Medical): No    Lack of Transportation (Non-Medical): No  Physical Activity: Sufficiently Active (11/27/2022)   Exercise Vital Sign    Days of Exercise per Week: 7 days    Minutes of Exercise per Session: 40 min  Stress: Stress Concern Present (11/27/2022)   [employer] of Occupational Health - Occupational Stress Questionnaire    Feeling of Stress : Very much  Social Connections: Unknown (11/27/2022)   Social Connection and Isolation Panel [NHANES]    Frequency of Communication with Friends and Family: Once a week    Frequency of Social Gatherings with Friends and Family: Never    Attends Religious Services: Patient declined    Database administrator or Organizations: No    Attends Engineer, structural: Not on file    Marital Status: Married  Catering manager Violence: Not At Risk (10/03/2022)   Received from [employer], [facility]   HITS    Over the last 12 months how often did your partner physically hurt you?: 1    Over the last 12 months how often did your partner insult you or talk down to you?: 1    Over the last 12 months how often did your partner threaten you with physical harm?: 1    Over the last 12 months how often did your partner scream or curse at you?: 1   Family Status  Relation Name Status   Mother  Deceased   Father  Deceased  No partnership data  on file   Family History  Problem Relation Age of Onset   Hypertension Mother    Stroke Father    No Known Allergies    ROS    Objective:     BP 100/70 (BP Location: Left Arm, Patient Position: Sitting, Cuff Size: Normal)   Pulse 76   Temp 98.4 F (36.9 C) (Oral)   Resp 18   Ht 5\' 3"  (1.6 m)   Wt 129 lb 6.4 oz (58.7 kg)   LMP 12/20/2011   SpO2 99%   BMI 22.92 kg/m  BP Readings from Last 3 Encounters:  02/02/23 100/70  01/31/23 100/68  12/25/22 114/80   Wt Readings from Last 3 Encounters:  02/02/23 129 lb 6.4 oz (58.7 kg)  01/31/23 132 lb 3.2 oz (60 kg)  12/25/22 129 lb 6.4 oz (58.7 kg)   SpO2 Readings from Last 3 Encounters:  02/02/23 99%  01/31/23 98%  12/25/22 100%      Physical Exam Vitals and nursing note reviewed.  Constitutional:      General: She is not in acute distress.    Appearance: Normal appearance. She is well-developed.  HENT:     Head: Normocephalic and atraumatic.  Eyes:     General: No scleral icterus.       Right eye: No discharge.        Left eye: No discharge.  Cardiovascular:     Rate and Rhythm: Normal rate and regular rhythm.     Heart sounds: No murmur heard. Pulmonary:     Effort: Pulmonary effort is normal. No respiratory distress.     Breath  sounds: Normal breath sounds.  Abdominal:     Tenderness: There is abdominal tenderness in the epigastric area. There is guarding.  Musculoskeletal:        General: Normal range of motion.     Cervical back: Normal range of motion and neck supple.     Right lower leg: No edema.     Left lower leg: No edema.  Skin:    General: Skin is warm and dry.  Neurological:     Mental Status: She is alert and oriented to person, place, and time.  Psychiatric:        Mood and Affect: Mood normal.        Behavior: Behavior normal.        Thought Content: Thought content normal.        Judgment: Judgment normal.      No results found for any visits on 02/02/23.  Last CBC Lab Results   Component Value Date   WBC 7.2 09/25/2022   HGB 13.0 09/25/2022   HCT 37.1 09/25/2022   MCV 93.8 09/25/2022   MCH 32.2 06/22/2020   RDW 13.0 09/25/2022   PLT 423.0 (H) 09/25/2022   Last metabolic panel Lab Results  Component Value Date   GLUCOSE 94 12/25/2022   NA 140 12/25/2022   K 4.6 12/25/2022   CL 99 12/25/2022   CO2 34 (H) 12/25/2022   BUN 14 12/25/2022   CREATININE 0.65 12/25/2022   GFR 97.31 12/25/2022   CALCIUM 10.7 (H) 12/25/2022   PROT 7.4 12/25/2022   ALBUMIN 4.8 12/25/2022   BILITOT 0.4 12/25/2022   ALKPHOS 50 12/25/2022   AST 25 12/25/2022   ALT 28 12/25/2022   ANIONGAP 5 04/12/2021   Last lipids Lab Results  Component Value Date   CHOL 221 (H) 09/25/2022   HDL 76.50 09/25/2022   LDLCALC 124 (H) 09/25/2022   TRIG 102.0 09/25/2022   CHOLHDL 3 09/25/2022   Last hemoglobin A1c No results found for: "HGBA1C" Last thyroid functions Lab Results  Component Value Date   TSH 1.31 11/21/2022   T4TOTAL 7.7 09/25/2022   Last vitamin D Lab Results  Component Value Date   VD25OH 26.68 (L) 09/25/2022   Last vitamin B12 and Folate Lab Results  Component Value Date   VITAMINB12 306 09/25/2022   FOLATE 11.2 09/25/2022      The 10-year ASCVD risk score (Arnett DK, et al., 2019) is: 1.9%    Assessment & Plan:   Problem List Items Addressed This Visit       Unprioritized   GERD (gastroesophageal reflux disease) - Primary   Relevant Medications   pantoprazole (PROTONIX) 40 MG tablet   Stress ulcer of stomach    Pt needs f/u gi Gi cocktail resolved pain       History of peptic ulcer   Assessment and Plan    Gastric Ulcer Persistent despite treatment with omeprazole, famotidine, and sucralfate. Patient reports burning sensation. -Administer GI cocktail for immediate relief. -Change omeprazole to Protonix twice daily. -Continue follow-up with Dr. Gwendalyn Ege on August 21st.  Anxiety Patient reports high levels of stress and anxiety, contributing  to physical symptoms. Currently on medication for anxiety. -Continue current anxiety medication regimen.  Pericardial Effusion Patient reports shortness of breath and fatigue. Fluid buildup around the heart identified by Dr. Jon Billings. -Await echocardiogram ordered by Dr. Jon Billings to be performed at [facility].  General Health Maintenance -Continue current allergy medication regimen. -Ensure adequate hydration, patient reports drinking electrolyte drinks and  water regularly.       No follow-ups on file.    Donato Schultz, DO

## 2023-02-02 NOTE — Assessment & Plan Note (Signed)
Pt needs f/u gi Gi cocktail resolved pain

## 2023-02-02 NOTE — Telephone Encounter (Signed)
DOS - 02/20/23  [place]UNIONECTOM[place] RIGHT --- [id number]  [facility] MEDICAID    RECEIVED A FAX FROM [facility] MEDICAID STATING THAT CPT CODE 19147 NO PRIOR AUTH IS REQUIRED.

## 2023-02-08 ENCOUNTER — Telehealth: Payer: Self-pay | Admitting: Podiatry

## 2023-02-08 ENCOUNTER — Encounter (INDEPENDENT_AMBULATORY_CARE_PROVIDER_SITE_OTHER): Payer: Self-pay

## 2023-02-08 ENCOUNTER — Ambulatory Visit: Payer: Medicaid Other | Attending: Internal Medicine

## 2023-02-08 DIAGNOSIS — M5459 Other low back pain: Secondary | ICD-10-CM | POA: Insufficient documentation

## 2023-02-08 DIAGNOSIS — M6281 Muscle weakness (generalized): Secondary | ICD-10-CM | POA: Diagnosis not present

## 2023-02-08 NOTE — Therapy (Signed)
OUTPATIENT PHYSICAL THERAPY NEURO TREATMENT and D/C Summary   Patient Name: Amanda White MRN: [id number] DOB:1966-06-05, 57 y.o., female Today's Date: 02/08/2023   PCP: Lula Olszewski, MD REFERRING PROVIDER: Lula Olszewski, MD  PHYSICAL THERAPY DISCHARGE SUMMARY  Visits from Start of Care: 4  Current functional level related to goals / functional outcomes: All goals met   Remaining deficits: none   Education / Equipment: HEP   Patient agrees to discharge. Patient goals were met. Patient is being discharged due to meeting the stated rehab goals.  END OF SESSION:  PT End of Session - 02/08/23 1023     Visit Number 4    Number of Visits 5    Date for PT Re-Evaluation 02/08/23    Authorization Type [place] Medicaid [employer]    PT Start Time 1015    PT Stop Time 1100    PT Time Calculation (min) 45 min    Activity Tolerance Patient tolerated treatment well    Behavior During Therapy WFL for tasks assessed/performed              Past Medical History:  Diagnosis Date   Abnormal cardiac CT angiography 10/12/2022   Small intermediate density pericardial effusion versus thickening. Consider further evaluation with echocardiography.   Anxiety    Bipolar 1 disorder (HCC)    Carpal tunnel syndrome 09/25/2022   R sided release 2022, all better now.   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   Depression    Depression    Dizziness 10/09/2022   Saw emergency room 10/03/22   GERD (gastroesophageal reflux disease)    History of weight loss 12/25/2022   Hypertension    LFT elevation 12/25/2022   Lab Results      Component    Value    Date/Time           ALT    28    12/25/2022 10:31 AM           ALT    99 (H)    09/25/2022 09:53 AM            ALT    22    06/22/2020 01:26 PM           ALT    28    05/19/2018 09:51 PM           ALT    13    01/03/2012 09:55 AM             Lab Results      Component    Value    Date/Time           AST    25    12/25/2022 10:31 AM           AST    40 (H)    0   Migraine    Pressure ulcer 11/28/2022   Subcutaneous nodule 09/25/2022   Unintentional weight loss 09/25/2022   November 28, 2022 interim history:   Denies any heartburn, prior abdomen/flank pain gone, just some right upper chest pain now, gastrointestinal did endoscopy and found ulcer but we don't have report.  Off advil. Weight stable. Continuing remeron         [employer]  from Last 5 Encounters:  11/28/22  125 lb 6.4 oz (56.9 kg)  11/24/22  125 lb 6.4 oz (56.9 kg)  11/16/22  125 lb 11.2 oz (57 kg)  10/25/22    Past Surgical History:  Procedure Laterality Date   CARPAL TUNNEL RELEASE Right 04/18/2021   Procedure: RIGHT ENDOSCOPIC CARPAL TUNNEL RELEASE;  Surgeon: Mack Hook, MD;  Location: [facility];  Service: Orthopedics;  Laterality: Right;  LENTH OF SURGERY: 30 MINUTES   DILATION AND CURETTAGE OF UTERUS     Patient Active Problem List   Diagnosis Date Noted   History of peptic ulcer 02/02/2023   Statin intolerance 01/31/2023   Pericardial effusion 01/31/2023   Hallux limitus of right foot 01/02/2023   High gamma glutamyl transferase (GGT) 12/25/2022   Arthritis of first metatarsophalangeal (MTP) joint of right foot 12/25/2022   Onychomycosis 12/25/2022   Family history of rheumatoid arthritis 12/25/2022   History of weight loss 12/25/2022   Fecal retention 12/08/2022   Stress ulcer of stomach 11/28/2022   Moderate episode of recurrent major depressive disorder (HCC) 10/26/2022   Long-term current use of benzodiazepine 10/26/2022   DDD (degenerative disc disease), thoracolumbar 10/25/2022   Aortic atherosclerosis (HCC) 10/12/2022   Abnormal cardiac CT angiography 10/12/2022   History of colon polyps 10/12/2022    Dizziness 10/09/2022   Right-sided chest pain 10/09/2022   Cyst of gallbladder 10/09/2022   Hyperlipidemia 09/25/2022   Benzodiazepine dependence (HCC) 09/25/2022   GERD (gastroesophageal reflux disease) 09/25/2022   Lumbago of lumbar region with sciatica 09/25/2022   Podagra 09/25/2022   Hypertension 09/25/2022   Generalized anxiety disorder 09/25/2022   Memory impairment 09/25/2022   Subcutaneous nodule 09/25/2022   RUQ abdominal tenderness 09/25/2022   Cystic disease of liver 09/25/2022    ONSET DATE: "a little over a year ago"  REFERRING DIAG: M19.90 (ICD-10-CM) - Arthritis M54.50 (ICD-10-CM) - Lumbar pain  THERAPY DIAG:  Other low back pain  Muscle weakness (generalized)  Rationale for Evaluation and Treatment: Rehabilitation  SUBJECTIVE:                                                                                                                                                                                             SUBJECTIVE STATEMENT:  Doing well, doing more and more exercise  Pt accompanied by: self  PERTINENT HISTORY:  Foot Pain: Chronic pain in the right metatarsophalangeal joint has been worsening over several months, suggesting possible gout or arthritis, with a family history of gout and pain exacerbated by joint movement. We will order a foot x-ray and uric acid level, refer her to  a podiatrist for further evaluation, increase her Celebrex dose, and apply Voltaren topical gel to the joint.  I think its most likely diagnosis is gout given the location. So I give low purine eating plan handout in AVS.   Avoid prednisone due to active peptic ulcer disease   PAIN:  Are you having pain? No 0/10  PRECAUTIONS: None  RED FLAGS: None Pt reports increased weight loss over past year due to ulcer and reflux but has been regaining some weight  WEIGHT BEARING RESTRICTIONS: No  FALLS: Has patient fallen in last 6 months? No  LIVING ENVIRONMENT: Lives with:  lives with their family Lives in: House/apartment Stairs: No Has following equipment at home: None  PLOF: Independent  PATIENT GOALS: reduce back pain  OBJECTIVE:   TODAY'S TREATMENT: 02/08/23 Activity Comments  NU-step level 4 x 5 min   Trunk manual muscle test Flexion: 5/5 Extension 4/5  Lifting forms Perfect form and execution  Front plank   Side plank on knees   Bird dog 1x10 W/ 2#       PATIENT EDUCATION: Education details: assessment details. Postural support in sitting to improve lumbar lordosis support and discussion of lifting mechanics reduce risk for strain/injury Person educated: Patient Education method: Explanation Education comprehension: verbalized understanding  HOME EXERCISE PROGRAM: Access Code: [email or web address] URL: [email or web address] Date: 01/11/2023 Prepared by: Shary Decamp  Exercises - Supine Posterior Pelvic Tilt  - 1 x daily - 7 x weekly - 3 sets - 10 reps - 2 sec hold - Hooklying Isometric Hip Flexion  - 1 x daily - 7 x weekly - 3 sets - 10 reps - 2 sec hold - Cat Cow to Child's Pose  - 1 x daily - 7 x weekly - 1-3 sets - 10 reps - breathe hold - Bird Dog  - 1 x daily - 7 x weekly - 3 sets - 10 reps - Deadlift with Pelvic Contraction  - 1 x daily - 7 x weekly - 3 sets - 10 reps - Kettlebell Suitcase Carry  - 1 x daily - 7 x weekly - 3 sets - 10 reps - Standard Plank  - 1 x daily - 7 x weekly - 3-5 sets - 30-60 sec hold - Side Plank on Knees  - 1 x daily - 7 x weekly - 3-5 sets - 30-60 sec hold - Side Plank on Elbow  - 1 x daily - 7 x weekly - 3-5 sets - 30-60 sec hold  DIAGNOSTIC FINDINGS: IMPRESSION: 1. 7 mm anterolisthesis of L5 versus S1. Lower lumbar facet degenerative changes. 2. Moderate fecal loading in the colon.  COGNITION: Overall cognitive status: Within functional limits for tasks assessed   SENSATION: WFL  COORDINATION:   EDEMA:  none  MUSCLE TONE: WNL  MUSCLE LENGTH: Hamstrings WNL: SLR to 80  deg  DTRs:    POSTURE: No Significant postural limitations  LOWER EXTREMITY ROM:     Active  Right Eval Left Eval  Hip flexion    Hip extension    Hip abduction    Hip adduction    Hip internal rotation    Hip external rotation    Knee flexion    Knee extension    Ankle dorsiflexion    Ankle plantarflexion    Ankle inversion    Ankle eversion     (Blank rows = not tested)  Lumbar spine ROM: Flexion: to mid-shin Extension: WFL--increased pain Rotation: right > left Lateral Flexion: WNL--increased left side  LOWER  EXTREMITY MMT:    MMT Right Eval Left Eval  Hip flexion 4 4  Hip extension 3+ 3+  Hip abduction 4+ 4+  Hip adduction    Hip internal rotation    Hip external rotation    Knee flexion 5 5  Knee extension 5 5  Ankle dorsiflexion 5 5  Ankle plantarflexion    Ankle inversion    Ankle eversion    (Blank rows = not tested)   Trunk strength:  Flexion: 3+/5 Extension: 3-/5  PALPATION:  increased discomfort around PSIS and increased discomfort with posterior to anterior pressure around L5-S1   BED MOBILITY:  indep  TRANSFERS: Indep     STAIRS: NT  GAIT: Gait pattern: WFL Distance walked:  Assistive device utilized: None Level of assistance: Complete Independence Comments:         GOALS: Goals reviewed with patient? Yes  SHORT TERM GOALS: Target date: same ast LTG    LONG TERM GOALS: Target date: 02/08/2023    Patient will be independent in HEP to improve functional outcomes Baseline:  Goal status: MET  2.  Improve trunk flexion strength to 4/5 and lumbar extension strength to 3+/5 to improve stability and reduce pain Baseline: 5/5 flexion; 4/5 extension Goal status: MET  3.  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  Pt to teach-back strategies for postural  alignment/mindfulness to reduce postural stress/pain Baseline:  Goal status: MET    ASSESSMENT:  CLINICAL IMPRESSION: Pt reports overall marked improvements with minimal back pain/discomfort and progressing with all strength and endurance activities without issue.  Exhibits improved trunk strength and demonstrates excellent biomechanics with lifting and sitting posture.  Goals met. D/C to HEP with relevant progressions reviewed today  OBJECTIVE IMPAIRMENTS: decreased activity tolerance, decreased strength, improper body mechanics, postural dysfunction, and pain.   ACTIVITY LIMITATIONS: carrying, lifting, bending, sitting, and squatting  PARTICIPATION LIMITATIONS: meal prep, cleaning, and occupation  PERSONAL FACTORS: Time since onset of injury/illness/exacerbation and 1 comorbidity: PMH  are also affecting patient's functional outcome.   REHAB POTENTIAL: Good  CLINICAL DECISION MAKING: Stable/uncomplicated  EVALUATION COMPLEXITY: Low  PLAN:  PT FREQUENCY: 1x/week  PT DURATION: 4 weeks  PLANNED INTERVENTIONS: Therapeutic exercises, Therapeutic activity, Neuromuscular re-education, Balance training, Gait training, Patient/Family education, Self Care, Joint mobilization, Vestibular training, Canalith repositioning, Aquatic Therapy, Dry Needling, Electrical stimulation, Spinal mobilization, Cryotherapy, Moist heat, and Manual therapy  PLAN FOR NEXT SESSION: D/C to HEP   11:19 AM, 02/08/23 M. Shary Decamp, PT, DPT Physical Therapist- [place] Office Number: [phone]

## 2023-02-08 NOTE — Telephone Encounter (Signed)
PT CALLED TO RESCHEDULE SURGERY FROM 09/03 TO 09/10 DUE TO LACK OF HAVING ASSISTANCE WITH POST CARE. ADVISED [facility] AND DR Charlsie Merles

## 2023-02-14 ENCOUNTER — Ambulatory Visit (INDEPENDENT_AMBULATORY_CARE_PROVIDER_SITE_OTHER): Payer: Medicaid Other

## 2023-02-14 DIAGNOSIS — K253 Acute gastric ulcer without hemorrhage or perforation: Secondary | ICD-10-CM | POA: Diagnosis not present

## 2023-02-14 DIAGNOSIS — I3139 Other pericardial effusion (noninflammatory): Secondary | ICD-10-CM | POA: Diagnosis not present

## 2023-02-14 LAB — ECHOCARDIOGRAM COMPLETE
Area-P 1/2: 3.85 cm2
MV M vel: 4.59 m/s
MV Peak grad: 84.3 mmHg
S' Lateral: 2.93 cm

## 2023-02-20 ENCOUNTER — Encounter: Payer: Self-pay | Admitting: Internal Medicine

## 2023-02-20 NOTE — Progress Notes (Signed)
Echocardiogram Results and Symptom Follow-Up Key Findings:  Normal left ventricular ejection fraction (55-60%) Small pericardial effusion, no signs of cardiac tamponade Normal heart valve structure and function Normal pulmonary artery pressure  Ongoing Patient Concerns:  Persistent burning sensation despite triple therapy for peptic ulcer Significant anxiety with associated breathing difficulties History of significant weight loss (56 pounds), now stabilized Known atherosclerosis, deemed not of immediate concern by cardiologist  Action Items:  Schedule patient for follow-up appointment within 1-2 weeks Review current medication regimen, particularly for peptic ulcer and anxiety Ensure patient has seen MyChart message; if not, communicate results via phone Prepare for comprehensive review of symptoms at next visit  Patient Instructions:  Continue all current medications as prescribed Maintain food diary, noting any correlation between diet and symptoms Continue with current diet of smoothies and fresh foods that has helped stabilize weight Practice anxiety management techniques as previously discussed Report any worsening of symptoms, particularly burning sensation or breathing difficulties  Note: Patient may have concerns about the pericardial effusion. Offer reassurance that while it requires monitoring, it's not currently impacting heart function. Emphasize the importance of the follow-up appointment to address all ongoing symptoms comprehensively.

## 2023-02-25 DIAGNOSIS — Z419 Encounter for procedure for purposes other than remedying health state, unspecified: Secondary | ICD-10-CM | POA: Diagnosis not present

## 2023-02-27 HISTORY — PX: FOOT SURGERY: SHX648

## 2023-02-28 ENCOUNTER — Encounter: Payer: Medicaid Other | Admitting: Podiatry

## 2023-03-05 ENCOUNTER — Encounter: Payer: Self-pay | Admitting: Internal Medicine

## 2023-03-05 ENCOUNTER — Ambulatory Visit (INDEPENDENT_AMBULATORY_CARE_PROVIDER_SITE_OTHER): Payer: Medicaid Other | Admitting: Internal Medicine

## 2023-03-05 VITALS — BP 114/70 | HR 91 | Temp 97.9°F | Ht 63.0 in | Wt 136.2 lb

## 2023-03-05 DIAGNOSIS — Z87898 Personal history of other specified conditions: Secondary | ICD-10-CM | POA: Diagnosis not present

## 2023-03-05 DIAGNOSIS — R772 Abnormality of alphafetoprotein: Secondary | ICD-10-CM | POA: Diagnosis not present

## 2023-03-05 DIAGNOSIS — Z8711 Personal history of peptic ulcer disease: Secondary | ICD-10-CM | POA: Diagnosis not present

## 2023-03-05 DIAGNOSIS — K259 Gastric ulcer, unspecified as acute or chronic, without hemorrhage or perforation: Secondary | ICD-10-CM

## 2023-03-05 DIAGNOSIS — R748 Abnormal levels of other serum enzymes: Secondary | ICD-10-CM | POA: Diagnosis not present

## 2023-03-05 DIAGNOSIS — F411 Generalized anxiety disorder: Secondary | ICD-10-CM

## 2023-03-05 DIAGNOSIS — I700 Atherosclerosis of aorta: Secondary | ICD-10-CM | POA: Diagnosis not present

## 2023-03-05 DIAGNOSIS — I3139 Other pericardial effusion (noninflammatory): Secondary | ICD-10-CM | POA: Diagnosis not present

## 2023-03-05 MED ORDER — OXYCODONE-ACETAMINOPHEN 10-325 MG PO TABS: 1.0000 | ORAL_TABLET | ORAL | 0 refills | Status: DC | PRN

## 2023-03-05 MED ORDER — ONDANSETRON HCL 4 MG PO TABS: 4.0000 mg | ORAL_TABLET | Freq: Three times a day (TID) | ORAL | 0 refills | Status: DC | PRN

## 2023-03-05 NOTE — Assessment & Plan Note (Addendum)
Wt Readings from Last 3 Encounters:  03/05/23 136 lb 3.2 oz (61.8 kg)  02/02/23 129 lb 6.4 oz (58.7 kg)  01/31/23 132 lb 3.2 oz (60 kg)  Unintentional Weight Loss She has gained 7 pounds over the past month with no evidence of cancer found in an extensive workup, suggesting the weight gain is likely related to peptic ulcer disease and stress. We will monitor her weight closely and encourage healthy eating and exercise, focusing on muscle gain.

## 2023-03-05 NOTE — Assessment & Plan Note (Signed)
Pericardial Effusion A small, circumferential effusion was identified on an echocardiogram with no current symptoms. She will follow up with a cardiologist. Reassured her I don't think its significant but does need ongoing monitoring.

## 2023-03-05 NOTE — Assessment & Plan Note (Signed)
Couldn't find the labs for this but noted history mildly elevated AFP will plan recheck

## 2023-03-05 NOTE — Progress Notes (Signed)
[facility]: [phone]   -- Medical Office Visit --  Patient:  Amanda White      Age: 57 y.o.       Sex:  female  Date:   03/05/2023 Patient Care Team: Lula Olszewski, MD as PCP - General (Internal Medicine) Jodelle Red, MD as PCP - Cardiology (Cardiology) Fritzi Mandes, MD as Consulting Physician (General Surgery) Autry-Lott, Randa Evens, DO as Resident (Family Medicine) Altamese [place], MD as Consulting Physician (Endocrinology) Sanjuana Kava, NP as Nurse Practitioner (Psychiatry) Today's Healthcare Provider: Lula Olszewski, MD      Assessment & Plan History of weight loss Wt Readings from Last 3 Encounters:  03/05/23 136 lb 3.2 oz (61.8 kg)  02/02/23 129 lb 6.4 oz (58.7 kg)  01/31/23 132 lb 3.2 oz (60 kg)  Unintentional Weight Loss She has gained 7 pounds over the past month with no evidence of cancer found in an extensive workup, suggesting the weight gain is likely related to peptic ulcer disease and stress. We will monitor her weight closely and encourage healthy eating and exercise, focusing on muscle gain. Stress ulcer of stomach Peptic Ulcer Disease Her symptoms of nausea and burning sensation have resolved, and she is currently on Omeprazole 40mg  daily due to a history of stress ulcers and recent weight gain. We will continue Omeprazole 40mg  daily, consider adding Aspirin for aortic atherosclerosis after surgery, and an endoscopy is scheduled for October. History of peptic ulcer Peptic Ulcer Disease Her symptoms of nausea and burning sensation have resolved, and she is currently on Omeprazole 40mg  daily due to a history of stress ulcers and recent weight gain. We will continue Omeprazole 40mg  daily, consider adding Aspirin for aortic atherosclerosis after surgery, and an endoscopy is scheduled for October. Aortic atherosclerosis (HCC) Aortic Atherosclerosis Aortic atherosclerosis was identified on a CT scan during a cancer  workup, and she is currently on Rosuvastatin. We will continue Rosuvastatin and consider adding Aspirin after surgery. Generalized anxiety disorder Generalized Anxiety Disorder Her generalized anxiety disorder is managed with Mirtazapine and Clonazepam, noted to be related to ulcer disease. We will continue current medications and follow up with a psychiatrist for further management.  We coordinated care with a message about the situation to Dr. Elsie Saas Pericardial effusion Pericardial Effusion A small, circumferential effusion was identified on an echocardiogram with no current symptoms. She will follow up with a cardiologist. Reassured her I don't think its significant but does need ongoing monitoring. High gamma glutamyl transferase (GGT) Couldn't find the labs for this but noted history mildly elevated AFP will plan recheck Elevated AFP Will plan recheck in 3 months but minimal elevation reassuring  General Health Maintenance A wellness visit and flu shot are recommended in October to get flu shot, and we will repeat abnormal labs in three months at a problem based visit.       Diagnoses and all orders for this visit: History of weight loss Stress ulcer of stomach History of peptic ulcer Aortic atherosclerosis (HCC) Generalized anxiety disorder Pericardial effusion High gamma glutamyl transferase (GGT)  Attestation:  I have personally spent  35  minutes involved in face-to-face and non-face-to-face activities for this patient on the day of the visit. Professional time spent includes the following activities:  Preparing to see the patient by reviewing medical records prior to and during the encounter; Obtaining, documenting, and reviewing an updated medical history; Performing a medically appropriate examination;  Evaluating, synthesizing, and documenting the available clinical information in the  EMR;  Coordinating/Communicating with other health care professionals; Communicating,  counseling, educating about results to the patient: ;   This time was independent of any separately billable procedure(s).  The extended duration of this patient visit was medically necessary due to several factors:  The patient's health condition is multifaceted, requiring a comprehensive evaluation of patient and their past records to ensure accurate diagnosis and treatment planning; Effective patient education and communication, particularly for patients with complex care needs, often require additional time to ensure the patient (or caregivers) fully understand the care plan;  Coordination of care with other healthcare professionals and services depends on thorough documentation, extending both documentation time and visit durations.  All these factors are integral to providing high-quality patient care and ensuring optimal health outcomes.   Future Appointments  Date Time Provider Department Center  03/14/2023 10:45 AM Lenn Sink, DPM [facility]  03/20/2023  3:00 PM Meta Hatchet, PA [facility] None  03/28/2023 11:15 AM Lenn Sink, DPM [facility]  04/13/2023  9:50 AM [facility]  04/25/2023  9:00 AM Lula Olszewski, MD [facility]           Subjective   57 y.o. female who has Hyperlipidemia; Benzodiazepine dependence (HCC); GERD (gastroesophageal reflux disease); Lumbago of lumbar region with sciatica; Podagra; Hypertension; Generalized anxiety disorder; Memory impairment; Subcutaneous nodule; RUQ abdominal tenderness; Cystic disease of liver; Right-sided chest pain; Cyst of gallbladder; Aortic atherosclerosis (HCC); History of colon polyps; DDD (degenerative disc disease), thoracolumbar; Moderate episode of recurrent major depressive disorder (HCC); Long-term current use of benzodiazepine; PUD (peptic ulcer disease); Fecal retention; High gamma glutamyl transferase (GGT); Arthritis of first metatarsophalangeal (MTP) joint of right foot;  Onychomycosis; Family history of rheumatoid arthritis; History of weight loss; Hallux limitus of right foot; Statin intolerance; Pericardial effusion; and History of peptic ulcer on their problem list. Her reasons/main concerns/chief complaints for today's office visit are 1 month folllow-up   ------------------------------------------------------------------------------------------------------------------------ AI-Extracted: Discussed the use of AI scribe software for clinical note transcription with the patient, who gave verbal consent to proceed.  History of Present Illness   The patient reported a resolution of the previously experienced nausea, attributing it to an episode of heartburn or reflux. The patient has been managing these symptoms with omeprazole and famotidine, and has seen an improvement in her condition.  The patient also reported a significant weight gain of seven pounds within the past month, which she attributes to increased food intake and regular exercise, including walking and resistance training. The patient expressed concern about potential weight gain due to an upcoming foot surgery that might limit her mobility and exercise routine.  The patient's anxiety was also discussed, which she manages with mirtazapine and clonazepam prescribed by her psychiatrist. The patient reported that her anxiety is not well controlled and is often triggered by stress, leading to occasional right-sided chest pain.  The patient also reported a persistent subcutaneous nodule in the right flank, which has been present for years without any significant change in size or associated symptoms.  The patient had a recent echocardiogram due to a small pericardial effusion detected on a previous CT scan. The patient reported that she has not experienced any worsening symptoms related to this condition.  Lastly, the patient reported a past episode of dizziness, which she believes was related to her weight  loss and potential vitamin deficiencies. However, this symptom has since resolved.      She has a past medical history  of Abnormal cardiac CT angiography (10/12/2022), Anxiety, Bipolar 1 disorder (HCC), Carpal tunnel syndrome (09/25/2022), DDD (degenerative disc disease), thoracolumbar (10/25/2022), Depression, Depression, Dizziness (10/09/2022), GERD (gastroesophageal reflux disease), History of weight loss (12/25/2022), Hypertension, LFT elevation (12/25/2022), Migraine, Pressure ulcer (11/28/2022), Subcutaneous nodule (09/25/2022), and Unintentional weight loss (09/25/2022).  Problem list overviews that were updated at today's visit: Problem  Pericardial Effusion   A small pericardial effusion is present. The pericardial effusion is circumferential. There is no evidence of cardiac tamponade. On 11/2022 echocardiogram.   09/2022 CT found Small intermediate density pericardial effusion versus thickening. Consider further evaluation with echocardiography.  Cardiologist Jodelle Red, MD  Repeat 01/2023 Echocardiogram on  confirmed small, circumferential pericardial effusion. No evidence of cardiac tamponade. Left ventricular ejection fraction normal at 55-60%. Monitor with repeat echocardiograms as recommended by cardiology.   High Gamma Glutamyl Transferase (Ggt)   No results found for: "LABGGT"    History of Weight Loss   Previous 56-pound weight loss investigated with multiple tests including CT and MRI without clear etiology identified (but peptic ulcer disease most likely culprit). Weight now stabilized with diet of smoothies and fresh foods. Continue to monitor and encourage current dietary approach.   Aortic Atherosclerosis (Hcc)   Previously diagnosed. Recent echocardiogram shows normal aortic valve structure and function. Cardiologist reportedly per patient report  not overly concerned. Continue monitoring as part of overall cardiovascular health management. Rosuvastatin much  less myalgia than atorvastatin Aspirin held off due to ulcer but she will try soon  Jodelle Red, MD  Date:  12/05/2022  Cardiology Office Note:  .   Amanda White is a 57 y.o. female with PMH gastric ulcer, unintentional weight loss, hypertension, aortic atherosclerosis who is referred for cardiology consultation for evaluation of aortic atherosclerosis at the request of Dr. Jon Billings. Pertinent CV history: aortic atherosclerosis on CT 09/2022. Well controlled hypertension.  Today: Lost 56 lbs unintentionally, now starting to slowly gain weight back. No clear etiology. Under significant stress. Here to discuss plaque seen on her CT.  No prior heart issues. Has had well controlled blood pressure for years, unless she is in pain or stressed.   On atorvastatin for years, had myalgias before but worse on statin, especially increased dose. Worse at night and first thing in the morning. LDL was 124 in April, increased from 20 mg to 40 mg at that time.  Mother had history of MI and CVA, died age 20 of cardiac arrest (came in with pneumonia, was in the hospital for over a month before she passed). Maternal aunt died of MI. Maternal grandfather and all 5 of his brothers had heart issues.  EKG:  NSR at 79 bpm CT angio 10/12/22:  There is minimal aortic atherosclerosis. 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 There is a small amount of intermediate see pericardial fluid versus thickening. The heart size is normal. ASSESSMENT AND PLAN: .   Aortic atherosclerosis Family history of heart disease Hypercholesterolemia Statin myalgia -no aspirin given gastric ulcer -now on atorvastatin, though has muscle aches. Changing to rosuvastatin today. Will start at 10 mg daily dose, increase to 20 mg dose if well tolerated. Recheck lipids/LFTs in 3 mos. -reviewed test results today   Hypertension -well controlled, continue current meds.   CV risk counseling and  prevention -recommend heart healthy/Mediterranean diet, with whole grains, fruits, vegetable, fish, lean meats, nuts, and olive oil. Limit salt. -recommend moderate walking, 3-5 times/week for 30-50 minutes each session. Aim for at least 150 minutes.week. Goal  should be pace of 3 miles/hours, or walking 1.5 miles in 30 minutes -recommend avoidance of tobacco products. Avoid excess alcohol. -ASCVD risk score: The 10-year ASCVD risk score (Arnett DK, et al., 2019) is: 2%   Values used to calculate the score:     Age: 52 years     Sex: Female     Is Non-Hispanic African American: No     Diabetic: No     Tobacco smoker: No     Systolic Blood Pressure: 110 mmHg     Is BP treated: Yes     HDL Cholesterol: 76.5 mg/dL     Total Cholesterol: 221 mg/dL     Dispo: 1 year or sooner as needed   Generalized Anxiety Disorder   Generalized anxiety disorder (ICD-10: F41.1) Patient reports significant anxiety, including episodes of feeling unable to breathe and sensation of pressure on chest. May be contributing to or exacerbating other symptoms. Review current medication efficacy and consider adjustments or additional management strategies.  Following with psychiatry: Dr. Elsie Saas  Prior history: Gets really nervous with arguing or with home purchasing Long term Klonopin   Abnormal Cardiac CT Angiography (Resolved)   09/2022 CT found Small intermediate density pericardial effusion versus thickening. Consider further evaluation with echocardiography.   Dizziness (Resolved)   Saw emergency room 10/03/22 workup with mr abdomen, labwork, EKG were all  unrevealing  Plans for cardiology 12/2022 Per 10/25/22 report When laying on back or on right side. Has to take deep breaths to be able to breathe. At 10/03/22 emergency room visit: 57 year old female presents emergency department with epigastric abdominal pain that was first noticed last night. Patient endorses that around 1 AM epigastric pain became worse, with  associated nausea without vomiting or diarrhea. Patient also states that she has had unintentional weight loss that has been ongoing since May 2023, previously seen in this facility roughly x 1 month ago. EKG and MRI and lab review negative for cause. Released.  Orthostatics were positive Attributed as possibly due to remeron    Current Outpatient Medications on File Prior to Visit  Medication Sig   acetaminophen (TYLENOL) 325 MG tablet Take 650 mg by mouth every 6 (six) hours as needed.   carbamazepine (TEGRETOL) 100 MG chewable tablet Chew 1 tablet (100 mg total) by mouth 2 (two) times daily.   celecoxib (CELEBREX) 200 MG capsule Take 1 capsule (200 mg total) by mouth 2 (two) times daily.   ciclopirox (LOPROX) 0.77 % cream Apply topically 2 (two) times daily.   clonazePAM (KLONOPIN) 2 MG tablet Take 1 tablet (2 mg total) by mouth at bedtime.   cyclobenzaprine (FLEXERIL) 10 MG tablet Take 1 tablet (10 mg total) by mouth daily as needed for muscle spasms.   diclofenac Sodium (VOLTAREN) 1 % GEL Apply 4 g topically 4 (four) times daily as needed.   famotidine (PEPCID) 20 MG tablet Take 20 mg by mouth 2 (two) times daily.   famotidine-calcium carbonate-magnesium hydroxide (PEPCID COMPLETE) 10-800-165 MG chewable tablet Chew 1 tablet by mouth daily as needed.   gabapentin (NEURONTIN) 300 MG capsule Take 1 capsule (300 mg total) by mouth 3 (three) times daily.   lisinopril-hydrochlorothiazide (PRINZIDE,ZESTORETIC) 10-12.5 MG tablet Take 1 tablet by mouth daily.   mirtazapine (REMERON) 7.5 MG tablet TAKE 1 TABLET BY MOUTH AT BEDTIME.   ondansetron (ZOFRAN ODT) 4 MG disintegrating tablet Take 1 tablet (4 mg total) by mouth every 8 (eight) hours as needed for nausea or vomiting.   pantoprazole (PROTONIX) 40  MG tablet Take 1 tablet (40 mg total) by mouth 2 (two) times daily before a meal.   rosuvastatin (CRESTOR) 10 MG tablet Take 1 tablet (10 mg total) by mouth daily for 30 days, THEN 2 tablets (20 mg  total) daily.   sucralfate (CARAFATE) 1 g tablet Take 1 g by mouth 3 (three) times daily.   No current facility-administered medications on file prior to visit.  There are no discontinued medications.   Objective   Physical Exam  BP 114/70 (BP Location: Left Arm, Patient Position: Sitting)   Pulse 91   Temp 97.9 F (36.6 C) (Temporal)   Ht 5\' 3"  (1.6 m)   Wt 136 lb 3.2 oz (61.8 kg)   LMP 12/20/2011   SpO2 97%   BMI 24.13 kg/m  Wt Readings from Last 10 Encounters:  03/05/23 136 lb 3.2 oz (61.8 kg)  02/02/23 129 lb 6.4 oz (58.7 kg)  01/31/23 132 lb 3.2 oz (60 kg)  12/25/22 129 lb 6.4 oz (58.7 kg)  12/05/22 128 lb 1.6 oz (58.1 kg)  11/28/22 125 lb 6.4 oz (56.9 kg)  11/24/22 125 lb 6.4 oz (56.9 kg)  11/16/22 125 lb 11.2 oz (57 kg)  10/25/22 125 lb 6.4 oz (56.9 kg)  10/09/22 122 lb (55.3 kg)   Vital signs reviewed.  Nursing notes reviewed. Weight trend reviewed. Abnormalities and Problem-Specific physical exam findings:    General Appearance:  No acute distress appreciable.   Well-groomed, healthy-appearing female.  Well proportioned with no abnormal fat distribution.  Good muscle tone. Pulmonary:  Normal work of breathing at rest, no respiratory distress apparent. SpO2: 97 %  Musculoskeletal: All extremities are intact.  Neurological:  Awake, alert, oriented, and engaged.  No obvious focal neurological deficits or cognitive impairments.  Sensorium seems unclouded.   Speech is clear and coherent with logical content. Psychiatric:  Appropriate mood, pleasant and cooperative demeanor, thoughtful and engaged during the exam  Results   LABS AFP: Minimally elevated  RADIOLOGY CT scan: No evidence of malignancy MRI: No evidence of malignancy  DIAGNOSTIC Echocardiogram: Small circumferential pericardial effusion Left ventricular ejection fraction: 55-60%        No results found for any visits on 03/05/23.  Appointment on 02/14/2023  Component Date Value   S' Lateral  02/14/2023 2.93    Area-P 1/2 02/14/2023 3.85    MV M vel 02/14/2023 4.59    MV Peak grad 02/14/2023 84.3    Est EF 02/14/2023 55 - 60%   Office Visit on 12/25/2022  Component Date Value   Uric Acid, Serum 12/25/2022 3.8    Sed Rate 12/25/2022 11    CRP 12/25/2022 <1.0    Sodium 12/25/2022 140    Potassium 12/25/2022 4.6    Chloride 12/25/2022 99    CO2 12/25/2022 34 (H)    Glucose, Bld 12/25/2022 94    BUN 12/25/2022 14    Creatinine, Ser 12/25/2022 0.65    Total Bilirubin 12/25/2022 0.4    Alkaline Phosphatase 12/25/2022 50    AST 12/25/2022 25    ALT 12/25/2022 28    Total Protein 12/25/2022 7.4    Albumin 12/25/2022 4.8    GFR 12/25/2022 97.31    Calcium 12/25/2022 10.7 (H)    AFP-Tumor Marker 12/25/2022 9.6 (H)    Cortisol, Plasma 12/25/2022 9.4    C206 ACTH 12/25/2022 14   Office Visit on 11/28/2022  Component Date Value   H pylori Ag, Stl 11/30/2022 Negative   Lab on 11/21/2022  Component  Date Value   Sodium 11/21/2022 136    Potassium 11/21/2022 4.1    Chloride 11/21/2022 97    CO2 11/21/2022 30    Glucose, Bld 11/21/2022 93    BUN 11/21/2022 15    Creatinine, Ser 11/21/2022 0.72    GFR 11/21/2022 92.47    Calcium 11/21/2022 9.9    TSH 11/21/2022 1.31    Free T4 11/21/2022 0.76   Office Visit on 11/16/2022  Component Date Value   High risk HPV 11/16/2022 Negative    Adequacy 11/16/2022 Satisfactory for evaluation; transformation zone component PRESENT.    Diagnosis 11/16/2022 - Negative for intraepithelial lesion or malignancy (NILM)    Comment 11/16/2022 Atrophic changes are present.    Comment 11/16/2022 Normal Reference Range HPV - Negative    Glucose, UA 11/16/2022 NEGATIVE    Bilirubin Urine 11/16/2022 NEGATIVE    Ketones, ur 11/16/2022 NEGATIVE    Specific Gravity, Urine 11/16/2022 1.010    Hgb urine dipstick 11/16/2022 NEGATIVE    pH 11/16/2022 7.0    Protein, ur 11/16/2022 NEGATIVE    Urobilinogen, UA 11/16/2022 0.2    Nitrite 11/16/2022  NEGATIVE    Leukocytes,Ua 11/16/2022 NEGATIVE   Abstract on 11/08/2022  Component Date Value   [facility] Colonoscopy 05/10/2021 See Report (in chart)   Scanned Document on 10/11/2022  Component Date Value   [facility] Colonoscopy 05/25/2021 See Report (in chart)   Office Visit on 09/25/2022  Component Date Value   Hepatitis C Ab 09/25/2022 NON-REACTIVE    HIV 1&2 Ab, 4th Generati* 09/25/2022 NON-REACTIVE    Cholesterol 09/25/2022 221 (H)    Triglycerides 09/25/2022 102.0    HDL 09/25/2022 76.50    VLDL 09/25/2022 20.4    LDL Cholesterol 09/25/2022 124 (H)    Total CHOL/HDL Ratio 09/25/2022 3    NonHDL 09/25/2022 144.80    WBC 09/25/2022 7.2    RBC 09/25/2022 3.96    Platelets 09/25/2022 423.0 (H)    Hemoglobin 09/25/2022 13.0    HCT 09/25/2022 37.1    MCV 09/25/2022 93.8    MCHC 09/25/2022 34.9    RDW 09/25/2022 13.0    Sodium 09/25/2022 138    Potassium 09/25/2022 3.6    Chloride 09/25/2022 98    CO2 09/25/2022 30    Glucose, Bld 09/25/2022 96    BUN 09/25/2022 17    Creatinine, Ser 09/25/2022 0.64    Total Bilirubin 09/25/2022 0.7    Alkaline Phosphatase 09/25/2022 47    AST 09/25/2022 40 (H)    ALT 09/25/2022 99 (H)    Total Protein 09/25/2022 7.2    Albumin 09/25/2022 4.9    GFR 09/25/2022 97.85    Calcium 09/25/2022 10.1    Cyclic Citrullin Peptide* 09/25/2022 <16    Rheumatoid fact SerPl-aC* 09/25/2022 <14    Anti Nuclear Antibody (A* 09/25/2022 NEGATIVE    VITD 09/25/2022 26.68 (L)    Vitamin B-12 09/25/2022 306    Folate 09/25/2022 11.2    ANCA SCREEN 09/25/2022 Negative    T3 Uptake 09/25/2022 33    T4, Total 09/25/2022 7.7    Free Thyroxine Index 09/25/2022 2.5    TSH 09/25/2022 0.63    No image results found.   ECHOCARDIOGRAM COMPLETE  Result Date: 02/14/2023    ECHOCARDIOGRAM REPORT   Patient Name:   Amanda White Date of Exam: 02/14/2023 Medical Rec #:  [id number]          Height:       63.0 in Accession #:  [id number]         Weight:       129.4 lb Date  of Birth:  [birth year]          BSA:          1.607 m Patient Age:    57 years           BP:           110/60 mmHg Patient Gender: F                  HR:           88 bpm. Exam Location:  Outpatient Procedure: 2D Echo, 3D Echo, Color Doppler, Cardiac Doppler and Strain Analysis Indications:    Pericardial Effusion  History:        Patient has no prior history of Echocardiogram examinations.                 Risk Factors:Hypertension, Non-Smoker and Dyslipidemia.  Sonographer:    Jeryl Columbia RDCS Referring Phys: [id number] Lula Olszewski  Sonographer Comments: Patient denies any unusual shortness of breath. IMPRESSIONS  1. Left ventricular ejection fraction, by estimation, is 55 to 60%. The left ventricle has normal function. The left ventricle has no regional wall motion abnormalities. Left ventricular diastolic parameters were normal.  2. Right ventricular systolic function is normal. The right ventricular size is normal. There is normal pulmonary artery systolic pressure. The estimated right ventricular systolic pressure is 30.0 mmHg.  3. A small pericardial effusion is present. The pericardial effusion is circumferential. There is no evidence of cardiac tamponade.  4. The mitral valve is normal in structure. No evidence of mitral valve regurgitation. No evidence of mitral stenosis.  5. The aortic valve is normal in structure. Aortic valve regurgitation is not visualized. No aortic stenosis is present.  6. The inferior vena cava is normal in size with greater than 50% respiratory variability, suggesting right atrial pressure of 3 mmHg. Comparison(s): 10/12/22 CT There is a small amount of intermediate see pericardial fluid versus thickening.  FINDINGS  Left Ventricle: Left ventricular ejection fraction, by estimation, is 55 to 60%. The left ventricle has normal function. The left ventricle has no regional wall motion abnormalities. Global longitudinal strain performed but not reported based on interpreter judgement  due to suboptimal tracking. 3D ejection fraction reviewed and evaluated as part of the interpretation. Alternate measurement of EF is felt to be most reflective of LV function. The left ventricular internal cavity size was normal in  size. There is no left ventricular hypertrophy. Left ventricular diastolic parameters were normal. Normal left ventricular filling pressure. Right Ventricle: The right ventricular size is normal. No increase in right ventricular wall thickness. Right ventricular systolic function is normal. There is normal pulmonary artery systolic pressure. The tricuspid regurgitant velocity is 2.60 m/s, and  with an assumed right atrial pressure of 3 mmHg, the estimated right ventricular systolic pressure is 30.0 mmHg. Left Atrium: Left atrial size was normal in size. Right Atrium: Right atrial size was normal in size. Pericardium: A small pericardial effusion is present. The pericardial effusion is circumferential. There is no evidence of cardiac tamponade. Mitral Valve: The mitral valve is normal in structure. No evidence of mitral valve regurgitation. No evidence of mitral valve stenosis. Tricuspid Valve: The tricuspid valve is normal in structure. Tricuspid valve regurgitation is mild . No evidence of tricuspid stenosis. Aortic Valve: The aortic valve is normal in structure. Aortic valve regurgitation is not visualized.  No aortic stenosis is present. Pulmonic Valve: The pulmonic valve was normal in structure. Pulmonic valve regurgitation is not visualized. No evidence of pulmonic stenosis. Aorta: The aortic root is normal in size and structure. Venous: The inferior vena cava is normal in size with greater than 50% respiratory variability, suggesting right atrial pressure of 3 mmHg. IAS/Shunts: No atrial level shunt detected by color flow Doppler.  LEFT VENTRICLE PLAX 2D LVIDd:         4.04 cm   Diastology LVIDs:         2.93 cm   LV e' medial:    9.46 cm/s LV PW:         0.95 cm   LV E/e' medial:   9.0 LV IVS:        0.68 cm   LV e' lateral:   12.50 cm/s LVOT diam:     2.00 cm   LV E/e' lateral: 6.8 LV SV:         66 LV SV Index:   41 LVOT Area:     3.14 cm                           3D Volume EF:                          3D EF:        46 %                          LV EDV:       83 ml                          LV ESV:       45 ml                          LV SV:        38 ml RIGHT VENTRICLE RV Basal diam:  3.19 cm RV Mid diam:    2.36 cm RV S prime:     14.80 cm/s TAPSE (M-mode): 2.4 cm LEFT ATRIUM             Index        RIGHT ATRIUM           Index LA diam:        3.30 cm 2.05 cm/m   RA Area:     13.60 cm LA Vol (A2C):   44.4 ml 27.63 ml/m  RA Volume:   33.20 ml  20.66 ml/m LA Vol (A4C):   30.1 ml 18.73 ml/m LA Biplane Vol: 38.9 ml 24.21 ml/m  AORTIC VALVE LVOT Vmax:   116.00 cm/s LVOT Vmean:  76.200 cm/s LVOT VTI:    0.211 m  AORTA Ao Root diam: 2.60 cm Ao Asc diam:  3.10 cm MITRAL VALVE               TRICUSPID VALVE MV Area (PHT): 3.85 cm    TR Peak grad:   27.0 mmHg MV Decel Time: 197 msec    TR Vmax:        260.00 cm/s MR Peak grad: 84.3 mmHg MR Vmax:      459.00 cm/s  SHUNTS MV E velocity: 85.00 cm/s  Systemic VTI:  0.21 m MV A velocity: 78.00 cm/s  Systemic Diam:  2.00 cm MV E/A ratio:  1.09 Armanda Magic MD Electronically signed by Armanda Magic MD Signature Date/Time: 02/14/2023/9:32:08 PM    Final    DG Foot 2 Views Left  Result Date: 01/01/2023 Please see detailed radiograph report in office note.  DG Foot 2 Views Right  Result Date: 01/01/2023 Please see detailed radiograph report in office note.  DG Foot Complete Right  Result Date: 12/30/2022 CLINICAL DATA:  Right metatarsophalangeal pain mainly near great toe metatarsophalangeal joint for 1 year. Intermittent and worsening last 2 months. EXAM: RIGHT FOOT COMPLETE - 3+ VIEW COMPARISON:  None Available. FINDINGS: Mild hallux valgus. Severe lateral great toe metatarsophalangeal joint space narrowing bone-on-bone contact, subchondral  sclerosis, and mild-to-moderate lateral and dorsal degenerative osteophytosis. Mild first tarsometatarsal lateral joint space narrowing and dorsal and lateral degenerative osteophytes. No acute fracture or dislocation. IMPRESSION: 1. Mild hallux valgus. Severe great toe metatarsophalangeal joint osteoarthritis. 2. Mild first tarsometatarsal osteoarthritis. Electronically Signed   By: Neita Garnet M.D.   On: 12/30/2022 14:24    DG Foot Complete Right  Result Date: 12/30/2022 CLINICAL DATA:  Right metatarsophalangeal pain mainly near great toe metatarsophalangeal joint for 1 year. Intermittent and worsening last 2 months. EXAM: RIGHT FOOT COMPLETE - 3+ VIEW COMPARISON:  None Available. FINDINGS: Mild hallux valgus. Severe lateral great toe metatarsophalangeal joint space narrowing bone-on-bone contact, subchondral sclerosis, and mild-to-moderate lateral and dorsal degenerative osteophytosis. Mild first tarsometatarsal lateral joint space narrowing and dorsal and lateral degenerative osteophytes. No acute fracture or dislocation. IMPRESSION: 1. Mild hallux valgus. Severe great toe metatarsophalangeal joint osteoarthritis. 2. Mild first tarsometatarsal osteoarthritis. Electronically Signed   By: Neita Garnet M.D.   On: 12/30/2022 14:24       Additional Info: This encounter employed real-time, collaborative documentation. The patient actively reviewed and updated their medical record on a shared screen, ensuring transparency and facilitating joint problem-solving for the problem list, overview, and plan. This approach promotes accurate, informed care. The treatment plan was discussed and reviewed in detail, including medication safety, potential side effects, and all patient questions. We confirmed understanding and comfort with the plan. Follow-up instructions were established, including contacting the office for any concerns, returning if symptoms worsen, persist, or new symptoms develop, and precautions for  potential emergency department visits.

## 2023-03-05 NOTE — Assessment & Plan Note (Signed)
Peptic Ulcer Disease Her symptoms of nausea and burning sensation have resolved, and she is currently on Omeprazole 40mg  daily due to a history of stress ulcers and recent weight gain. We will continue Omeprazole 40mg  daily, consider adding Aspirin for aortic atherosclerosis after surgery, and an endoscopy is scheduled for October.

## 2023-03-05 NOTE — Addendum Note (Signed)
Addended by: Lenn Sink on: 03/05/2023 05:19 PM   Modules accepted: Orders

## 2023-03-05 NOTE — Assessment & Plan Note (Signed)
Generalized Anxiety Disorder Her generalized anxiety disorder is managed with Mirtazapine and Clonazepam, noted to be related to ulcer disease. We will continue current medications and follow up with a psychiatrist for further management.  We coordinated care with a message about the situation to Dr. Elsie Saas

## 2023-03-05 NOTE — Assessment & Plan Note (Signed)
Aortic Atherosclerosis Aortic atherosclerosis was identified on a CT scan during a cancer workup, and she is currently on Rosuvastatin. We will continue Rosuvastatin and consider adding Aspirin after surgery.

## 2023-03-06 DIAGNOSIS — M21611 Bunion of right foot: Secondary | ICD-10-CM | POA: Diagnosis not present

## 2023-03-06 DIAGNOSIS — M2011 Hallux valgus (acquired), right foot: Secondary | ICD-10-CM | POA: Diagnosis not present

## 2023-03-06 DIAGNOSIS — M2021 Hallux rigidus, right foot: Secondary | ICD-10-CM | POA: Diagnosis not present

## 2023-03-07 ENCOUNTER — Encounter: Payer: Medicaid Other | Admitting: Podiatry

## 2023-03-08 ENCOUNTER — Other Ambulatory Visit: Payer: Self-pay | Admitting: Podiatry

## 2023-03-08 ENCOUNTER — Other Ambulatory Visit: Payer: Self-pay | Admitting: Internal Medicine

## 2023-03-08 ENCOUNTER — Telehealth: Payer: Self-pay | Admitting: Podiatry

## 2023-03-08 DIAGNOSIS — R5383 Other fatigue: Secondary | ICD-10-CM

## 2023-03-08 MED ORDER — OXYCODONE-ACETAMINOPHEN 10-325 MG PO TABS: 1.0000 | ORAL_TABLET | ORAL | 0 refills | Status: AC | PRN

## 2023-03-08 NOTE — Telephone Encounter (Signed)
pt is still in pain from the surgery and would like a refill for the weekend

## 2023-03-08 NOTE — Telephone Encounter (Signed)
Prescription Request  03/08/2023  LOV: 03/05/2023  What is the name of the medication or equipment?  gabapentin (NEURONTIN) 300 MG capsule   lisinopril-hydrochlorothiazide (PRINZIDE,ZESTORETIC) 10-12.5 MG tablet    Have you contacted your pharmacy to request a refill? No   Which pharmacy would you like this sent to?  [id number] [place], [place] - [address] [place] [address] Phone: [phone] Fax: [phone]    Patient notified that their request is being sent to the clinical staff for review and that they should receive a response within 2 business days.   Please advise at Mobile [phone] (mobile)

## 2023-03-08 NOTE — Telephone Encounter (Signed)
Notified pt that medication was sent in.  Talked with Dr Al Corpus about the burning and he said to tell pt that it is normal and to take only the ace bandage off and rewrap it it could be because it is too tight. I did tell pt to not touch the white bandage under the ace wrap.

## 2023-03-08 NOTE — Telephone Encounter (Signed)
Pt called back and had not heard anything but is having burning on the top of the foot/pain where surgery was when walking. She cannot take ibuprofen but has been trying to take tylenol but will be out of pain medicine before the weekend.t is a Dr Charlsie Merles pt.

## 2023-03-09 ENCOUNTER — Other Ambulatory Visit: Payer: Self-pay | Admitting: Family Medicine

## 2023-03-09 DIAGNOSIS — K219 Gastro-esophageal reflux disease without esophagitis: Secondary | ICD-10-CM

## 2023-03-09 NOTE — Telephone Encounter (Signed)
Pt called stated her pharmacy never received a refill for her medication. She said she has been calling and no one was picking up. She said she will go up to the pharmacy to see if it is there

## 2023-03-12 NOTE — Telephone Encounter (Signed)
She is coming in today

## 2023-03-14 ENCOUNTER — Telehealth: Payer: Self-pay | Admitting: Internal Medicine

## 2023-03-14 ENCOUNTER — Other Ambulatory Visit: Payer: Self-pay | Admitting: *Deleted

## 2023-03-14 ENCOUNTER — Encounter: Payer: Self-pay | Admitting: Podiatry

## 2023-03-14 ENCOUNTER — Ambulatory Visit (INDEPENDENT_AMBULATORY_CARE_PROVIDER_SITE_OTHER): Payer: Medicaid Other

## 2023-03-14 ENCOUNTER — Encounter: Payer: Medicaid Other | Admitting: Podiatry

## 2023-03-14 ENCOUNTER — Ambulatory Visit (INDEPENDENT_AMBULATORY_CARE_PROVIDER_SITE_OTHER): Payer: Medicaid Other | Admitting: Podiatry

## 2023-03-14 DIAGNOSIS — Z9889 Other specified postprocedural states: Secondary | ICD-10-CM

## 2023-03-14 MED ORDER — LISINOPRIL-HYDROCHLOROTHIAZIDE 10-12.5 MG PO TABS: 1.0000 | ORAL_TABLET | Freq: Every day | ORAL | 1 refills | Status: DC

## 2023-03-14 NOTE — Progress Notes (Signed)
Subjective:   Patient ID: Amanda White, female   DOB: 57 y.o.   MRN: [id number]   HPI Patient states overall doing well with foot and states still having swelling pain if she is on it for too long still using crutches   ROS      Objective:  Physical Exam  Neuro vascular status intact negative Denna Haggard' sign noted wound edges are coapted well range of motion good moderate edema consistent with 8 days postop     Assessment:  Overall doing well post osteotomy right first metatarsal biplanar     Plan:  H&P x-ray reviewed reapplied sterile dressing continue immobilization dispensed surgical shoe explained range of motion exercises and gradual increase in weightbearing reappoint 4 weeks  X-rays indicate excellent healing osteotomy fixation in place joint congruence

## 2023-03-14 NOTE — Telephone Encounter (Signed)
Prescription Request  03/14/2023  LOV: 03/05/2023  What is the name of the medication or equipment?  lisinopril-hydrochlorothiazide (PRINZIDE,ZESTORETIC) 10-12.5 MG tablet   Have you contacted your pharmacy to request a refill? Yes   Which pharmacy would you like this sent to?  [id number] [place], [place] - [address] [place] [address] Phone: [phone] Fax: [phone]    Patient notified that their request is being sent to the clinical staff for review and that they should receive a response within 2 business days.   Please advise at Mobile [phone] (mobile)

## 2023-03-14 NOTE — Telephone Encounter (Signed)
Rx sent to the pharmacy.

## 2023-03-16 ENCOUNTER — Telehealth: Payer: Self-pay | Admitting: Podiatry

## 2023-03-16 NOTE — Telephone Encounter (Signed)
Patient called this morning - she is in a lot of pain. She has been taking Tylenol, but it is not helping. Pain is radiating into her ankle - please advise if stronger medication can be sent to her pharmacy -thanks

## 2023-03-16 NOTE — Telephone Encounter (Signed)
Pt called again stating that she is in a lot of pain and the tylenol is not helping. It is starting to hurt her ankle and up her leg.Could she please get something stronger.

## 2023-03-19 ENCOUNTER — Other Ambulatory Visit: Payer: Self-pay | Admitting: Podiatry

## 2023-03-19 MED ORDER — HYDROCODONE-ACETAMINOPHEN 10-325 MG PO TABS: 1.0000 | ORAL_TABLET | Freq: Three times a day (TID) | ORAL | 0 refills | Status: AC | PRN

## 2023-03-20 ENCOUNTER — Ambulatory Visit (HOSPITAL_COMMUNITY): Payer: Medicaid Other | Admitting: Physician Assistant

## 2023-03-20 ENCOUNTER — Encounter (HOSPITAL_COMMUNITY): Payer: Self-pay | Admitting: Physician Assistant

## 2023-03-20 VITALS — BP 142/79 | HR 87 | Temp 98.0°F | Ht 63.0 in | Wt 139.0 lb

## 2023-03-20 DIAGNOSIS — F411 Generalized anxiety disorder: Secondary | ICD-10-CM | POA: Diagnosis not present

## 2023-03-20 DIAGNOSIS — Z79899 Other long term (current) drug therapy: Secondary | ICD-10-CM | POA: Diagnosis not present

## 2023-03-20 DIAGNOSIS — F331 Major depressive disorder, recurrent, moderate: Secondary | ICD-10-CM

## 2023-03-20 MED ORDER — CARBAMAZEPINE 100 MG PO CHEW: 100.0000 mg | CHEWABLE_TABLET | Freq: Two times a day (BID) | ORAL | 1 refills | Status: DC

## 2023-03-20 MED ORDER — CLONAZEPAM 2 MG PO TABS
2.0000 mg | ORAL_TABLET | Freq: Every day | ORAL | 1 refills | Status: DC
Start: 2023-04-08 — End: 2023-06-06

## 2023-03-20 NOTE — Progress Notes (Signed)
BH MD/PA/NP OP Progress Note  03/20/2023 9:59 PM Amanda White  MRN:  [id number]  Chief Complaint:  Chief Complaint  Patient presents with   Follow-up   Medication Refill   HPI:   Amanda White is a 57 year old female with a past psychiatric history significant for generalized anxiety disorder and major depressive disorder who presents to [facility] for follow-up and medication management.  Patient is currently being managed on the following psychiatric medication:  Clonazepam 2 mg at bedtime Mirtazapine 7.5 mg at bedtime Carbamazepine 100 mg 2 times daily  Patient reports that she has a lot going on in her life and she is interested in talking with a therapist.  She reports that one of her sons was recently hospitalized due to cirrhosis of the liver from his alcohol use.  She also reports that his mood has been changing since using alcohol.  She also reports that she and her husband are not seeing eye to eye.  She reports that her husband has an idea of what a woman should be doing or how she should be thinking.  As a result, patient reports that there have been many arguments between she and her husband and she feels like she is always walking on eggshells when around him.  In addition to issues between her and her husband, patient reports that she is unable to walk due to having to wear a brace on her foot for at least 4 more weeks.  Although patient endorses feeling stressed out, she reports that her medications continue to be helpful.  She reports that she wishes her grandmother was still around so that she could talk to her about her problems.  Patient endorses some depression and rates her depression a 4-5 out of 10 with 10 being most severe.  Patient reports that she still continues to take her clonazepam as needed for sleep.  She reports that the only thing that appears to brighten her mood is talking with her brother.  Patient reports  that she would also like to get a new job as well as some friends.  A PHQ-9 screen was performed with the patient scoring a 6.  A GAD-7 screen was also performed with the patient scoring an 18.  Patient is alert and oriented x 4, calm, cooperative, and fully engaged in conversation during the encounter.  Patient endorses crappy mood.  Patient denies suicidal or homicidal ideations.  She further denies auditory or visual hallucinations and does not appear to be responding to internal/external stimuli.  Patient endorses good sleep and receives on average 7 to 8 hours of sleep per night.  Patient endorses good appetite and eats on average 3 meals per day.  Patient denies alcohol consumption, tobacco use, or illicit drug use.  Visit Diagnosis:    ICD-10-CM   1. Generalized anxiety disorder  F41.1 carbamazepine (TEGRETOL) 100 MG chewable tablet    clonazePAM (KLONOPIN) 2 MG tablet      Past Psychiatric History:  Patient reports that she has a history of mental health and has been treated for the last 20 years.  She reports that a lot of her mental health extends from her husband.  Patient is diagnosed with generalized anxiety disorder that is being managed by her primary care provider   Patient denies a past history of hospitalization due to mental health   Patient denies a past history of suicide attempts Patient denies a past history of homicide attempts  Past Medical History:  Past Medical History:  Diagnosis Date   Abnormal cardiac CT angiography 10/12/2022   Small intermediate density pericardial effusion versus thickening. Consider further evaluation with echocardiography.   Anxiety    Bipolar 1 disorder (HCC)    Carpal tunnel syndrome 09/25/2022   R sided release 2022, all better now.   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   Depression    Depression    Dizziness 10/09/2022   Saw emergency room 10/03/22   GERD (gastroesophageal reflux disease)    History of weight loss 12/25/2022   Hypertension    LFT elevation 12/25/2022   Lab Results      Component    Value    Date/Time           ALT    28    12/25/2022 10:31 AM           ALT    99 (H)    09/25/2022 09:53 AM           ALT    22    06/22/2020 01:26 PM           ALT    28    05/19/2018 09:51 PM           ALT    13    01/03/2012 09:55 AM             Lab Results      Component    Value    Date/Time           AST    25    12/25/2022 10:31 AM           AST    40 (H)    0   Migraine    Pressure ulcer 11/28/2022   Subcutaneous nodule 09/25/2022   Unintentional weight loss 09/25/2022   November 28, 2022 interim history:   Denies any heartburn, prior abdomen/flank pain gone, just some right upper chest pain now, gastrointestinal did endoscopy and found ulcer but we don't have report.  Off advil. Weight stable. Continuing remeron         Wt Readings from Last 5 Encounters:  11/28/22  125 lb 6.4 oz (56.9 kg)  11/24/22  125 lb 6.4 oz (56.9 kg)  11/16/22  125 lb 11.2 oz (57 kg)  10/25/22     Past Surgical History:  Procedure Laterality Date   CARPAL TUNNEL RELEASE Right 04/18/2021   Procedure: RIGHT ENDOSCOPIC CARPAL TUNNEL RELEASE;  Surgeon: Mack Hook, MD;  Location: [facility];  Service: Orthopedics;  Laterality: Right;  LENTH OF SURGERY: 30 MINUTES   DILATION AND CURETTAGE OF UTERUS      Family Psychiatric History:  Patient reports that her mother's side of the family struggles with mental health.  She reports that her mother's side of the family from [place] and states that many family members saw many of the atrocities during World War II.   Family history of suicide attempts: Patient denies Family history of homicide attempts: Patient denies Family history of substance abuse:  Patient reports that her nephew abused heroin.  She reports that her brother uses marijuana frequently.  She reports that her father was an alcoholic  Family  History:  Family History  Problem Relation Age of Onset   Hypertension Mother    Stroke Father     Social History:  Social History   Socioeconomic History   Marital status: Married    Spouse name: Not on file   Number of children: Not on file   Years of education: Not on file   Highest education level: Bachelor's degree (e.g., BA, AB, BS)  Occupational History   Not on file  Tobacco Use   Smoking status: Never   Smokeless tobacco: Never  Substance and Sexual Activity   Alcohol use: No    Comment: occas.   Drug use: No   Sexual activity: Not on file  Other Topics Concern   Not on file  Social History Narrative   Not on file   Social Determinants of Health   Financial Resource Strain: Medium Risk (11/27/2022)   Overall Financial Resource Strain (CARDIA)    Difficulty of Paying Living Expenses: Somewhat hard  Food Insecurity: No Food Insecurity (11/27/2022)   Hunger Vital Sign    Worried About Running Out of Food in the Last Year: Never true    Ran Out of Food in the Last Year: Never true  Transportation Needs: No Transportation Needs (11/27/2022)   PRAPARE - Administrator, Civil Service (Medical): No    Lack of Transportation (Non-Medical): No  Physical Activity: Sufficiently Active (11/27/2022)   Exercise Vital Sign    Days of Exercise per Week: 7 days    Minutes of Exercise per Session: 40 min  Stress: Stress Concern Present (11/27/2022)   [employer] of Occupational Health - Occupational Stress Questionnaire    Feeling of Stress : Very much  Social Connections: Unknown (11/27/2022)   Social Connection and Isolation Panel [NHANES]    Frequency of Communication with Friends and Family: Once a week    Frequency of Social Gatherings with Friends and Family: Never    Attends Religious Services: Patient  declined    Database administrator or Organizations: No    Attends Engineer, structural: Not on file    Marital Status: Married    Allergies: No Known Allergies  Metabolic Disorder Labs: No results found for: "HGBA1C", "MPG" No results found for: "PROLACTIN" Lab Results  Component Value Date   CHOL 221 (H) 09/25/2022   TRIG 102.0 09/25/2022   HDL 76.50 09/25/2022   CHOLHDL 3 09/25/2022   VLDL 20.4 09/25/2022   LDLCALC 124 (H) 09/25/2022   Lab Results  Component Value Date   TSH 1.31 11/21/2022   TSH 0.63 09/25/2022    Therapeutic Level Labs: No results found for: "LITHIUM" No results found for: "VALPROATE" No results found for: "CBMZ"  Current Medications: Current Outpatient Medications  Medication Sig Dispense Refill   acetaminophen (TYLENOL) 325 MG tablet Take 650 mg by mouth every 6 (six) hours as needed.     carbamazepine (TEGRETOL) 100 MG chewable tablet Chew 1 tablet (100 mg total) by mouth 2 (two) times daily. 60 tablet 1   celecoxib (CELEBREX) 200 MG capsule Take 1 capsule (200 mg total) by mouth 2 (two) times daily. 180 capsule 3   ciclopirox (LOPROX) 0.77 % cream Apply topically 2 (two) times daily. 90 g 2   [START ON 04/08/2023] clonazePAM (KLONOPIN) 2 MG tablet Take 1 tablet (2 mg total) by mouth at bedtime. 30 tablet 1   cyclobenzaprine (FLEXERIL) 10 MG tablet Take 1 tablet (10 mg total) by mouth daily as needed  for muscle spasms. 30 tablet 2   diclofenac Sodium (VOLTAREN) 1 % GEL Apply 4 g topically 4 (four) times daily as needed. 100 g 3   famotidine (PEPCID) 20 MG tablet Take 20 mg by mouth 2 (two) times daily.     famotidine-calcium carbonate-magnesium hydroxide (PEPCID COMPLETE) 10-800-165 MG chewable tablet Chew 1 tablet by mouth daily as needed. 100 tablet 11   gabapentin (NEURONTIN) 300 MG capsule Take 1 capsule (300 mg total) by mouth 3 (three) times daily. 270 capsule 3   HYDROcodone-acetaminophen (NORCO) 10-325 MG tablet Take 1 tablet by  mouth every 8 (eight) hours as needed for up to 5 days. 15 tablet 0   lisinopril-hydrochlorothiazide (ZESTORETIC) 10-12.5 MG tablet Take 1 tablet by mouth daily. 30 tablet 1   mirtazapine (REMERON) 7.5 MG tablet TAKE 1 TABLET BY MOUTH EVERYDAY AT BEDTIME 90 tablet 1   ondansetron (ZOFRAN ODT) 4 MG disintegrating tablet Take 1 tablet (4 mg total) by mouth every 8 (eight) hours as needed for nausea or vomiting. 20 tablet 0   ondansetron (ZOFRAN) 4 MG tablet Take 1 tablet (4 mg total) by mouth every 8 (eight) hours as needed for nausea or vomiting. 20 tablet 0   pantoprazole (PROTONIX) 40 MG tablet TAKE 1 TABLET (40 MG TOTAL) BY MOUTH TWICE A DAY BEFORE MEALS 180 tablet 1   rosuvastatin (CRESTOR) 10 MG tablet Take 1 tablet (10 mg total) by mouth daily for 30 days, THEN 2 tablets (20 mg total) daily. 90 tablet 3   sucralfate (CARAFATE) 1 g tablet Take 1 g by mouth 3 (three) times daily.     No current facility-administered medications for this visit.     Musculoskeletal: Strength & Muscle Tone: within normal limits Gait & Station: normal Patient leans: N/A  Psychiatric Specialty Exam: Review of Systems  Psychiatric/Behavioral:  Positive for dysphoric mood. Negative for decreased concentration, hallucinations, self-injury, sleep disturbance and suicidal ideas. The patient is nervous/anxious. The patient is not hyperactive.     Blood pressure (!) 142/79, pulse 87, temperature 98 F (36.7 C), temperature source Oral, height 5\' 3"  (1.6 m), weight 139 lb (63 kg), last menstrual period 12/20/2011, SpO2 100%.Body mass index is 24.62 kg/m.  General Appearance: Casual  Eye Contact:  Good  Speech:  Clear and Coherent and Normal Rate  Volume:  Normal  Mood:  Anxious and Dysphoric  Affect:  Appropriate  Thought Process:  Coherent, Goal Directed, and Descriptions of Associations: Intact  Orientation:  Full (Time, Place, and Person)  Thought Content: WDL   Suicidal Thoughts:  No  Homicidal Thoughts:   No  Memory:  Immediate;   Good Recent;   Good Remote;   Good  Judgement:  Good  Insight:  Good  Psychomotor Activity:  Normal  Concentration:  Concentration: Good and Attention Span: Good  Recall:  Good  Fund of Knowledge: Good  Language: Good  Akathisia:  No  Handed:  Right  AIMS (if indicated): not done  Assets:  Communication Skills Desire for Improvement Housing Social Support Transportation Vocational/Educational  ADL's:  Intact  Cognition: WNL  Sleep:  Good   Screenings: GAD-7    Flowsheet Row Clinical Support from 03/20/2023 in [facility] Office Visit from 03/05/2023 in [facility] Visit from 01/31/2023 in [facility] Clinical Support from 01/16/2023 in [facility] Office Visit from 12/25/2022 in [facility]  Total GAD-7 Score 18 8 17 9  14  PHQ2-9    Flowsheet Row Clinical Support from 03/20/2023 in [facility] Office Visit from 03/05/2023 in [facility] Office Visit from 01[facility]31/2023 in [facility] Clinical Support from 01/16/2023 in [facility] Office Visit from 12/25/2022 in [facility]  PHQ-2 Total Score 2 2 5 1 4   PHQ-9 Total Score 6 7 14  -- 13      Flowsheet Row Clinical Support from 03/20/2023 in [facility] Clinical Support from 01/16/2023 in [facility] Clinical Support from 12/05/2022 in [facility]  C-SSRS RISK CATEGORY No Risk No Risk No Risk        Assessment and Plan:   Amanda White is a 57 year old female with a past psychiatric history significant for generalized anxiety disorder and major depressive disorder who presents to [facility] for follow-up and medication  management.  Patient presents today encounter endorsing several stressors related to issues within her family.  Although patient continues to take her medications regularly, she reports that she continues to experience some depressive symptoms.  Although patient denies the need for dosage adjustments at this time, she is interested in being set up with a therapist so she can talk about her issues.  Patient to be set up with a licensed clinical social worker following the conclusion of the encounter.  Patient to continue taking her medications as prescribed.  Patient's medications to be e-prescribed to pharmacy of choice.  Collaboration of Care: Collaboration of Care: Medication Management AEB provider managing patient's psychiatric medications, Primary Care Provider AEB patient being seen by her primary care provider, Psychiatrist AEB patient being seen by mental health provider at this facility, and Other provider involved in patient's care AEB patient being seen by neurology, cardiology, and OB/GYN  Patient/Guardian was advised Release of Information must be obtained prior to any record release in order to collaborate their care with an outside provider. Patient/Guardian was advised if they have not already done so to contact the registration department to sign all necessary forms in order for [place] to release information regarding their care.   Consent: Patient/Guardian gives verbal consent for treatment and assignment of benefits for services provided during this visit. Patient/Guardian expressed understanding and agreed to proceed.   1. Generalized anxiety disorder Patient to continue taking mirtazapine 7.5 mg at bedtime for the management of her major depressive disorder  - carbamazepine (TEGRETOL) 100 MG chewable tablet; Chew 1 tablet (100 mg total) by mouth 2 (two) times daily.  Dispense: 60 tablet; Refill: 1 - clonazePAM (KLONOPIN) 2 MG tablet; Take 1 tablet (2 mg total) by mouth at bedtime.   Dispense: 30 tablet; Refill: 1  2. Moderate episode of recurrent major depressive disorder (HCC) Patient to continue taking mirtazapine 7.5 mg at bedtime for the management of her major depressive disorder  3. Long-term current use of benzodiazepine  Patient to follow-up in 2 months Provider spent a total of 34 minutes with the patient/reviewing patient's chart  Meta Hatchet, PA 03/20/2023, 9:59 PM

## 2023-03-21 ENCOUNTER — Encounter: Payer: Medicaid Other | Admitting: Podiatry

## 2023-03-27 DIAGNOSIS — Z1211 Encounter for screening for malignant neoplasm of colon: Secondary | ICD-10-CM | POA: Diagnosis not present

## 2023-03-27 DIAGNOSIS — K253 Acute gastric ulcer without hemorrhage or perforation: Secondary | ICD-10-CM | POA: Diagnosis not present

## 2023-03-27 DIAGNOSIS — Z419 Encounter for procedure for purposes other than remedying health state, unspecified: Secondary | ICD-10-CM | POA: Diagnosis not present

## 2023-03-28 ENCOUNTER — Encounter: Payer: Self-pay | Admitting: Podiatry

## 2023-03-28 ENCOUNTER — Ambulatory Visit (INDEPENDENT_AMBULATORY_CARE_PROVIDER_SITE_OTHER): Payer: Medicaid Other | Admitting: Podiatry

## 2023-03-28 ENCOUNTER — Ambulatory Visit (INDEPENDENT_AMBULATORY_CARE_PROVIDER_SITE_OTHER): Payer: Medicaid Other

## 2023-03-28 VITALS — Ht 63.0 in | Wt 139.0 lb

## 2023-03-28 DIAGNOSIS — M2021 Hallux rigidus, right foot: Secondary | ICD-10-CM | POA: Diagnosis not present

## 2023-03-28 DIAGNOSIS — Z9889 Other specified postprocedural states: Secondary | ICD-10-CM

## 2023-03-28 NOTE — Progress Notes (Signed)
Subjective:   Patient ID: Amanda White, female   DOB: 57 y.o.   MRN: [id number]   HPI Patient states doing well with surgery very pleased so far   ROS      Objective:  Physical Exam  Neurovascular status intact inflammation that is reducing around the first MPJ still moderate swelling but improving all the time with good range of motion     Assessment:  Doing well post osteotomy right first metatarsal     Plan:  H&P reviewed discussed condition and recommended conservative treatment and continued elevation compression gradual reduction and immobilization and range of motion.  Reappoint 6 weeks earlier if necessary with ankle compression stocking dispense  X-rays indicate osteotomies healing good joint congruence fixation in place

## 2023-04-05 ENCOUNTER — Other Ambulatory Visit: Payer: Self-pay | Admitting: Internal Medicine

## 2023-04-09 DIAGNOSIS — K259 Gastric ulcer, unspecified as acute or chronic, without hemorrhage or perforation: Secondary | ICD-10-CM | POA: Diagnosis not present

## 2023-04-10 ENCOUNTER — Other Ambulatory Visit (HOSPITAL_BASED_OUTPATIENT_CLINIC_OR_DEPARTMENT_OTHER): Payer: Self-pay | Admitting: Cardiology

## 2023-04-10 DIAGNOSIS — E7800 Pure hypercholesterolemia, unspecified: Secondary | ICD-10-CM

## 2023-04-10 DIAGNOSIS — I700 Atherosclerosis of aorta: Secondary | ICD-10-CM

## 2023-04-13 ENCOUNTER — Ambulatory Visit: Payer: Medicaid Other

## 2023-04-25 ENCOUNTER — Ambulatory Visit: Payer: Medicaid Other | Admitting: Internal Medicine

## 2023-04-25 NOTE — Assessment & Plan Note (Deleted)
Following with behavioral health counselor

## 2023-04-25 NOTE — Assessment & Plan Note (Deleted)
At last visit Her symptoms of nausea and burning sensation had resolved, and she is currently on Omeprazole 40mg  daily due to a history of stress ulcers and recent weight gain. We will continue Omeprazole 40mg  daily, consider adding Aspirin for aortic atherosclerosis after surgery, and an endoscopy was scheduled for October but seems to be delayed now

## 2023-04-25 NOTE — Assessment & Plan Note (Deleted)
At last visit: Aortic atherosclerosis was identified on a CT scan during a cancer workup, and she is currently on Rosuvastatin. We will continue Rosuvastatin and consider adding Aspirin after surgery.

## 2023-04-25 NOTE — Assessment & Plan Note (Deleted)
Reviewed plan from Dr. Charlsie Merles podiatry : continue immobilization dispensed surgical shoe explained range of motion exercises and gradual increase in weightbearing reappoint 4 weeks X-rays indicate excellent healing osteotomy fixation in place joint congruence

## 2023-04-25 NOTE — Assessment & Plan Note (Deleted)
Wt Readings from Last 10 Encounters:  03/28/23 139 lb (63 kg)  03/05/23 136 lb 3.2 oz (61.8 kg)  02/02/23 129 lb 6.4 oz (58.7 kg)  01/31/23 132 lb 3.2 oz (60 kg)  12/25/22 129 lb 6.4 oz (58.7 kg)  12/05/22 128 lb 1.6 oz (58.1 kg)  11/28/22 125 lb 6.4 oz (56.9 kg)  11/24/22 125 lb 6.4 oz (56.9 kg)  11/16/22 125 lb 11.2 oz (57 kg)  10/25/22 125 lb 6.4 oz (56.9 kg)    We continue to monitor but cancer workup negative and this is now presumed due to peptic ulcer disease

## 2023-04-25 NOTE — Progress Notes (Deleted)
[facility]: [phone]   -- Medical Office Visit --  Patient:  Amanda White      Age: 57 y.o.       Sex:  female  Date:   04/25/2023 Today's Healthcare Provider: Lula Olszewski, MD  ============================================================================================= Assessment Plan    Assessment & Plan Hallux limitus of right foot Reviewed plan from Dr. Charlsie Merles podiatry : continue immobilization dispensed surgical shoe explained range of motion exercises and gradual increase in weightbearing reappoint 4 weeks X-rays indicate excellent healing osteotomy fixation in place joint congruence Depression, unspecified depression type  Generalized anxiety disorder Following with behavioral health counselor  Long-term current use of benzodiazepine  Elevated AFP  High gamma glutamyl transferase (GGT) At last visit Couldn't find the labs for this but noted history mildly elevated AFP will plan recheck No results found for: "AFP"  Aortic atherosclerosis (HCC) At last visit: Aortic atherosclerosis was identified on a CT scan during a cancer workup, and she is currently on Rosuvastatin. We will continue Rosuvastatin and consider adding Aspirin after surgery. History of peptic ulcer At last visit Her symptoms of nausea and burning sensation had resolved, and she is currently on Omeprazole 40mg  daily due to a history of stress ulcers and recent weight gain. We will continue Omeprazole 40mg  daily, consider adding Aspirin for aortic atherosclerosis after surgery, and an endoscopy was scheduled for October but seems to be delayed now History of weight loss Wt Readings from Last 10 Encounters:  03/28/23 139 lb (63 kg)  03/05/23 136 lb 3.2 oz (61.8 kg)  02/02/23 129 lb 6.4 oz (58.7 kg)  01/31/23 132 lb 3.2 oz (60 kg)  12/25/22 129 lb 6.4 oz (58.7 kg)  12/05/22 128 lb 1.6 oz (58.1 kg)  11/28/22 125 lb 6.4 oz (56.9 kg)  11/24/22 125 lb 6.4 oz (56.9 kg)   11/16/22 125 lb 11.2 oz (57 kg)  10/25/22 125 lb 6.4 oz (56.9 kg)    We continue to monitor but cancer workup negative and this is now presumed due to peptic ulcer disease   Statin intolerance  Assessment and Plan            ED Discharge Orders     None     Diagnoses and all orders for this visit: Hallux limitus of right foot Depression, unspecified depression type Generalized anxiety disorder Long-term current use of benzodiazepine Elevated AFP High gamma glutamyl transferase (GGT) Aortic atherosclerosis (HCC) History of peptic ulcer History of weight loss Statin intolerance  Recommended follow-up: No follow-ups on file. Future Appointments  Date Time Provider Department Center  04/25/2023  9:00 AM Lula Olszewski, MD [facility]  05/09/2023  1:45 PM Lenn Sink, DPM [facility]  05/10/2023  7:50 AM [facility] 2 [facility]  05/22/2023  3:30 PM Meta Hatchet, PA [facility] None  05/31/2023 11:00 AM Weber Cooks, LCSW [facility] None  Patient Care Team: Lula Olszewski, MD as PCP - General (Internal Medicine) Jodelle Red, MD as PCP - Cardiology (Cardiology) Fritzi Mandes, MD as Consulting Physician (General Surgery) Autry-Lott, Randa Evens, DO as Resident (Family Medicine) Altamese Granada, MD as Consulting Physician (Endocrinology) Sanjuana Kava, NP as Nurse Practitioner (Psychiatry) Sanjuana Kava, NP as Nurse Practitioner (Psychiatry)    Subjective   57 y.o. female who has Hyperlipidemia; Benzodiazepine dependence (HCC); GERD (gastroesophageal reflux disease); Lumbago of lumbar region with sciatica; Podagra; Hypertension; Generalized anxiety disorder; Memory impairment; Subcutaneous nodule; RUQ abdominal tenderness; Cystic disease of  liver; Right-sided chest pain; Cyst of gallbladder; Aortic atherosclerosis (HCC); History of colon polyps; DDD (degenerative disc disease), thoracolumbar; Moderate episode of recurrent major  depressive disorder (HCC); Long-term current use of benzodiazepine; PUD (peptic ulcer disease); Fecal retention; High gamma glutamyl transferase (GGT); Arthritis of first metatarsophalangeal (MTP) joint of right foot; Onychomycosis; Family history of rheumatoid arthritis; History of weight loss; Hallux limitus of right foot; Statin intolerance; Pericardial effusion; and History of peptic ulcer on their problem list.. Main reasons for visit/main concerns/chief complaint: No chief complaint on file.   ------------------------------------------------------------------------------------------------------------------------ AI-Extracted: Discussed the use of AI scribe software for clinical note transcription with the patient, who gave verbal consent to proceed.  History of Present Illness           Note that patient  has a past medical history of Abnormal cardiac CT angiography (10/12/2022), Anxiety, Bipolar 1 disorder (HCC), Carpal tunnel syndrome (09/25/2022), DDD (degenerative disc disease), thoracolumbar (10/25/2022), Depression, Depression, Dizziness (10/09/2022), GERD (gastroesophageal reflux disease), History of weight loss (12/25/2022), Hypertension, LFT elevation (12/25/2022), Migraine, Pressure ulcer (11/28/2022), Subcutaneous nodule (09/25/2022), and Unintentional weight loss (09/25/2022).  Problem list overviews that were updated at today's visit: Problem  History of Peptic Ulcer  Hallux Limitus of Right Foot   Following with podiatry who stated Neurovascular status found to be intact muscle strength found to be adequate range of motion adequate with exquisite discomfort in the first MPJ right swelling around the joint mild around the left but not to the same degree with patient found to have good digital perfusion well-oriented x 3 Hallux limitus deformity right with acute inflammation moderate bunion deformity left H&P both conditions reviewed and for the right I did discuss the  probability at 1 point for biplanar osteotomy and the possibility someday for fusion or joint implantation.  She cannot do this now and I want to know that her other problems are under control so I went ahead today I did sterile prep I did periarticular injection around the first MPJ 3 mg Kenalog Marcaine mixture tolerated well and will be seen back to recheck X-rays indicate bone spurs around the first MPJ right narrowing of the joint surface with moderate elevation of the first metatarsal segment consistent with hallux limitus deformity  Surgery completed   High Gamma Glutamyl Transferase (Ggt)   No results found for: "LABGGT"    History of Weight Loss   Previous 56-pound weight loss investigated with multiple tests including CT and MRI without clear etiology identified (but peptic ulcer disease most likely culprit). Weight now stabilized with diet of smoothies and fresh foods. Continue to monitor and encourage current dietary approach.   Aortic Atherosclerosis (Hcc)   Previously diagnosed. Recent echocardiogram shows normal aortic valve structure and function. Cardiologist reportedly per patient report  not overly concerned. Continue monitoring as part of overall cardiovascular health management. Rosuvastatin much less myalgia than atorvastatin Aspirin held off due to ulcer but she will try soon  Jodelle Red, MD  Date:  12/05/2022  Cardiology Office Note:  .   Chynna LATORIA LIZCANO is a 57 y.o. female with PMH gastric ulcer, unintentional weight loss, hypertension, aortic atherosclerosis who is referred for cardiology consultation for evaluation of aortic atherosclerosis at the request of Dr. Jon Billings. Pertinent CV history: aortic atherosclerosis on CT 09/2022. Well controlled hypertension.  Today: Lost 56 lbs unintentionally, now starting to slowly gain weight back. No clear etiology. Under significant stress. Here to discuss plaque seen on her CT.  No prior heart issues. Has had  well  controlled blood pressure for years, unless she is in pain or stressed.   On atorvastatin for years, had myalgias before but worse on statin, especially increased dose. Worse at night and first thing in the morning. LDL was 124 in April, increased from 20 mg to 40 mg at that time.  Mother had history of MI and CVA, died age 9 of cardiac arrest (came in with pneumonia, was in the hospital for over a month before she passed). Maternal aunt died of MI. Maternal grandfather and all 5 of his brothers had heart issues.  EKG:  NSR at 79 bpm CT angio 10/12/22:  There is minimal aortic atherosclerosis. 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 There is a small amount of intermediate see pericardial fluid versus thickening. The heart size is normal. ASSESSMENT AND PLAN: .   Aortic atherosclerosis Family history of heart disease Hypercholesterolemia Statin myalgia -no aspirin given gastric ulcer -now on atorvastatin, though has muscle aches. Changing to rosuvastatin today. Will start at 10 mg daily dose, increase to 20 mg dose if well tolerated. Recheck lipids/LFTs in 3 mos. -reviewed test results today   Hypertension -well controlled, continue current meds.   CV risk counseling and prevention -recommend heart healthy/Mediterranean diet, with whole grains, fruits, vegetable, fish, lean meats, nuts, and olive oil. Limit salt. -recommend moderate walking, 3-5 times/week for 30-50 minutes each session. Aim for at least 150 minutes.week. Goal should be pace of 3 miles/hours, or walking 1.5 miles in 30 minutes -recommend avoidance of tobacco products. Avoid excess alcohol. -ASCVD risk score: The 10-year ASCVD risk score (Arnett DK, et al., 2019) is: 2%   Values used to calculate the score:     Age: 83 years     Sex: Female     Is Non-Hispanic African American: No     Diabetic: No     Tobacco smoker: No     Systolic Blood Pressure: 110 mmHg     Is BP treated: Yes     HDL  Cholesterol: 76.5 mg/dL     Total Cholesterol: 221 mg/dL     Dispo: 1 year or sooner as needed   Generalized Anxiety Disorder   Generalized anxiety disorder (ICD-10: F41.1) Patient reports significant anxiety, including episodes of feeling unable to breathe and sensation of pressure on chest. May be contributing to or exacerbating other symptoms. Review current medication efficacy and consider adjustments or additional management strategies.  Following with psychiatry: Dr. Elsie Saas  Prior history: Gets really nervous with arguing or with home purchasing Long term Klonopin     Med reconciliation: Current Outpatient Medications on File Prior to Visit  Medication Sig   acetaminophen (TYLENOL) 325 MG tablet Take 650 mg by mouth every 6 (six) hours as needed.   carbamazepine (TEGRETOL) 100 MG chewable tablet Chew 1 tablet (100 mg total) by mouth 2 (two) times daily.   celecoxib (CELEBREX) 200 MG capsule Take 1 capsule (200 mg total) by mouth 2 (two) times daily.   ciclopirox (LOPROX) 0.77 % cream Apply topically 2 (two) times daily.   clonazePAM (KLONOPIN) 2 MG tablet Take 1 tablet (2 mg total) by mouth at bedtime.   cyclobenzaprine (FLEXERIL) 10 MG tablet Take 1 tablet (10 mg total) by mouth daily as needed for muscle spasms.   diclofenac Sodium (VOLTAREN) 1 % GEL Apply 4 g topically 4 (four) times daily as needed.   famotidine (PEPCID) 20 MG tablet Take 20 mg by mouth 2 (two) times daily.  famotidine-calcium carbonate-magnesium hydroxide (PEPCID COMPLETE) 10-800-165 MG chewable tablet Chew 1 tablet by mouth daily as needed.   gabapentin (NEURONTIN) 300 MG capsule Take 1 capsule (300 mg total) by mouth 3 (three) times daily.   lisinopril-hydrochlorothiazide (ZESTORETIC) 10-12.5 MG tablet TAKE 1 TABLET BY MOUTH EVERY DAY   mirtazapine (REMERON) 7.5 MG tablet TAKE 1 TABLET BY MOUTH EVERYDAY AT BEDTIME   ondansetron (ZOFRAN ODT) 4 MG disintegrating tablet Take 1 tablet (4 mg total) by mouth  every 8 (eight) hours as needed for nausea or vomiting.   ondansetron (ZOFRAN) 4 MG tablet Take 1 tablet (4 mg total) by mouth every 8 (eight) hours as needed for nausea or vomiting.   pantoprazole (PROTONIX) 40 MG tablet TAKE 1 TABLET (40 MG TOTAL) BY MOUTH TWICE A DAY BEFORE MEALS   rosuvastatin (CRESTOR) 10 MG tablet TAKE 1 TABLET (10 MG TOTAL) BY MOUTH DAILY FOR 30 DAYS, THEN 2 TABLETS (20 MG TOTAL) DAILY.   sucralfate (CARAFATE) 1 g tablet Take 1 g by mouth 3 (three) times daily.   No current facility-administered medications on file prior to visit.  There are no discontinued medications.   Objective   Physical Exam  LMP 12/20/2011  Wt Readings from Last 10 Encounters:  03/28/23 139 lb (63 kg)  03/05/23 136 lb 3.2 oz (61.8 kg)  02/02/23 129 lb 6.4 oz (58.7 kg)  01/31/23 132 lb 3.2 oz (60 kg)  12/25/22 129 lb 6.4 oz (58.7 kg)  12/05/22 128 lb 1.6 oz (58.1 kg)  11/28/22 125 lb 6.4 oz (56.9 kg)  11/24/22 125 lb 6.4 oz (56.9 kg)  11/16/22 125 lb 11.2 oz (57 kg)  10/25/22 125 lb 6.4 oz (56.9 kg)   Vital signs reviewed.  Nursing notes reviewed. Weight trend reviewed. Abnormalities and Problem-Specific physical exam findings:  ***  General Appearance:  No acute distress appreciable.   Well-groomed, healthy-appearing female.  Well proportioned with no abnormal fat distribution.  Good muscle tone. Pulmonary:  Normal work of breathing at rest, no respiratory distress apparent.    Musculoskeletal: All extremities are intact.  Neurological:  Awake, alert, oriented, and engaged.  No obvious focal neurological deficits or cognitive impairments.  Sensorium seems unclouded.   Speech is clear and coherent with logical content. Psychiatric:  Appropriate mood, pleasant and cooperative demeanor, thoughtful and engaged during the exam  Results         {Insert previous labs (optional):23779} {See past labs  Heme  Chem  Endocrine  Serology  Results Review (optional):1}  No results found for  any visits on 04/25/23.  Appointment on 02/14/2023  Component Date Value   S' Lateral 02/14/2023 2.93    Area-P 1/2 02/14/2023 3.85    MV M vel 02/14/2023 4.59    MV Peak grad 02/14/2023 84.3    Est EF 02/14/2023 55 - 60%   Office Visit on 12/25/2022  Component Date Value   Uric Acid, Serum 12/25/2022 3.8    Sed Rate 12/25/2022 11    CRP 12/25/2022 <1.0    Sodium 12/25/2022 140    Potassium 12/25/2022 4.6    Chloride 12/25/2022 99    CO2 12/25/2022 34 (H)    Glucose, Bld 12/25/2022 94    BUN 12/25/2022 14    Creatinine, Ser 12/25/2022 0.65    Total Bilirubin 12/25/2022 0.4    Alkaline Phosphatase 12/25/2022 50    AST 12/25/2022 25    ALT 12/25/2022 28    Total Protein 12/25/2022 7.4    Albumin 12/25/2022  4.8    GFR 12/25/2022 97.31    Calcium 12/25/2022 10.7 (H)    AFP-Tumor Marker 12/25/2022 9.6 (H)    Cortisol, Plasma 12/25/2022 9.4    C206 ACTH 12/25/2022 14   Office Visit on 11/28/2022  Component Date Value   H pylori Ag, Stl 11/30/2022 Negative   Lab on 11/21/2022  Component Date Value   Sodium 11/21/2022 136    Potassium 11/21/2022 4.1    Chloride 11/21/2022 97    CO2 11/21/2022 30    Glucose, Bld 11/21/2022 93    BUN 11/21/2022 15    Creatinine, Ser 11/21/2022 0.72    GFR 11/21/2022 92.47    Calcium 11/21/2022 9.9    TSH 11/21/2022 1.31    Free T4 11/21/2022 0.76   Office Visit on 11/16/2022  Component Date Value   High risk HPV 11/16/2022 Negative    Adequacy 11/16/2022 Satisfactory for evaluation; transformation zone component PRESENT.    Diagnosis 11/16/2022 - Negative for intraepithelial lesion or malignancy (NILM)    Comment 11/16/2022 Atrophic changes are present.    Comment 11/16/2022 Normal Reference Range HPV - Negative    Glucose, UA 11/16/2022 NEGATIVE    Bilirubin Urine 11/16/2022 NEGATIVE    Ketones, ur 11/16/2022 NEGATIVE    Specific Gravity, Urine 11/16/2022 1.010    Hgb urine dipstick 11/16/2022 NEGATIVE    pH 11/16/2022 7.0     Protein, ur 11/16/2022 NEGATIVE    Urobilinogen, UA 11/16/2022 0.2    Nitrite 11/16/2022 NEGATIVE    Leukocytes,Ua 11/16/2022 NEGATIVE   Abstract on 11/08/2022  Component Date Value   HM Colonoscopy 05/10/2021 See Report (in chart)   Scanned Document on 10/11/2022  Component Date Value   HM Colonoscopy 05/25/2021 See Report (in chart)   Office Visit on 09/25/2022  Component Date Value   Hepatitis C Ab 09/25/2022 NON-REACTIVE    HIV 1&2 Ab, 4th Generati* 09/25/2022 NON-REACTIVE    Cholesterol 09/25/2022 221 (H)    Triglycerides 09/25/2022 102.0    HDL 09/25/2022 76.50    VLDL 09/25/2022 20.4    LDL Cholesterol 09/25/2022 124 (H)    Total CHOL/HDL Ratio 09/25/2022 3    NonHDL 09/25/2022 144.80    WBC 09/25/2022 7.2    RBC 09/25/2022 3.96    Platelets 09/25/2022 423.0 (H)    Hemoglobin 09/25/2022 13.0    HCT 09/25/2022 37.1    MCV 09/25/2022 93.8    MCHC 09/25/2022 34.9    RDW 09/25/2022 13.0    Sodium 09/25/2022 138    Potassium 09/25/2022 3.6    Chloride 09/25/2022 98    CO2 09/25/2022 30    Glucose, Bld 09/25/2022 96    BUN 09/25/2022 17    Creatinine, Ser 09/25/2022 0.64    Total Bilirubin 09/25/2022 0.7    Alkaline Phosphatase 09/25/2022 47    AST 09/25/2022 40 (H)    ALT 09/25/2022 99 (H)    Total Protein 09/25/2022 7.2    Albumin 09/25/2022 4.9    GFR 09/25/2022 97.85    Calcium 09/25/2022 10.1    Cyclic Citrullin Peptide* 09/25/2022 <16    Rheumatoid fact SerPl-aC* 09/25/2022 <14    Anti Nuclear Antibody (A* 09/25/2022 NEGATIVE    VITD 09/25/2022 26.68 (L)    Vitamin B-12 09/25/2022 306    Folate 09/25/2022 11.2    ANCA SCREEN 09/25/2022 Negative    T3 Uptake 09/25/2022 33    T4, Total 09/25/2022 7.7    Free Thyroxine Index 09/25/2022 2.5    TSH 09/25/2022 0.63  No image results found.   DG Foot 2 Views Right  Result Date: 03/28/2023 Please see detailed radiograph report in office note.  DG Foot 2 Views Right  Result Date: 03/14/2023 Please see  detailed radiograph report in office note.  ECHOCARDIOGRAM COMPLETE  Result Date: 02/14/2023    ECHOCARDIOGRAM REPORT   Patient Name:   ANWITHA HANLAN Date of Exam: 02/14/2023 Medical Rec #:  [id number]          Height:       63.0 in Accession #:    [id number]         Weight:       129.4 lb Date of Birth:  [birth year]          BSA:          1.607 m Patient Age:    57 years           BP:           110/60 mmHg Patient Gender: F                  HR:           88 bpm. Exam Location:  Outpatient Procedure: 2D Echo, 3D Echo, Color Doppler, Cardiac Doppler and Strain Analysis Indications:    Pericardial Effusion  History:        Patient has no prior history of Echocardiogram examinations.                 Risk Factors:Hypertension, Non-Smoker and Dyslipidemia.  Sonographer:    Jeryl Columbia RDCS Referring Phys: [id number] Lula Olszewski  Sonographer Comments: Patient denies any unusual shortness of breath. IMPRESSIONS  1. Left ventricular ejection fraction, by estimation, is 55 to 60%. The left ventricle has normal function. The left ventricle has no regional wall motion abnormalities. Left ventricular diastolic parameters were normal.  2. Right ventricular systolic function is normal. The right ventricular size is normal. There is normal pulmonary artery systolic pressure. The estimated right ventricular systolic pressure is 30.0 mmHg.  3. A small pericardial effusion is present. The pericardial effusion is circumferential. There is no evidence of cardiac tamponade.  4. The mitral valve is normal in structure. No evidence of mitral valve regurgitation. No evidence of mitral stenosis.  5. The aortic valve is normal in structure. Aortic valve regurgitation is not visualized. No aortic stenosis is present.  6. The inferior vena cava is normal in size with greater than 50% respiratory variability, suggesting right atrial pressure of 3 mmHg. Comparison(s): 10/12/22 CT There is a small amount of intermediate see pericardial  fluid versus thickening.  FINDINGS  Left Ventricle: Left ventricular ejection fraction, by estimation, is 55 to 60%. The left ventricle has normal function. The left ventricle has no regional wall motion abnormalities. Global longitudinal strain performed but not reported based on interpreter judgement due to suboptimal tracking. 3D ejection fraction reviewed and evaluated as part of the interpretation. Alternate measurement of EF is felt to be most reflective of LV function. The left ventricular internal cavity size was normal in  size. There is no left ventricular hypertrophy. Left ventricular diastolic parameters were normal. Normal left ventricular filling pressure. Right Ventricle: The right ventricular size is normal. No increase in right ventricular wall thickness. Right ventricular systolic function is normal. There is normal pulmonary artery systolic pressure. The tricuspid regurgitant velocity is 2.60 m/s, and  with an assumed right atrial pressure of 3 mmHg, the estimated right ventricular systolic pressure is 30.0  mmHg. Left Atrium: Left atrial size was normal in size. Right Atrium: Right atrial size was normal in size. Pericardium: A small pericardial effusion is present. The pericardial effusion is circumferential. There is no evidence of cardiac tamponade. Mitral Valve: The mitral valve is normal in structure. No evidence of mitral valve regurgitation. No evidence of mitral valve stenosis. Tricuspid Valve: The tricuspid valve is normal in structure. Tricuspid valve regurgitation is mild . No evidence of tricuspid stenosis. Aortic Valve: The aortic valve is normal in structure. Aortic valve regurgitation is not visualized. No aortic stenosis is present. Pulmonic Valve: The pulmonic valve was normal in structure. Pulmonic valve regurgitation is not visualized. No evidence of pulmonic stenosis. Aorta: The aortic root is normal in size and structure. Venous: The inferior vena cava is normal in size with  greater than 50% respiratory variability, suggesting right atrial pressure of 3 mmHg. IAS/Shunts: No atrial level shunt detected by color flow Doppler.  LEFT VENTRICLE PLAX 2D LVIDd:         4.04 cm   Diastology LVIDs:         2.93 cm   LV e' medial:    9.46 cm/s LV PW:         0.95 cm   LV E/e' medial:  9.0 LV IVS:        0.68 cm   LV e' lateral:   12.50 cm/s LVOT diam:     2.00 cm   LV E/e' lateral: 6.8 LV SV:         66 LV SV Index:   41 LVOT Area:     3.14 cm                           3D Volume EF:                          3D EF:        46 %                          LV EDV:       83 ml                          LV ESV:       45 ml                          LV SV:        38 ml RIGHT VENTRICLE RV Basal diam:  3.19 cm RV Mid diam:    2.36 cm RV S prime:     14.80 cm/s TAPSE (M-mode): 2.4 cm LEFT ATRIUM             Index        RIGHT ATRIUM           Index LA diam:        3.30 cm 2.05 cm/m   RA Area:     13.60 cm LA Vol (A2C):   44.4 ml 27.63 ml/m  RA Volume:   33.20 ml  20.66 ml/m LA Vol (A4C):   30.1 ml 18.73 ml/m LA Biplane Vol: 38.9 ml 24.21 ml/m  AORTIC VALVE LVOT Vmax:   116.00 cm/s LVOT Vmean:  76.200 cm/s LVOT VTI:    0.211 m  AORTA Ao Root diam: 2.60  cm Ao Asc diam:  3.10 cm MITRAL VALVE               TRICUSPID VALVE MV Area (PHT): 3.85 cm    TR Peak grad:   27.0 mmHg MV Decel Time: 197 msec    TR Vmax:        260.00 cm/s MR Peak grad: 84.3 mmHg MR Vmax:      459.00 cm/s  SHUNTS MV E velocity: 85.00 cm/s  Systemic VTI:  0.21 m MV A velocity: 78.00 cm/s  Systemic Diam: 2.00 cm MV E/A ratio:  1.09 Armanda Magic MD Electronically signed by Armanda Magic MD Signature Date/Time: 02/14/2023/9:32:08 PM    Final     DG Foot Complete Right  Result Date: 12/30/2022 CLINICAL DATA:  Right metatarsophalangeal pain mainly near great toe metatarsophalangeal joint for 1 year. Intermittent and worsening last 2 months. EXAM: RIGHT FOOT COMPLETE - 3+ VIEW COMPARISON:  None Available. FINDINGS: Mild hallux valgus. Severe  lateral great toe metatarsophalangeal joint space narrowing bone-on-bone contact, subchondral sclerosis, and mild-to-moderate lateral and dorsal degenerative osteophytosis. Mild first tarsometatarsal lateral joint space narrowing and dorsal and lateral degenerative osteophytes. No acute fracture or dislocation. IMPRESSION: 1. Mild hallux valgus. Severe great toe metatarsophalangeal joint osteoarthritis. 2. Mild first tarsometatarsal osteoarthritis. Electronically Signed   By: Neita Garnet M.D.   On: 12/30/2022 14:24       Additional Info: This encounter employed real-time, collaborative documentation. The patient actively reviewed and updated their medical record on a shared screen, ensuring transparency and facilitating joint problem-solving for the problem list, overview, and plan. This approach promotes accurate, informed care. The treatment plan was discussed and reviewed in detail, including medication safety, potential side effects, and all patient questions. We confirmed understanding and comfort with the plan. Follow-up instructions were established, including contacting the office for any concerns, returning if symptoms worsen, persist, or new symptoms develop, and precautions for potential emergency department visits.

## 2023-04-25 NOTE — Assessment & Plan Note (Deleted)
At last visit Couldn't find the labs for this but noted history mildly elevated AFP will plan recheck No results found for: "AFP"

## 2023-04-25 NOTE — Assessment & Plan Note (Deleted)
At last visit: Generalized Anxiety Disorder Her generalized anxiety disorder is managed with Mirtazapine and Clonazepam, noted to be related to ulcer disease. We will continue current medications and follow up with a psychiatrist for further management.  We coordinated care with a message about the situation to Dr. Elsie Saas

## 2023-04-27 DIAGNOSIS — Z419 Encounter for procedure for purposes other than remedying health state, unspecified: Secondary | ICD-10-CM | POA: Diagnosis not present

## 2023-05-02 ENCOUNTER — Encounter: Payer: Self-pay | Admitting: Internal Medicine

## 2023-05-02 ENCOUNTER — Ambulatory Visit: Payer: Medicaid Other | Admitting: Internal Medicine

## 2023-05-02 VITALS — BP 128/70 | HR 74 | Temp 98.1°F | Ht 63.0 in | Wt 140.4 lb

## 2023-05-02 DIAGNOSIS — E559 Vitamin D deficiency, unspecified: Secondary | ICD-10-CM

## 2023-05-02 DIAGNOSIS — E663 Overweight: Secondary | ICD-10-CM | POA: Diagnosis not present

## 2023-05-02 DIAGNOSIS — K828 Other specified diseases of gallbladder: Secondary | ICD-10-CM | POA: Diagnosis not present

## 2023-05-02 DIAGNOSIS — F331 Major depressive disorder, recurrent, moderate: Secondary | ICD-10-CM | POA: Diagnosis not present

## 2023-05-02 DIAGNOSIS — R10811 Right upper quadrant abdominal tenderness: Secondary | ICD-10-CM

## 2023-05-02 DIAGNOSIS — G4709 Other insomnia: Secondary | ICD-10-CM | POA: Diagnosis not present

## 2023-05-02 DIAGNOSIS — E785 Hyperlipidemia, unspecified: Secondary | ICD-10-CM

## 2023-05-02 DIAGNOSIS — R748 Abnormal levels of other serum enzymes: Secondary | ICD-10-CM

## 2023-05-02 DIAGNOSIS — I10 Essential (primary) hypertension: Secondary | ICD-10-CM | POA: Diagnosis not present

## 2023-05-02 DIAGNOSIS — G479 Sleep disorder, unspecified: Secondary | ICD-10-CM | POA: Diagnosis not present

## 2023-05-02 DIAGNOSIS — R5383 Other fatigue: Secondary | ICD-10-CM | POA: Insufficient documentation

## 2023-05-02 LAB — CBC WITH DIFFERENTIAL/PLATELET
Basophils Absolute: 0.1 10*3/uL (ref 0.0–0.1)
Basophils Relative: 1 % (ref 0.0–3.0)
Eosinophils Absolute: 0.1 10*3/uL (ref 0.0–0.7)
Eosinophils Relative: 1.9 % (ref 0.0–5.0)
HCT: 32.7 % — ABNORMAL LOW (ref 36.0–46.0)
Hemoglobin: 10.9 g/dL — ABNORMAL LOW (ref 12.0–15.0)
Lymphocytes Relative: 29.8 % (ref 12.0–46.0)
Lymphs Abs: 1.8 10*3/uL (ref 0.7–4.0)
MCHC: 33.4 g/dL (ref 30.0–36.0)
MCV: 94.4 fL (ref 78.0–100.0)
Monocytes Absolute: 0.6 10*3/uL (ref 0.1–1.0)
Monocytes Relative: 9.6 % (ref 3.0–12.0)
Neutro Abs: 3.5 10*3/uL (ref 1.4–7.7)
Neutrophils Relative %: 57.7 % (ref 43.0–77.0)
Platelets: 370 10*3/uL (ref 150.0–400.0)
RBC: 3.47 Mil/uL — ABNORMAL LOW (ref 3.87–5.11)
RDW: 13.6 % (ref 11.5–15.5)
WBC: 6 10*3/uL (ref 4.0–10.5)

## 2023-05-02 LAB — COMPREHENSIVE METABOLIC PANEL
ALT: 16 U/L (ref 0–35)
AST: 19 U/L (ref 0–37)
Albumin: 4.6 g/dL (ref 3.5–5.2)
Alkaline Phosphatase: 49 U/L (ref 39–117)
BUN: 18 mg/dL (ref 6–23)
CO2: 33 meq/L — ABNORMAL HIGH (ref 19–32)
Calcium: 9.7 mg/dL (ref 8.4–10.5)
Chloride: 98 meq/L (ref 96–112)
Creatinine, Ser: 0.64 mg/dL (ref 0.40–1.20)
GFR: 97.44 mL/min (ref >60.00)
Glucose, Bld: 81 mg/dL (ref 70–99)
Potassium: 4.6 meq/L (ref 3.5–5.1)
Sodium: 136 meq/L (ref 135–145)
Total Bilirubin: 0.5 mg/dL (ref 0.2–1.2)
Total Protein: 7 g/dL (ref 6.0–8.3)

## 2023-05-02 LAB — LIPID PANEL
Cholesterol: 179 mg/dL (ref 0–200)
HDL: 74.3 mg/dL (ref >39.00)
LDL Cholesterol: 86 mg/dL (ref 0–99)
NonHDL: 104.33
Total CHOL/HDL Ratio: 2
Triglycerides: 93 mg/dL (ref 0.0–149.0)
VLDL: 18.6 mg/dL (ref 0.0–40.0)

## 2023-05-02 LAB — GAMMA GT: GGT: 38 U/L (ref 7–51)

## 2023-05-02 MED ORDER — SEMAGLUTIDE-WEIGHT MANAGEMENT 0.25 MG/0.5ML ~~LOC~~ SOAJ
0.2500 mg | SUBCUTANEOUS | 0 refills | Status: AC
Start: 2023-05-02 — End: 2023-05-30

## 2023-05-02 MED ORDER — SEMAGLUTIDE-WEIGHT MANAGEMENT 1 MG/0.5ML ~~LOC~~ SOAJ
1.0000 mg | SUBCUTANEOUS | 0 refills | Status: DC
Start: 2023-06-29 — End: 2023-06-18

## 2023-05-02 MED ORDER — SEMAGLUTIDE-WEIGHT MANAGEMENT 0.5 MG/0.5ML ~~LOC~~ SOAJ
0.5000 mg | SUBCUTANEOUS | 0 refills | Status: DC
Start: 2023-05-31 — End: 2023-06-18

## 2023-05-02 MED ORDER — SEMAGLUTIDE-WEIGHT MANAGEMENT 1.7 MG/0.75ML ~~LOC~~ SOAJ: 1.7000 mg | SUBCUTANEOUS | 0 refills | Status: DC

## 2023-05-02 MED ORDER — SEMAGLUTIDE-WEIGHT MANAGEMENT 2.4 MG/0.75ML ~~LOC~~ SOAJ: 2.4000 mg | SUBCUTANEOUS | 0 refills | Status: DC

## 2023-05-02 NOTE — Assessment & Plan Note (Signed)
Severe insomnia persists despite clonazepam use, likely exacerbating reported fatigue. Underlying psychiatric conditions such as PTSD and depression may contribute. We will consider a sleep study to assess sleep architecture and encourage the [employer] app use to explore potential sleep apnea. Discussion with a psychiatrist about adjusting psychiatric medications to enhance sleep quality and alleviate fatigue is advised.

## 2023-05-02 NOTE — Progress Notes (Signed)
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 Electronically Signed   By: Neita Garnet M.D.   On: 12/30/2022 14:24         Additional Info: This encounter employed real-time, collaborative documentation. The patient actively reviewed and updated their medical record on a shared screen, ensuring transparency and facilitating joint problem-solving for the problem list, overview, and plan. This approach promotes accurate, informed care. The treatment plan was discussed and reviewed in detail, including medication safety, potential side effects, and all patient questions. We confirmed understanding and comfort with the plan. Follow-up instructions were established, including contacting the office for any concerns, returning if symptoms worsen, persist, or new symptoms develop, and precautions for potential emergency department visits.

## 2023-05-02 NOTE — Assessment & Plan Note (Signed)
Recent weight gain may be associated with mirtazapine. Pending insurance approval, weight loss medication is an option. Vitamin D supplementation is recommended, especially if pursuing weight loss.

## 2023-05-02 NOTE — Assessment & Plan Note (Signed)
She is currently medicated for depression but expresses doubts about its efficacy. We encourage a conversation with her psychiatrist about medication adjustment. EMDR therapy for PTSD, which could also ameliorate depressive symptoms, is worth considering

## 2023-05-02 NOTE — Patient Instructions (Addendum)
It's important to understand that insomnia, fatigue, depression, and potential PTSD are complex conditions that often intertwine. While we work towards a comprehensive treatment plan, here are some immediate steps you can take to improve your well-being:  See if psychiatry will change your mirtazapine to something else that doesn't cause as much weight gain.  See if they will change your sleep regimen and if they want to defer to to me let me know.   Prioritize Sleep Hygiene:  Consistent Sleep Schedule: Try to go to bed and wake up at the same time every day, even on weekends. Create a Relaxing Bedtime Routine: Engage in calming activities before bed, such as reading a book, taking a warm bath, or practicing relaxation techniques like deep breathing or meditation.    Optimize Your Sleep Environment: Ensure your bedroom is dark, quiet, and cool. Invest in a comfortable mattress, pillows, and bedding.    Limit Screen Time: Reduce exposure to electronic devices, especially phones and tablets, before bed. The blue light emitted can interfere with sleep. Manage Daytime Naps: While short naps might seem tempting, they can disrupt nighttime sleep. Limit naps to 20-30 minutes during the day. Address Fatigue and Depression:  Physical Activity: Engage in regular physical activity, such as walking, swimming, or yoga. Exercise can boost energy levels and improve mood. Healthy Diet: Consume a balanced diet rich in fruits, vegetables, and whole grains. Avoid heavy meals, caffeine, and alcohol, especially close to bedtime. Mindfulness Techniques: Practice mindfulness techniques, such as meditation or yoga, to reduce stress and anxiety. Social Connection: Maintain strong social relationships and seek support from loved ones. Preparing for Your Behavioral Health Appointment:  Medication List: Create a comprehensive list of all medications you are currently taking, including prescription and over-the-counter  drugs. Symptom Journal: Keep a journal to track your sleep patterns, mood swings, and any other relevant symptoms. Include details about the severity and frequency of your symptoms. Questions for Your Doctor: Write down any questions or concerns you have for your behavioral health provider. Follow-up and Additional Support:  We will follow up with you after your appointment to discuss your treatment plan and any additional recommendations. Please don't hesitate to reach out to [place] if you have any questions or concerns.  Remember, it's important to be patient with yourself. Improving your sleep and overall well-being takes time and consistent effort. By following these guidelines and working closely with your healthcare team, you can make significant progress.  Your recent blood test showed low levels of Vitamin D:  VITD  Date/Time Value Ref Range Status  09/25/2022 09:53 AM 26.68 (L) 30.00 - 100.00 ng/mL Final   This means your body doesn't have enough and you will lose bone and be fatigued as a result.  Here's how to raise your Vitamin D safely:  [x]   Supplements: Choose over-the-counter Vitamin D3 (cholecalciferol). It's the kind your body makes from sunlight. [x]   Start with 2,000 IU daily. Don't take more than 4,000 IU without a doctor advising it, as too much can be harmful. [x]   Take with a meal that has fat. Vitamin D absorbs better with fat. Its also more effective if there is calcium with the meal. [x]   Get re-tested after 3 months. This helps see if the dose needs adjusting. [x]   Sun exposure: Aim for 10-15 minutes of midday sun a few times a week. [x]   Eat Vitamin D rich foods: Fatty fish, egg yolks, fortified milk, and cereals can help. [x]   Important: Supplements aren't  strictly controlled, so talk to your doctor before starting any  For questions about your recent visit/treatment, reply here for simple inquiries. Complex topics? Schedule an appointment (reply or call  [phone]).  We're here to help!  Sincerely, Lula Olszewski, MD  05/02/2023 9:31 AM   VISIT SUMMARY:  During today's visit, we discussed your ongoing issues with insomnia, fatigue, and depression. We also reviewed your recent weight gain and decreased physical activity following your toe surgery. Additionally, we addressed your concerns about long-term clonazepam use and its potential impact on cognitive function, as well as your history of trauma and its effect on your mental health.  YOUR PLAN:  -INSOMNIA AND FATIGUE: Insomnia is difficulty falling or staying asleep, and fatigue is a feeling of extreme tiredness. We will consider a sleep study to better understand your sleep patterns and recommend using the [employer] app to check for sleep apnea. We also suggest discussing with your psychiatrist about adjusting your medications to improve sleep quality and reduce fatigue.  -DEPRESSION: Depression is a mood disorder characterized by persistent feelings of sadness and loss of interest. You are currently on medication for depression but feel it is not effective. We recommend talking to your psychiatrist about adjusting your medication. Additionally, EMDR therapy for PTSD may help improve your depressive symptoms.  -OVERWEIGHT: Being overweight means having more body weight than is considered healthy. Your recent weight gain may be related to your medication, mirtazapine. We will explore weight loss medication pending insurance approval and recommend taking vitamin D supplements, especially if you pursue weight loss.  -VITAMIN D DEFICIENCY: Vitamin D deficiency means having lower than normal levels of vitamin D, which is important for bone health. We noted low vitamin D levels in April and recommend starting supplementation with 2000 IU daily, taken with a fat-containing meal. We will recheck your levels in three months.  -ABNORMAL LAB RESULTS: Your recent lab results showed elevated GGT,  slightly high calcium, and CO2 levels. We plan to recheck these levels during your next lab round and may also recheck your cholesterol.  INSTRUCTIONS:  Please follow up in a few weeks to reassess your insomnia, fatigue, and depression, and to discuss any potential adjustments to your psychiatric medications. If you remain dissatisfied with your psychiatric care, we will consider taking over the management of these issues.

## 2023-05-06 ENCOUNTER — Encounter: Payer: Self-pay | Admitting: Internal Medicine

## 2023-05-06 NOTE — Progress Notes (Signed)
Reviewed labs from 05/02/23. Findings: New mild anemia (Hgb 10.9, down from 13.0 in 09/2022); mildly elevated CO2 (33); normal liver function tests (AST 19, ALT 16); well-controlled lipids (LDL 86, HDL 74). GFR excellent at 97. Trending improvement in liver enzymes from prior elevation. Anemia workup pending. Will recheck labs in 3 months. Detailed explanation sent via patient message.

## 2023-05-08 ENCOUNTER — Telehealth: Payer: Self-pay | Admitting: Internal Medicine

## 2023-05-08 NOTE — Telephone Encounter (Signed)
Patient called for an update on PA for wegovy. Is there an update?

## 2023-05-09 ENCOUNTER — Ambulatory Visit (INDEPENDENT_AMBULATORY_CARE_PROVIDER_SITE_OTHER): Payer: Medicaid Other | Admitting: Podiatry

## 2023-05-09 ENCOUNTER — Other Ambulatory Visit (HOSPITAL_COMMUNITY): Payer: Self-pay

## 2023-05-09 ENCOUNTER — Encounter: Payer: Self-pay | Admitting: Podiatry

## 2023-05-09 ENCOUNTER — Telehealth: Payer: Self-pay

## 2023-05-09 ENCOUNTER — Ambulatory Visit (INDEPENDENT_AMBULATORY_CARE_PROVIDER_SITE_OTHER): Payer: Medicaid Other

## 2023-05-09 DIAGNOSIS — Z9889 Other specified postprocedural states: Secondary | ICD-10-CM

## 2023-05-09 DIAGNOSIS — M2021 Hallux rigidus, right foot: Secondary | ICD-10-CM | POA: Diagnosis not present

## 2023-05-09 DIAGNOSIS — M21612 Bunion of left foot: Secondary | ICD-10-CM

## 2023-05-09 NOTE — Telephone Encounter (Signed)
Pharmacy Patient Advocate Encounter   Received notification from Pt Calls Messages that prior authorization for Wegovy 0.25mg /0.41ml is required/requested.   Insurance verification completed.   The patient is insured through [facility] [place] .   Per test claim: PA required; PA submitted to above mentioned insurance via CoverMyMeds Key/confirmation #/EOC [facility] Status is pending

## 2023-05-09 NOTE — Progress Notes (Signed)
Subjective:   Patient ID: Amanda White, female   DOB: 57 y.o.   MRN: [id number]   HPI Patient presents stating slowly getting better with the right but I do get swelling if I am on too long and I have a bunion left that I like to discuss and probably can need surgery on at 1 point   ROS      Objective:  Physical Exam  Neurovascular status intact negative Denna Haggard' sign noted wound edges are healing well mild edema consistent for this.  Postop excellent range of motion with no crepitus of the joint with structural deformity left moderate diminishment of motion     Assessment:  Doing well post osteotomy right first metatarsal with structural hallux limitus deformity left     Plan:  H&P reviewed both conditions and at this point for the right I reviewed her final x-ray and discharging for the left I discussed bunion deformity and correction it would most likely require the same procedures the right and she will probably wait till next year for correction with all questions answered today  X-rays indicate the osteotomy right is healing well there is still some healing to go but overall it looks good

## 2023-05-10 ENCOUNTER — Ambulatory Visit: Payer: Medicaid Other

## 2023-05-10 ENCOUNTER — Ambulatory Visit
Admission: RE | Admit: 2023-05-10 | Discharge: 2023-05-10 | Disposition: A | Discharge status: Home / Self Care | Payer: Medicaid Other | Source: Ambulatory Visit | Attending: Family Medicine | Admitting: Family Medicine

## 2023-05-10 DIAGNOSIS — Z1231 Encounter for screening mammogram for malignant neoplasm of breast: Secondary | ICD-10-CM

## 2023-05-10 DIAGNOSIS — E663 Overweight: Secondary | ICD-10-CM

## 2023-05-10 NOTE — Telephone Encounter (Signed)
Pharmacy Patient Advocate Encounter  Received notification from [facility] [employer] that Prior Authorization for [facility] 0.25mg /0.61ml  has been DENIED.  Full denial letter will be uploaded to the media tab. See denial reason below.   PA #/Case ID/Reference #: [id number]

## 2023-05-10 NOTE — Telephone Encounter (Signed)
Sent my chart message informing patient of this denial.

## 2023-05-11 NOTE — Telephone Encounter (Signed)
Patient's review of lab results/notes confirmed.

## 2023-05-13 MED ORDER — ZEPBOUND 7.5 MG/0.5ML ~~LOC~~ SOAJ: 7.5000 mg | SUBCUTANEOUS | 0 refills | Status: DC

## 2023-05-13 MED ORDER — ZEPBOUND 15 MG/0.5ML ~~LOC~~ SOAJ: 15.0000 mg | SUBCUTANEOUS | 0 refills | Status: DC

## 2023-05-13 MED ORDER — ZEPBOUND 10 MG/0.5ML ~~LOC~~ SOAJ: 10.0000 mg | SUBCUTANEOUS | 0 refills | Status: DC

## 2023-05-13 MED ORDER — ZEPBOUND 5 MG/0.5ML ~~LOC~~ SOAJ: 5.0000 mg | SUBCUTANEOUS | 0 refills | Status: DC

## 2023-05-13 MED ORDER — ZEPBOUND 2.5 MG/0.5ML ~~LOC~~ SOAJ: 2.5000 mg | SUBCUTANEOUS | 0 refills | Status: AC

## 2023-05-13 MED ORDER — ZEPBOUND 12.5 MG/0.5ML ~~LOC~~ SOAJ: 12.5000 mg | SUBCUTANEOUS | 0 refills | Status: DC

## 2023-05-16 ENCOUNTER — Telehealth: Payer: Self-pay | Admitting: Internal Medicine

## 2023-05-16 NOTE — Telephone Encounter (Signed)
Patient called in stating that pharmacy informed her that a PA is required for the zepbound. Can this be started?

## 2023-05-16 NOTE — Telephone Encounter (Signed)
Requested prior authorization team to start a PA for Zepbound 2.5 mg for patient.

## 2023-05-16 NOTE — Telephone Encounter (Signed)
Sent mychart message informing patient  

## 2023-05-17 ENCOUNTER — Other Ambulatory Visit (HOSPITAL_COMMUNITY): Payer: Self-pay

## 2023-05-17 ENCOUNTER — Telehealth: Payer: Self-pay

## 2023-05-17 NOTE — Telephone Encounter (Signed)
Pharmacy Patient Advocate Encounter   Received notification from [facility] that prior authorization for Zepbound 2.5MG /0.5ML pen-injectors is required/requested.   Insurance verification completed.   The patient is insured through [facility] [place] .   Per test claim: PA required; PA submitted to above mentioned insurance via CoverMyMeds Key/confirmation #/EOC [id number] Status is pending

## 2023-05-17 NOTE — Telephone Encounter (Signed)
Pharmacy Patient Advocate Encounter  Received notification from [facility] that Prior Authorization for Zepbound 2.5mg /0.35ml has been DENIED.  See denial reason below. No denial letter attached in CMM. Will attach denial letter to Media tab once received.   PA #/Case ID/Reference #: [id number]

## 2023-05-17 NOTE — Telephone Encounter (Signed)
-----   Message from CMA Tiffany L sent at 05/16/2023  5:37 PM EST ----- Regarding: PA for Zepbound Please start a PA for Zepbound 2.5 mg for patient. Thank you.

## 2023-05-18 ENCOUNTER — Ambulatory Visit (HOSPITAL_COMMUNITY): Payer: Medicaid Other | Admitting: Licensed Clinical Social Worker

## 2023-05-18 NOTE — Telephone Encounter (Signed)
Already informed patient of this denial and the reason via my chart message.

## 2023-05-18 NOTE — Telephone Encounter (Signed)
Informed patient of this denial already.

## 2023-05-22 ENCOUNTER — Ambulatory Visit (HOSPITAL_COMMUNITY): Payer: Medicaid Other | Admitting: Physician Assistant

## 2023-05-22 VITALS — BP 132/69 | HR 94 | Temp 98.2°F | Ht 63.0 in | Wt 144.2 lb

## 2023-05-22 DIAGNOSIS — F411 Generalized anxiety disorder: Secondary | ICD-10-CM | POA: Diagnosis not present

## 2023-05-22 DIAGNOSIS — F331 Major depressive disorder, recurrent, moderate: Secondary | ICD-10-CM

## 2023-05-22 DIAGNOSIS — Z79899 Other long term (current) drug therapy: Secondary | ICD-10-CM | POA: Diagnosis not present

## 2023-05-23 ENCOUNTER — Encounter (HOSPITAL_COMMUNITY): Payer: Self-pay | Admitting: Physician Assistant

## 2023-05-23 NOTE — Progress Notes (Unsigned)
BH MD/PA/NP OP Progress Note  05/23/2023 10:19 AM Amanda White  MRN:  [id number]  Chief Complaint:  Chief Complaint  Patient presents with   Follow-up   HPI:   Amanda White is a 57 year old female with a past psychiatric history significant for generalized anxiety disorder and major depressive disorder who presents to [facility] for follow-up and medication management.  Patient is currently being managed on the following psychiatric medication:  Clonazepam 2 mg at bedtime Mirtazapine 7.5 mg at bedtime Carbamazepine 100 mg 2 times daily  Patient presents to the encounter stating that she has been putting on more weight.  She reports that her primary care provider does not want her to gain anymore weight.  In an effort to limit her weight gain, patient reports that she has discontinued her use of mirtazapine.  She reports that she is still taking her clonazepam regularly.  Patient endorses worsening depression attributed to no one getting along in her family.  She also reports that she recently woke up in the middle of the night with a real bad panic attack.  Patient reports that she is willing to get off her clonazepam but states that she wants to get her life in order first.  She attributes her depression to her husband trying to control her.  For example, patient reports that her husband requires her to put on her locator so that he knows where she is at all times.  She also reports that her depression is related to her son having issues at his job.  She reports that whenever he is stressed, she gets stressed.  Patient reports that if she misses one day of her clonazepam uses, then her day is alert and she is not able to sleep at all.  A PHQ-9 screen was performed with the patient scoring a 15.  A GAD-7 screen was also performed the patient scoring a 21.  Patient is alert and oriented x 4, calm, cooperative, and fully engaged in  conversation during the encounter.  Patient endorses okay mood.  She describes herself as neutral and states that she is just existing.  Patient denies suicidal or homicidal ideations.  She further denies auditory or visual hallucinations and does not appear to be responding to internal/external stimuli.  Patient endorses good sleep and receives on average 5 to 7 hours of sleep per night.  Patient endorses fair appetite and eats on average 3 meals per day.  Patient denies alcohol consumption, tobacco use, or illicit drug use.  Visit Diagnosis:    ICD-10-CM   1. Moderate episode of recurrent major depressive disorder (HCC)  F33.1     2. Generalized anxiety disorder  F41.1     3. Long-term current use of benzodiazepine  Z79.899       Past Psychiatric History:  Patient reports that she has a history of mental health and has been treated for the last 20 years.  She reports that a lot of her mental health extends from her husband.  Patient is diagnosed with generalized anxiety disorder that is being managed by her primary care provider   Patient denies a past history of hospitalization due to mental health   Patient denies a past history of suicide attempts Patient denies a past history of homicide attempts  Past Medical History:  Past Medical History:  Diagnosis Date   Abnormal cardiac CT angiography 10/12/2022   Small intermediate density pericardial effusion versus thickening. Consider further evaluation with  echocardiography.   Anxiety    Bipolar 1 disorder (HCC)    Carpal tunnel syndrome 09/25/2022   R sided release 2022, all better now.   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   Depression    Depression    Dizziness 10/09/2022   Saw emergency room  10/03/22   GERD (gastroesophageal reflux disease)    History of weight loss 12/25/2022   Hypertension    LFT elevation 12/25/2022   Lab Results      Component    Value    Date/Time           ALT    28    12/25/2022 10:31 AM           ALT    99 (H)    09/25/2022 09:53 AM           ALT    22    06/22/2020 01:26 PM           ALT    28    05/19/2018 09:51 PM           ALT    13    01/03/2012 09:55 AM             Lab Results      Component    Value    Date/Time           AST    25    12/25/2022 10:31 AM           AST    40 (H)    0   Migraine    Pressure ulcer 11/28/2022   Subcutaneous nodule 09/25/2022   Unintentional weight loss 09/25/2022   November 28, 2022 interim history:   Denies any heartburn, prior abdomen/flank pain gone, just some right upper chest pain now, gastrointestinal did endoscopy and found ulcer but we don't have report.  Off advil. Weight stable. Continuing remeron         Wt Readings from Last 5 Encounters:  11/28/22  125 lb 6.4 oz (56.9 kg)  11/24/22  125 lb 6.4 oz (56.9 kg)  11/16/22  125 lb 11.2 oz (57 kg)  10/25/22     Past Surgical History:  Procedure Laterality Date   CARPAL TUNNEL RELEASE Right 04/18/2021   Procedure: RIGHT ENDOSCOPIC CARPAL TUNNEL RELEASE;  Surgeon: Mack Hook, MD;  Location: [facility];  Service: Orthopedics;  Laterality: Right;  LENTH OF SURGERY: 30 MINUTES   DILATION AND CURETTAGE OF UTERUS     FOOT SURGERY  02/27/2023   --- BI PLANAR OSTEOTOMY 1ST MPJ RT, FIXATION    Family Psychiatric History:  Patient reports that her mother's side of the family struggles with mental health.  She reports that her mother's side of the family from [place] and states that many family members saw many of the atrocities during World War II.   Family history of suicide attempts: Patient denies Family history of homicide attempts: Patient denies Family history of substance abuse: Patient reports that her nephew abused heroin.  She reports that her  brother uses marijuana frequently.  She reports that her father was an alcoholic  Family History:  Family History  Problem Relation Age of Onset   Hypertension Mother  Stroke Father    BRCA 1/2 Neg Hx    Breast cancer Neg Hx     Social History:  Social History   Socioeconomic History   Marital status: Married    Spouse name: Not on file   Number of children: Not on file   Years of education: Not on file   Highest education level: Bachelor's degree (e.g., BA, AB, BS)  Occupational History   Not on file  Tobacco Use   Smoking status: Never   Smokeless tobacco: Never  Substance and Sexual Activity   Alcohol use: No    Comment: occas.   Drug use: No   Sexual activity: Not on file  Other Topics Concern   Not on file  Social History Narrative   Not on file   Social Determinants of Health   Financial Resource Strain: Patient Declined (04/30/2023)   Overall Financial Resource Strain (CARDIA)    Difficulty of Paying Living Expenses: Patient declined  Food Insecurity: Patient Declined (04/30/2023)   Hunger Vital Sign    Worried About Running Out of Food in the Last Year: Patient declined    Ran Out of Food in the Last Year: Patient declined  Transportation Needs: No Transportation Needs (04/30/2023)   PRAPARE - Administrator, Civil Service (Medical): No    Lack of Transportation (Non-Medical): No  Physical Activity: Inactive (04/30/2023)   Exercise Vital Sign    Days of Exercise per Week: 0 days    Minutes of Exercise per Session: 40 min  Stress: Stress Concern Present (04/30/2023)   [employer] of Occupational Health - Occupational Stress Questionnaire    Feeling of Stress : Rather much  Social Connections: Unknown (04/30/2023)   Social Connection and Isolation Panel [NHANES]    Frequency of Communication with Friends and Family: Once a week    Frequency of Social Gatherings with Friends and Family: Patient declined    Attends Religious Services:  Patient declined    Database administrator or Organizations: No    Attends Engineer, structural: Not on file    Marital Status: Married    Allergies: No Known Allergies  Metabolic Disorder Labs: No results found for: "HGBA1C", "MPG" No results found for: "PROLACTIN" Lab Results  Component Value Date   CHOL 179 05/02/2023   TRIG 93.0 05/02/2023   HDL 74.30 05/02/2023   CHOLHDL 2 05/02/2023   VLDL 18.6 05/02/2023   LDLCALC 86 05/02/2023   LDLCALC 124 (H) 09/25/2022   Lab Results  Component Value Date   TSH 1.31 11/21/2022   TSH 0.63 09/25/2022    Therapeutic Level Labs: No results found for: "LITHIUM" No results found for: "VALPROATE" No results found for: "CBMZ"  Current Medications: Current Outpatient Medications  Medication Sig Dispense Refill   acetaminophen (TYLENOL) 325 MG tablet Take 650 mg by mouth every 6 (six) hours as needed.     carbamazepine (TEGRETOL) 100 MG chewable tablet Chew 1 tablet (100 mg total) by mouth 2 (two) times daily. 60 tablet 1   celecoxib (CELEBREX) 200 MG capsule Take 1 capsule (200 mg total) by mouth 2 (two) times daily. 180 capsule 3   ciclopirox (LOPROX) 0.77 % cream Apply topically 2 (two) times daily. 90 g 2   clonazePAM (KLONOPIN) 2 MG tablet Take 1 tablet (2 mg total) by mouth at bedtime. 30 tablet 1   cyclobenzaprine (FLEXERIL) 10 MG tablet Take 1 tablet (10 mg total) by mouth daily as needed for muscle spasms.  30 tablet 2   diclofenac Sodium (VOLTAREN) 1 % GEL Apply 4 g topically 4 (four) times daily as needed. 100 g 3   famotidine (PEPCID) 20 MG tablet Take 20 mg by mouth 2 (two) times daily.     famotidine-calcium carbonate-magnesium hydroxide (PEPCID COMPLETE) 10-800-165 MG chewable tablet Chew 1 tablet by mouth daily as needed. 100 tablet 11   gabapentin (NEURONTIN) 300 MG capsule Take 1 capsule (300 mg total) by mouth 3 (three) times daily. 270 capsule 3   lisinopril-hydrochlorothiazide (ZESTORETIC) 10-12.5 MG tablet  TAKE 1 TABLET BY MOUTH EVERY DAY 90 tablet 1   mirtazapine (REMERON) 7.5 MG tablet TAKE 1 TABLET BY MOUTH EVERYDAY AT BEDTIME 90 tablet 1   ondansetron (ZOFRAN ODT) 4 MG disintegrating tablet Take 1 tablet (4 mg total) by mouth every 8 (eight) hours as needed for nausea or vomiting. 20 tablet 0   ondansetron (ZOFRAN) 4 MG tablet Take 1 tablet (4 mg total) by mouth every 8 (eight) hours as needed for nausea or vomiting. 20 tablet 0   pantoprazole (PROTONIX) 40 MG tablet TAKE 1 TABLET (40 MG TOTAL) BY MOUTH TWICE A DAY BEFORE MEALS 180 tablet 1   rosuvastatin (CRESTOR) 10 MG tablet TAKE 1 TABLET (10 MG TOTAL) BY MOUTH DAILY FOR 30 DAYS, THEN 2 TABLETS (20 MG TOTAL) DAILY. 180 tablet 1   Semaglutide-Weight Management 0.25 MG/0.5ML SOAJ Inject 0.25 mg into the skin once a week for 28 days. 2 mL 0   [START ON 05/31/2023] Semaglutide-Weight Management 0.5 MG/0.5ML SOAJ Inject 0.5 mg into the skin once a week for 28 days. 2 mL 0   [START ON 06/29/2023] Semaglutide-Weight Management 1 MG/0.5ML SOAJ Inject 1 mg into the skin once a week for 28 days. 2 mL 0   [START ON 07/28/2023] Semaglutide-Weight Management 1.7 MG/0.75ML SOAJ Inject 1.7 mg into the skin once a week for 28 days. 3 mL 0   [START ON 08/26/2023] Semaglutide-Weight Management 2.4 MG/0.75ML SOAJ Inject 2.4 mg into the skin once a week for 28 days. 3 mL 0   sucralfate (CARAFATE) 1 g tablet Take 1 g by mouth 3 (three) times daily.     [START ON 08/05/2023] tirzepatide (ZEPBOUND) 10 MG/0.5ML Pen Inject 10 mg into the skin once a week for 4 doses. Ok to go up to this dose after weeks stable on the dose 2.5 mg lower than this 2 mL 0   [START ON 09/02/2023] tirzepatide (ZEPBOUND) 12.5 MG/0.5ML Pen Inject 12.5 mg into the skin once a week for 4 doses. Ok to go up to this dose after weeks stable on the dose 2.5 mg lower than this 2 mL 0   [START ON 09/20/2023] tirzepatide (ZEPBOUND) 15 MG/0.5ML Pen Inject 15 mg into the skin once a week for 4 doses. Ok to go up to  this dose after weeks stable on the dose 2.5 mg lower than this 2 mL 0   tirzepatide (ZEPBOUND) 2.5 MG/0.5ML Pen Inject 2.5 mg into the skin once a week for 4 doses. Ok to go up to this dose after weeks stable on the dose 2.5 mg lower than this 2 mL 0   [START ON 06/10/2023] tirzepatide (ZEPBOUND) 5 MG/0.5ML Pen Inject 5 mg into the skin once a week for 4 doses. Ok to go up to this dose after weeks stable on the dose 2.5 mg lower than this 2 mL 0   [START ON 07/08/2023] tirzepatide (ZEPBOUND) 7.5 MG/0.5ML Pen Inject 7.5 mg into the  skin once a week for 4 doses. Ok to go up to this dose after weeks stable on the dose 2.5 mg lower than this 2 mL 0   No current facility-administered medications for this visit.     Musculoskeletal: Strength & Muscle Tone: within normal limits Gait & Station: normal Patient leans: N/A  Psychiatric Specialty Exam: Review of Systems  Psychiatric/Behavioral:  Positive for dysphoric mood. Negative for decreased concentration, hallucinations, self-injury, sleep disturbance and suicidal ideas. The patient is nervous/anxious. The patient is not hyperactive.     Blood pressure 132/69, pulse 94, temperature 98.2 F (36.8 C), temperature source Oral, height 5\' 3"  (1.6 m), weight 144 lb 3.2 oz (65.4 kg), last menstrual period 12/20/2011, SpO2 99%.Body mass index is 25.54 kg/m.  General Appearance: Casual  Eye Contact:  Good  Speech:  Clear and Coherent and Normal Rate  Volume:  Normal  Mood:  Anxious and Dysphoric  Affect:  Appropriate  Thought Process:  Coherent, Goal Directed, and Descriptions of Associations: Intact  Orientation:  Full (Time, Place, and Person)  Thought Content: WDL   Suicidal Thoughts:  No  Homicidal Thoughts:  No  Memory:  Immediate;   Good Recent;   Good Remote;   Good  Judgement:  Good  Insight:  Good  Psychomotor Activity:  Normal  Concentration:  Concentration: Good and Attention Span: Good  Recall:  Good  Fund of Knowledge: Good   Language: Good  Akathisia:  No  Handed:  Right  AIMS (if indicated): not done  Assets:  Communication Skills Desire for Improvement Housing Social Support Transportation Vocational/Educational  ADL's:  Intact  Cognition: WNL  Sleep:  Good   Screenings: GAD-7    Flowsheet Row Clinical Support from 05/22/2023 in [facility] Office Visit from 05/02/2023 in [facility] Clinical Support from 03/20/2023 in [facility] Office Visit from 03/05/2023 in [facility] [employer] Visit from 01/31/2023 in [facility]  Total GAD-7 Score 21 20 18 8 17       PHQ2-9    Flowsheet Row Clinical Support from 05/22/2023 in [facility] Office Visit from 05/02/2023 in [facility] Clinical Support from 03/20/2023 in [facility] Office Visit from 03/05/2023 in [facility] [place] Office Visit from 01/31/2023 in [facility]  PHQ-2 Total Score 4 4 2 2 5   PHQ-9 Total Score 15 15 6 7 14       Flowsheet Row Clinical Support from 05/22/2023 in [facility] Clinical Support from 03/20/2023 in [facility] Clinical Support from 01/16/2023 in [facility]  C-SSRS RISK CATEGORY No Risk No Risk No Risk        Assessment and Plan:   Amanda White is a 57 year old female with a past psychiatric history significant for generalized anxiety disorder and major depressive disorder who presents to [facility] for follow-up and medication management.  Patient presents to the encounter stating that she has discontinued her use of mirtazapine since her provider does not want her gaining  any more weight.  She reports that she continues to take her clonazepam regularly and is interested in weaning herself off the medication, but she reports that she is not ready due  to her life not being in order.  Patient continues to endorse depression attributed to family dynamics/issues.  Patient also reports that she recently woke up and had a panic attack.  Patient reports that she has been on a variety of medications in the past.  Patient believes that she has been on the following medications: Zoloft, Cymbalta, Depakote, Topamax, Lexapro, Celexa, Prozac, and Seroquel.  Patient to continue taking her medication as prescribed.  Will use the next encounter to discuss other options for managing patient's depression and anxiety.  Patient was agreeable to recommendation.  Collaboration of Care: Collaboration of Care: Medication Management AEB provider managing patient's psychiatric medications, Primary Care Provider AEB patient being seen by her primary care provider, Psychiatrist AEB patient being seen by mental health provider at this facility, and Other provider involved in patient's care AEB patient being seen by neurology, cardiology, and OB/GYN  Patient/Guardian was advised Release of Information must be obtained prior to any record release in order to collaborate their care with an outside provider. Patient/Guardian was advised if they have not already done so to contact the registration department to sign all necessary forms in order for [place] to release information regarding their care.   Consent: Patient/Guardian gives verbal consent for treatment and assignment of benefits for services provided during this visit. Patient/Guardian expressed understanding and agreed to proceed.   1. Generalized anxiety disorder Patient to continue taking clonazepam 2 mg at bedtime for the management of her generalized anxiety disorder  2. Moderate episode of recurrent major depressive disorder (HCC)  3.  Long-term current use of benzodiazepine  Patient to follow-up in 2 months Provider spent a total of 43 minutes with the patient/reviewing patient's chart  Meta Hatchet, PA 05/23/2023, 10:19 AM

## 2023-05-25 ENCOUNTER — Other Ambulatory Visit: Payer: Self-pay | Admitting: Internal Medicine

## 2023-05-25 DIAGNOSIS — E663 Overweight: Secondary | ICD-10-CM

## 2023-05-27 DIAGNOSIS — Z419 Encounter for procedure for purposes other than remedying health state, unspecified: Secondary | ICD-10-CM | POA: Diagnosis not present

## 2023-05-29 NOTE — Telephone Encounter (Signed)
Patient has been denied for both Zepbound and [id number].

## 2023-05-30 ENCOUNTER — Ambulatory Visit (HOSPITAL_COMMUNITY): Payer: Medicaid Other | Admitting: Licensed Clinical Social Worker

## 2023-05-31 ENCOUNTER — Ambulatory Visit (HOSPITAL_COMMUNITY): Payer: Medicaid Other | Admitting: Licensed Clinical Social Worker

## 2023-05-31 DIAGNOSIS — F331 Major depressive disorder, recurrent, moderate: Secondary | ICD-10-CM | POA: Diagnosis not present

## 2023-05-31 DIAGNOSIS — F411 Generalized anxiety disorder: Secondary | ICD-10-CM
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
## 2023-06-05 ENCOUNTER — Other Ambulatory Visit: Payer: Self-pay | Admitting: Internal Medicine

## 2023-06-05 DIAGNOSIS — E663 Overweight: Secondary | ICD-10-CM

## 2023-06-06 ENCOUNTER — Encounter (HOSPITAL_COMMUNITY): Payer: Self-pay | Admitting: Physician Assistant

## 2023-06-06 ENCOUNTER — Ambulatory Visit (HOSPITAL_COMMUNITY): Payer: Medicaid Other | Admitting: Physician Assistant

## 2023-06-06 VITALS — BP 160/79 | HR 89 | Ht 63.0 in | Wt 146.8 lb

## 2023-06-06 DIAGNOSIS — Z79899 Other long term (current) drug therapy: Secondary | ICD-10-CM

## 2023-06-06 DIAGNOSIS — F411 Generalized anxiety disorder: Secondary | ICD-10-CM

## 2023-06-06 DIAGNOSIS — Z8711 Personal history of peptic ulcer disease: Secondary | ICD-10-CM | POA: Diagnosis not present

## 2023-06-06 DIAGNOSIS — F331 Major depressive disorder, recurrent, moderate: Secondary | ICD-10-CM | POA: Diagnosis not present

## 2023-06-06 DIAGNOSIS — K219 Gastro-esophageal reflux disease without esophagitis: Secondary | ICD-10-CM | POA: Diagnosis not present

## 2023-06-06 MED ORDER — VENLAFAXINE HCL ER 37.5 MG PO CP24: ORAL_CAPSULE | ORAL | 1 refills | Status: DC

## 2023-06-06 MED ORDER — CLONAZEPAM 2 MG PO TABS: 2.0000 mg | ORAL_TABLET | Freq: Every day | ORAL | 1 refills | Status: DC

## 2023-06-06 NOTE — Progress Notes (Signed)
BH MD/PA/NP OP Progress Note  06/06/2023 6:36 PM Amanda White  MRN:  [id number]  Chief Complaint:  Chief Complaint  Patient presents with   Follow-up   Medication Management   HPI:   Amanda White is a 57 year old female with a past psychiatric history significant for generalized anxiety disorder and major depressive disorder who presents to [facility] for follow-up and medication management.  Patient is currently being managed on the following psychiatric medication: Clonazepam 2 mg at bedtime.  Patient presents to the encounter stating that her depression continues to be present.  She reports that her depression is always present during this specific time of the year.  She denies experiencing a deep depression but states that the depression that she does experience is attributed to missing her mother.  She also attributes her depression to she and her husband not seeing eye to eye over various things in her life.  For example, patient reports that she recently had a conflict with her husband regarding wanting to work elsewhere.  Patient reports that she is often playing the mediator within the family, which takes a toll on her.  Patient feels she needs to focus on herself more.  In addition to depression, patient endorses fluctuating anxiety. She reports that her anxiety is at it's peak when her husband comes home.  Patient denies being fearful of her husband but states that she gets anxious because she does not know what mood he will be in when coming back home.  She reports that her husband also knows how to push her buttons.  A PHQ-9 screen was performed with the patient scoring an 8.  A GAD-7 screen was also performed the patient scoring a 12.  Patient is alert and oriented x 4, calm, cooperative, and fully engaged in conversation during the encounter.  Patient endorses good mood.  Patient denies suicidal or homicidal ideations.  She  further denies auditory or visual hallucinations and does not appear to be responding to internal/external stimuli.  Patient endorses fair sleep and receives on average 6 hours of sleep per night.  Patient reports that she occasionally does not feel like she is in deep sleep.  Patient endorses fair appetite and eats on average 2 meals per day.  Patient denies alcohol consumption, tobacco use, or illicit drug use.  Visit Diagnosis:    ICD-10-CM   1. Moderate episode of recurrent major depressive disorder (HCC)  F33.1 venlafaxine XR (EFFEXOR-XR) 37.5 MG 24 hr capsule    2. Generalized anxiety disorder  F41.1 venlafaxine XR (EFFEXOR-XR) 37.5 MG 24 hr capsule    clonazePAM (KLONOPIN) 2 MG tablet    3. Long-term current use of benzodiazepine  Z79.899       Past Psychiatric History:  Patient reports that she has a history of mental health and has been treated for the last 20 years.  She reports that a lot of her mental health extends from her husband.  Patient is diagnosed with generalized anxiety disorder that is being managed by her primary care provider   Patient denies a past history of hospitalization due to mental health   Patient denies a past history of suicide attempts Patient denies a past history of homicide attempts  Past Medical History:  Past Medical History:  Diagnosis Date   Abnormal cardiac CT angiography 10/12/2022   Small intermediate density pericardial effusion versus thickening. Consider further evaluation with echocardiography.   Anxiety    Bipolar 1 disorder (HCC)  Carpal tunnel syndrome 09/25/2022   R sided release 2022, all better now.   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   Depression    Depression    Dizziness 10/09/2022    Saw emergency room 10/03/22   GERD (gastroesophageal reflux disease)    History of weight loss 12/25/2022   Hypertension    LFT elevation 12/25/2022   Lab Results      Component    Value    Date/Time           ALT    28    12/25/2022 10:31 AM           ALT    99 (H)    09/25/2022 09:53 AM           ALT    22    06/22/2020 01:26 PM           ALT    28    05/19/2018 09:51 PM           ALT    13    01/03/2012 09:55 AM             Lab Results      Component    Value    Date/Time           AST    25    12/25/2022 10:31 AM           AST    40 (H)    0   Migraine    Pressure ulcer 11/28/2022   Subcutaneous nodule 09/25/2022   Unintentional weight loss 09/25/2022   November 28, 2022 interim history:   Denies any heartburn, prior abdomen/flank pain gone, just some right upper chest pain now, gastrointestinal did endoscopy and found ulcer but we don't have report.  Off advil. Weight stable. Continuing remeron         Wt Readings from Last 5 Encounters:  11/28/22  125 lb 6.4 oz (56.9 kg)  11/24/22  125 lb 6.4 oz (56.9 kg)  11/16/22  125 lb 11.2 oz (57 kg)  10/25/22     Past Surgical History:  Procedure Laterality Date   CARPAL TUNNEL RELEASE Right 04/18/2021   Procedure: RIGHT ENDOSCOPIC CARPAL TUNNEL RELEASE;  Surgeon: Mack Hook, MD;  Location:  SURGERY CENTER;  Service: Orthopedics;  Laterality: Right;  LENTH OF SURGERY: 30 MINUTES   DILATION AND CURETTAGE OF UTERUS     FOOT SURGERY  02/27/2023   --- BI PLANAR OSTEOTOMY 1ST MPJ RT, FIXATION    Family Psychiatric History:  Patient reports that her mother's side of the family struggles with mental health.  She reports that her mother's side of the family from [place] and states that many family members saw many of the atrocities during World War II.   Family history of suicide attempts: Patient denies Family history of homicide attempts: Patient denies Family history of substance abuse: Patient reports that her nephew abused heroin.  She  reports that her brother uses marijuana frequently.  She reports that her father was an alcoholic  Family History:  Family History  Problem Relation Age of Onset   Hypertension Mother    Stroke Father    BRCA 1/2 Neg Hx  Breast cancer Neg Hx     Social History:  Social History   Socioeconomic History   Marital status: Married    Spouse name: Not on file   Number of children: Not on file   Years of education: Not on file   Highest education level: Bachelor's degree (e.g., BA, AB, BS)  Occupational History   Not on file  Tobacco Use   Smoking status: Never   Smokeless tobacco: Never  Substance and Sexual Activity   Alcohol use: No    Comment: occas.   Drug use: No   Sexual activity: Not on file  Other Topics Concern   Not on file  Social History Narrative   Not on file   Social Determinants of Health   Financial Resource Strain: Medium Risk (05/31/2023)   Overall Financial Resource Strain (CARDIA)    Difficulty of Paying Living Expenses: Somewhat hard  Food Insecurity: No Food Insecurity (05/31/2023)   Hunger Vital Sign    Worried About Running Out of Food in the Last Year: Never true    Ran Out of Food in the Last Year: Never true  Transportation Needs: No Transportation Needs (04/30/2023)   PRAPARE - Administrator, Civil Service (Medical): No    Lack of Transportation (Non-Medical): No  Physical Activity: Inactive (05/31/2023)   Exercise Vital Sign    Days of Exercise per Week: 0 days    Minutes of Exercise per Session: 0 min  Stress: Stress Concern Present (05/31/2023)   [employer] of Occupational Health - Occupational Stress Questionnaire    Feeling of Stress : Very much  Social Connections: Socially Isolated (05/31/2023)   Social Connection and Isolation Panel [NHANES]    Frequency of Communication with Friends and Family: Once a week    Frequency of Social Gatherings with Friends and Family: Never    Attends Religious Services: Never     Database administrator or Organizations: No    Attends Engineer, structural: Never    Marital Status: Married    Allergies: No Known Allergies  Metabolic Disorder Labs: No results found for: "HGBA1C", "MPG" No results found for: "PROLACTIN" Lab Results  Component Value Date   CHOL 179 05/02/2023   TRIG 93.0 05/02/2023   HDL 74.30 05/02/2023   CHOLHDL 2 05/02/2023   VLDL 18.6 05/02/2023   LDLCALC 86 05/02/2023   LDLCALC 124 (H) 09/25/2022   Lab Results  Component Value Date   TSH 1.31 11/21/2022   TSH 0.63 09/25/2022    Therapeutic Level Labs: No results found for: "LITHIUM" No results found for: "VALPROATE" No results found for: "CBMZ"  Current Medications: Current Outpatient Medications  Medication Sig Dispense Refill   venlafaxine XR (EFFEXOR-XR) 37.5 MG 24 hr capsule Take 1 capsule (37.5 mg total) by mouth daily with breakfast for 6 days, THEN 2 capsules (75 mg total) daily with breakfast. 60 capsule 1   acetaminophen (TYLENOL) 325 MG tablet Take 650 mg by mouth every 6 (six) hours as needed.     carbamazepine (TEGRETOL) 100 MG chewable tablet Chew 1 tablet (100 mg total) by mouth 2 (two) times daily. 60 tablet 1   celecoxib (CELEBREX) 200 MG capsule Take 1 capsule (200 mg total) by mouth 2 (two) times daily. 180 capsule 3   ciclopirox (LOPROX) 0.77 % cream Apply topically 2 (two) times daily. 90 g 2   clonazePAM (KLONOPIN) 2 MG tablet Take 1 tablet (2 mg total) by mouth at bedtime. 30 tablet  1   cyclobenzaprine (FLEXERIL) 10 MG tablet Take 1 tablet (10 mg total) by mouth daily as needed for muscle spasms. 30 tablet 2   diclofenac Sodium (VOLTAREN) 1 % GEL Apply 4 g topically 4 (four) times daily as needed. 100 g 3   famotidine (PEPCID) 20 MG tablet Take 20 mg by mouth 2 (two) times daily.     famotidine-calcium carbonate-magnesium hydroxide (PEPCID COMPLETE) 10-800-165 MG chewable tablet Chew 1 tablet by mouth daily as needed. 100 tablet 11   gabapentin  (NEURONTIN) 300 MG capsule Take 1 capsule (300 mg total) by mouth 3 (three) times daily. 270 capsule 3   lisinopril-hydrochlorothiazide (ZESTORETIC) 10-12.5 MG tablet TAKE 1 TABLET BY MOUTH EVERY DAY 90 tablet 1   ondansetron (ZOFRAN ODT) 4 MG disintegrating tablet Take 1 tablet (4 mg total) by mouth every 8 (eight) hours as needed for nausea or vomiting. 20 tablet 0   ondansetron (ZOFRAN) 4 MG tablet Take 1 tablet (4 mg total) by mouth every 8 (eight) hours as needed for nausea or vomiting. 20 tablet 0   pantoprazole (PROTONIX) 40 MG tablet TAKE 1 TABLET (40 MG TOTAL) BY MOUTH TWICE A DAY BEFORE MEALS 180 tablet 1   rosuvastatin (CRESTOR) 10 MG tablet TAKE 1 TABLET (10 MG TOTAL) BY MOUTH DAILY FOR 30 DAYS, THEN 2 TABLETS (20 MG TOTAL) DAILY. 180 tablet 1   Semaglutide-Weight Management 0.5 MG/0.5ML SOAJ Inject 0.5 mg into the skin once a week for 28 days. 2 mL 0   [START ON 06/29/2023] Semaglutide-Weight Management 1 MG/0.5ML SOAJ Inject 1 mg into the skin once a week for 28 days. 2 mL 0   [START ON 07/28/2023] Semaglutide-Weight Management 1.7 MG/0.75ML SOAJ Inject 1.7 mg into the skin once a week for 28 days. 3 mL 0   [START ON 08/26/2023] Semaglutide-Weight Management 2.4 MG/0.75ML SOAJ Inject 2.4 mg into the skin once a week for 28 days. 3 mL 0   sucralfate (CARAFATE) 1 g tablet Take 1 g by mouth 3 (three) times daily.     [START ON 08/05/2023] tirzepatide (ZEPBOUND) 10 MG/0.5ML Pen Inject 10 mg into the skin once a week for 4 doses. Ok to go up to this dose after weeks stable on the dose 2.5 mg lower than this 2 mL 0   [START ON 09/02/2023] tirzepatide (ZEPBOUND) 12.5 MG/0.5ML Pen Inject 12.5 mg into the skin once a week for 4 doses. Ok to go up to this dose after weeks stable on the dose 2.5 mg lower than this 2 mL 0   [START ON 09/20/2023] tirzepatide (ZEPBOUND) 15 MG/0.5ML Pen Inject 15 mg into the skin once a week for 4 doses. Ok to go up to this dose after weeks stable on the dose 2.5 mg lower than this  2 mL 0   [START ON 06/10/2023] tirzepatide (ZEPBOUND) 5 MG/0.5ML Pen Inject 5 mg into the skin once a week for 4 doses. Ok to go up to this dose after weeks stable on the dose 2.5 mg lower than this 2 mL 0   [START ON 07/08/2023] tirzepatide (ZEPBOUND) 7.5 MG/0.5ML Pen Inject 7.5 mg into the skin once a week for 4 doses. Ok to go up to this dose after weeks stable on the dose 2.5 mg lower than this 2 mL 0   No current facility-administered medications for this visit.     Musculoskeletal: Strength & Muscle Tone: within normal limits Gait & Station: normal Patient leans: N/A  Psychiatric Specialty Exam: Review  of Systems  Psychiatric/Behavioral:  Positive for dysphoric mood. Negative for decreased concentration, hallucinations, self-injury, sleep disturbance and suicidal ideas. The patient is nervous/anxious. The patient is not hyperactive.     Blood pressure (!) 160/79, pulse 89, height 5\' 3"  (1.6 m), weight 146 lb 12.8 oz (66.6 kg), last menstrual period 12/20/2011, SpO2 100%.Body mass index is 26 kg/m.  General Appearance: Casual  Eye Contact:  Good  Speech:  Clear and Coherent and Normal Rate  Volume:  Normal  Mood:  Anxious and Dysphoric  Affect:  Appropriate  Thought Process:  Coherent, Goal Directed, and Descriptions of Associations: Intact  Orientation:  Full (Time, Place, and Person)  Thought Content: WDL   Suicidal Thoughts:  No  Homicidal Thoughts:  No  Memory:  Immediate;   Good Recent;   Good Remote;   Good  Judgement:  Good  Insight:  Good  Psychomotor Activity:  Normal  Concentration:  Concentration: Good and Attention Span: Good  Recall:  Good  Fund of Knowledge: Good  Language: Good  Akathisia:  No  Handed:  Right  AIMS (if indicated): not done  Assets:  Communication Skills Desire for Improvement Housing Social Support Transportation Vocational/Educational  ADL's:  Intact  Cognition: WNL  Sleep:  Good   Screenings: GAD-7    Flowsheet Row Clinical  Support from 06/06/2023 in [facility] Counselor from 05/31/2023 in [facility] Clinical Support from 05/22/2023 in [facility] Office Visit from 05/02/2023 in [facility] Clinical Support from 03/20/2023 in [facility]  Total GAD-7 Score 12 14 21 20 18       PHQ2-9    Flowsheet Row Clinical Support from 06/06/2023 in [facility] Counselor from 05/31/2023 in [facility] Clinical Support from 05/22/2023 in [facility] Office Visit from 05/02/2023 in [facility] Clinical Support from 03/20/2023 in [facility]  PHQ-2 Total Score 3 2 4 4 2   PHQ-9 Total Score 8 7 15 15 6       Flowsheet Row Clinical Support from 06/06/2023 in [facility] Counselor from 05/31/2023 in [facility] Clinical Support from 05/22/2023 in [facility]  C-SSRS RISK CATEGORY No Risk No Risk No Risk        Assessment and Plan:   Ardyn M. Racy is a 57 year old female with a past psychiatric history significant for generalized anxiety disorder and major depressive disorder who presents to [facility] for follow-up and medication management.  Patient presents today encounter stating that she continues to experience depression attributed to missing her mother, being the mediator of her family, and not seeing eye to eye with her husband.  She also endorses fluctuating anxiety and attributes her anxiety to her husband.  Patient has been on several psychiatric medications in the past for the management of her depression and anxiety.  Provider recommended venlafaxine XR (Effexor XR) 37.5 mg for 6 days, followed  by 75 mg daily for the management of her depressive symptoms and anxiety.  Patient was agreeable to recommendation.  Patient's medication to be e-prescribed to pharmacy of choice.  Collaboration of Care: Collaboration of Care: Medication Management AEB provider managing patient's psychiatric medications, Primary Care Provider AEB patient being seen by her primary care provider, Psychiatrist AEB patient being seen  by mental health provider at this facility, and Other provider involved in patient's care AEB patient being seen by neurology, cardiology, and OB/GYN  Patient/Guardian was advised Release of Information must be obtained prior to any record release in order to collaborate their care with an outside provider. Patient/Guardian was advised if they have not already done so to contact the registration department to sign all necessary forms in order for [place] to release information regarding their care.   Consent: Patient/Guardian gives verbal consent for treatment and assignment of benefits for services provided during this visit. Patient/Guardian expressed understanding and agreed to proceed.   1. Moderate episode of recurrent major depressive disorder (HCC)  - venlafaxine XR (EFFEXOR-XR) 37.5 MG 24 hr capsule; Take 1 capsule (37.5 mg total) by mouth daily with breakfast for 6 days, THEN 2 capsules (75 mg total) daily with breakfast.  Dispense: 60 capsule; Refill: 1  2. Generalized anxiety disorder  - venlafaxine XR (EFFEXOR-XR) 37.5 MG 24 hr capsule; Take 1 capsule (37.5 mg total) by mouth daily with breakfast for 6 days, THEN 2 capsules (75 mg total) daily with breakfast.  Dispense: 60 capsule; Refill: 1 - clonazePAM (KLONOPIN) 2 MG tablet; Take 1 tablet (2 mg total) by mouth at bedtime.  Dispense: 30 tablet; Refill: 1  3. Long-term current use of benzodiazepine  Patient to follow-up in 2 months Provider spent a total of 42 minutes with the patient/reviewing patient's chart  Meta Hatchet,  PA 06/06/2023, 6:36 PM

## 2023-06-07 ENCOUNTER — Other Ambulatory Visit (HOSPITAL_COMMUNITY): Payer: Self-pay | Admitting: Physician Assistant

## 2023-06-07 DIAGNOSIS — F331 Major depressive disorder, recurrent, moderate: Secondary | ICD-10-CM

## 2023-06-07 DIAGNOSIS — F411 Generalized anxiety disorder: Secondary | ICD-10-CM

## 2023-06-08 ENCOUNTER — Telehealth (HOSPITAL_COMMUNITY): Payer: Self-pay

## 2023-06-08 NOTE — Telephone Encounter (Signed)
Medication problem - Call from patient stating her insurance would not cover her new Venlafaxine XR 37.5 mg order as written. Pt stated her [facility] gave her 6 pills for the initial start but then patient would need an order for the 75 mg dosage for the increase.  Agreed to send message to Otila Back, PA-C to inform patient would need a new order for Venlafaxine XR 75 mg, 1 a day to be sent into her [facility] for patient to begin once she finishes her 6 day start with 37.5 mg dosage.  Patient to call back if any issues with starting the medication as she reported doing this morning and with later scheduled taper increase.

## 2023-06-14 ENCOUNTER — Other Ambulatory Visit: Payer: Self-pay

## 2023-06-14 ENCOUNTER — Encounter (HOSPITAL_COMMUNITY): Payer: Self-pay

## 2023-06-14 ENCOUNTER — Other Ambulatory Visit (HOSPITAL_COMMUNITY): Payer: Self-pay | Admitting: Physician Assistant

## 2023-06-14 ENCOUNTER — Emergency Department (HOSPITAL_COMMUNITY)
Admission: EM | Admit: 2023-06-14 | Discharge: 2023-06-15 | Disposition: A | Discharge status: Home / Self Care | Payer: Medicaid Other | Attending: Emergency Medicine | Admitting: Emergency Medicine

## 2023-06-14 DIAGNOSIS — I10 Essential (primary) hypertension: Secondary | ICD-10-CM | POA: Insufficient documentation

## 2023-06-14 DIAGNOSIS — R109 Unspecified abdominal pain: Secondary | ICD-10-CM | POA: Diagnosis present

## 2023-06-14 DIAGNOSIS — K922 Gastrointestinal hemorrhage, unspecified: Secondary | ICD-10-CM | POA: Diagnosis not present

## 2023-06-14 DIAGNOSIS — R1030 Lower abdominal pain, unspecified: Secondary | ICD-10-CM | POA: Diagnosis not present

## 2023-06-14 DIAGNOSIS — J9811 Atelectasis: Secondary | ICD-10-CM | POA: Diagnosis not present

## 2023-06-14 DIAGNOSIS — K529 Noninfective gastroenteritis and colitis, unspecified: Secondary | ICD-10-CM | POA: Insufficient documentation

## 2023-06-14 DIAGNOSIS — Z79899 Other long term (current) drug therapy: Secondary | ICD-10-CM | POA: Insufficient documentation

## 2023-06-14 DIAGNOSIS — F331 Major depressive disorder, recurrent, moderate: Secondary | ICD-10-CM

## 2023-06-14 DIAGNOSIS — F411 Generalized anxiety disorder: Secondary | ICD-10-CM

## 2023-06-14 DIAGNOSIS — K625 Hemorrhage of anus and rectum: Secondary | ICD-10-CM | POA: Diagnosis not present

## 2023-06-14 LAB — CBC WITH DIFFERENTIAL/PLATELET
Abs Immature Granulocytes: 0.1 10*3/uL — ABNORMAL HIGH (ref 0.00–0.07)
Basophils Absolute: 0 10*3/uL (ref 0.0–0.1)
Basophils Relative: 0 %
Eosinophils Absolute: 0 10*3/uL (ref 0.0–0.5)
Eosinophils Relative: 0 %
HCT: 31.4 % — ABNORMAL LOW (ref 36.0–46.0)
Hemoglobin: 11.5 g/dL — ABNORMAL LOW (ref 12.0–15.0)
Immature Granulocytes: 1 %
Lymphocytes Relative: 6 %
Lymphs Abs: 1 10*3/uL (ref 0.7–4.0)
MCH: 33 pg (ref 26.0–34.0)
MCHC: 36.6 g/dL — ABNORMAL HIGH (ref 30.0–36.0)
MCV: 90 fL (ref 80.0–100.0)
Monocytes Absolute: 0.9 10*3/uL (ref 0.1–1.0)
Monocytes Relative: 5 %
Neutro Abs: 16.3 10*3/uL — ABNORMAL HIGH (ref 1.7–7.7)
Neutrophils Relative %: 88 %
Platelets: 324 10*3/uL (ref 150–400)
RBC: 3.49 MIL/uL — ABNORMAL LOW (ref 3.87–5.11)
RDW: 12.3 % (ref 11.5–15.5)
WBC: 18.4 10*3/uL — ABNORMAL HIGH (ref 4.0–10.5)
nRBC: 0 % (ref 0.0–0.2)

## 2023-06-14 LAB — COMPREHENSIVE METABOLIC PANEL
ALT: 24 U/L (ref 0–44)
AST: 23 U/L (ref 15–41)
Albumin: 4.5 g/dL (ref 3.5–5.0)
Alkaline Phosphatase: 53 U/L (ref 38–126)
Anion gap: 9 (ref 5–15)
BUN: 23 mg/dL — ABNORMAL HIGH (ref 6–20)
CO2: 29 mmol/L (ref 22–32)
Calcium: 9.3 mg/dL (ref 8.9–10.3)
Chloride: 97 mmol/L — ABNORMAL LOW (ref 98–111)
Creatinine, Ser: 0.66 mg/dL (ref 0.44–1.00)
GFR, Estimated: >60 mL/min (ref >60)
Glucose, Bld: 126 mg/dL — ABNORMAL HIGH (ref 70–99)
Potassium: 3.4 mmol/L — ABNORMAL LOW (ref 3.5–5.1)
Sodium: 135 mmol/L (ref 135–145)
Total Bilirubin: 0.7 mg/dL (ref <1.2)
Total Protein: 7.2 g/dL (ref 6.5–8.1)

## 2023-06-14 LAB — LIPASE, BLOOD: Lipase: 24 U/L (ref 11–51)

## 2023-06-14 LAB — PROTIME-INR
INR: 1 (ref 0.8–1.2)
Prothrombin Time: 12.9 s (ref 11.4–15.2)

## 2023-06-14 MED ORDER — VENLAFAXINE HCL ER 75 MG PO CP24: 75.0000 mg | ORAL_CAPSULE | Freq: Every day | ORAL | 1 refills | Status: DC

## 2023-06-14 MED ORDER — VENLAFAXINE HCL ER 37.5 MG PO CP24: 37.5000 mg | ORAL_CAPSULE | Freq: Every day | ORAL | 0 refills | Status: DC

## 2023-06-14 NOTE — ED Provider Triage Note (Signed)
Emergency Medicine Provider Triage Evaluation Note  Shaletha CHARRON SCHOENWALD , a 57 y.o. female  was evaluated in triage.  Pt complains of rectal bleeding. Lower abd cramping which began this morning, worsened this afternoon. Felt like she was going to have diarrhea and only passed BRBPR, Had second episode just PTA. States large volume bright red blood with some clots. No hx of similar, no anticoagulation. Unknown prior hemorrhoids.   Review of Systems  Positive: Rectal bleeding Negative: Lightheadedness, dizziness  Physical Exam  Temp 98.3 F (36.8 C) (Oral)   Ht 5\' 3"  (1.6 m)   Wt 63.5 kg   LMP 12/20/2011   BMI 24.80 kg/m  Gen:   Awake, no distress   Resp:  Normal effort  MSK:   Moves extremities without difficulty  Other:    Medical Decision Making  Medically screening exam initiated at 10:41 PM.  Appropriate orders placed.  [place] DORINDA AYOUB was informed that the remainder of the evaluation will be completed by another provider, this initial triage assessment does not replace that evaluation, and the importance of remaining in the ED until their evaluation is complete.  Abd pain, rectal bleeding   Victoriano Campion A, PA-C 06/14/23 [id number]

## 2023-06-14 NOTE — Telephone Encounter (Signed)
Message acknowledged and reviewed. Patient's medication was resent to pharmacy with different instructions.

## 2023-06-14 NOTE — ED Triage Notes (Signed)
PT had 2 episodes of bright red blood with bowel movements today with severe bilateral lower abdominal pain. Denies N/V/D, SOB

## 2023-06-14 NOTE — Progress Notes (Signed)
Provider was contacted by Rosita Kea. Ladona Ridgel, RN regarding patient's insurance company's refusal to cover prescription as written.  Provider to represcribe patient's medication with different instructions.  Patient's medication to be e-prescribed to pharmacy of choice.

## 2023-06-15 ENCOUNTER — Emergency Department (HOSPITAL_COMMUNITY): Payer: Medicaid Other

## 2023-06-15 DIAGNOSIS — K625 Hemorrhage of anus and rectum: Secondary | ICD-10-CM | POA: Diagnosis not present

## 2023-06-15 DIAGNOSIS — R1030 Lower abdominal pain, unspecified: Secondary | ICD-10-CM | POA: Diagnosis not present

## 2023-06-15 DIAGNOSIS — J9811 Atelectasis: Secondary | ICD-10-CM | POA: Diagnosis not present

## 2023-06-15 LAB — HEMOGLOBIN AND HEMATOCRIT, BLOOD
HCT: 31.5 % — ABNORMAL LOW (ref 36.0–46.0)
Hemoglobin: 11.4 g/dL — ABNORMAL LOW (ref 12.0–15.0)

## 2023-06-15 LAB — POC OCCULT BLOOD, ED: Fecal Occult Bld: NEGATIVE

## 2023-06-15 MED ORDER — OXYCODONE-ACETAMINOPHEN 5-325 MG PO TABS: 1.0000 | ORAL_TABLET | Freq: Four times a day (QID) | ORAL | 0 refills | Status: DC | PRN

## 2023-06-15 MED ORDER — IOHEXOL 350 MG/ML SOLN
100.0000 mL | Freq: Once | INTRAVENOUS | Status: AC | PRN
  Administered 2023-06-15: 100 mL via INTRAVENOUS

## 2023-06-15 MED ORDER — AMOXICILLIN-POT CLAVULANATE 875-125 MG PO TABS: 1.0000 | ORAL_TABLET | Freq: Two times a day (BID) | ORAL | 0 refills | Status: DC

## 2023-06-15 MED ORDER — AMOXICILLIN-POT CLAVULANATE 875-125 MG PO TABS
1.0000 | ORAL_TABLET | Freq: Once | ORAL | Status: AC
  Administered 2023-06-15: 1 via ORAL
  Filled 2023-06-15: qty 1

## 2023-06-15 MED ORDER — SODIUM CHLORIDE (PF) 0.9 % IJ SOLN
INTRAMUSCULAR | Status: AC
  Filled 2023-06-15: qty 50

## 2023-06-15 MED ORDER — ONDANSETRON HCL 4 MG/2ML IJ SOLN
4.0000 mg | Freq: Once | INTRAMUSCULAR | Status: AC
  Administered 2023-06-15: 4 mg via INTRAVENOUS
  Filled 2023-06-15: qty 2

## 2023-06-15 MED ORDER — MORPHINE SULFATE (PF) 4 MG/ML IV SOLN
4.0000 mg | Freq: Once | INTRAVENOUS | Status: AC
Start: 2023-06-15 — End: 2023-06-15
  Administered 2023-06-15: 4 mg via INTRAVENOUS
  Filled 2023-06-15: qty 1

## 2023-06-15 NOTE — Discharge Instructions (Signed)
You were seen today for lower GI bleeding.  This is likely related to colitis.  You will be started on antibiotics.  Follow-up closely with your gastroenterologist.  If you have repeat bleeding, increasing pain, onset of fevers, you should be reevaluated immediately.

## 2023-06-15 NOTE — ED Provider Notes (Signed)
[place] EMERGENCY DEPARTMENT AT [facility] Provider Note   CSN: [id number] Arrival date & time: 06/14/23  2153     History  Chief Complaint  Patient presents with   Abdominal Pain    Amanda White is a 57 y.o. female.  HPI     This is a 57 year old female who presents with concerns for abdominal cramping and bloody stools.  Patient reports she had onset of abdominal cramping yesterday.  She had at least 3 stools that were grossly bloody.  No history of the same.  No nausea or vomiting.  No hematemesis.  No history of diverticulosis.  She is not on any blood thinners.  She has not had any fevers.  Home Medications Prior to Admission medications   Medication Sig Start Date End Date Taking? Authorizing Provider  acetaminophen (TYLENOL) 325 MG tablet Take 650 mg by mouth every 6 (six) hours as needed.    [provider]  carbamazepine (TEGRETOL) 100 MG chewable tablet Chew 1 tablet (100 mg total) by mouth 2 (two) times daily. 03/20/23 03/19/24  Meta Hatchet, PA  celecoxib (CELEBREX) 200 MG capsule Take 1 capsule (200 mg total) by mouth 2 (two) times daily. 12/25/22   Lula Olszewski, MD  ciclopirox (LOPROX) 0.77 % cream Apply topically 2 (two) times daily. 12/25/22   Lula Olszewski, MD  clonazePAM (KLONOPIN) 2 MG tablet Take 1 tablet (2 mg total) by mouth at bedtime. 06/06/23   Nwoko, Tommas Olp, PA  cyclobenzaprine (FLEXERIL) 10 MG tablet Take 1 tablet (10 mg total) by mouth daily as needed for muscle spasms. 09/25/22   Lula Olszewski, MD  diclofenac Sodium (VOLTAREN) 1 % GEL Apply 4 g topically 4 (four) times daily as needed. 12/25/22   Lula Olszewski, MD  famotidine (PEPCID) 20 MG tablet Take 20 mg by mouth 2 (two) times daily. 01/08/23   [provider]  famotidine-calcium carbonate-magnesium hydroxide (PEPCID COMPLETE) 10-800-165 MG chewable tablet Chew 1 tablet by mouth daily as needed. 09/25/22   Lula Olszewski, MD  gabapentin (NEURONTIN)  300 MG capsule Take 1 capsule (300 mg total) by mouth 3 (three) times daily. 10/25/22   Lula Olszewski, MD  lisinopril-hydrochlorothiazide (ZESTORETIC) 10-12.5 MG tablet TAKE 1 TABLET BY MOUTH EVERY DAY 04/08/23   Lula Olszewski, MD  ondansetron (ZOFRAN ODT) 4 MG disintegrating tablet Take 1 tablet (4 mg total) by mouth every 8 (eight) hours as needed for nausea or vomiting. 09/25/22   Lula Olszewski, MD  ondansetron (ZOFRAN) 4 MG tablet Take 1 tablet (4 mg total) by mouth every 8 (eight) hours as needed for nausea or vomiting. 03/05/23   Lenn Sink, DPM  pantoprazole (PROTONIX) 40 MG tablet TAKE 1 TABLET (40 MG TOTAL) BY MOUTH TWICE A DAY BEFORE MEALS 03/09/23   Zola Button, Grayling Congress, DO  rosuvastatin (CRESTOR) 10 MG tablet TAKE 1 TABLET (10 MG TOTAL) BY MOUTH DAILY FOR 30 DAYS, THEN 2 TABLETS (20 MG TOTAL) DAILY. 04/11/23 07/09/23  Jodelle Red, MD  Semaglutide-Weight Management 0.5 MG/0.5ML SOAJ Inject 0.5 mg into the skin once a week for 28 days. 05/31/23 06/28/23  Lula Olszewski, MD  Semaglutide-Weight Management 1 MG/0.5ML SOAJ Inject 1 mg into the skin once a week for 28 days. 06/29/23 07/27/23  Lula Olszewski, MD  Semaglutide-Weight Management 1.7 MG/0.75ML SOAJ Inject 1.7 mg into the skin once a week for 28 days. 07/28/23 08/25/23  Lula Olszewski, MD  Semaglutide-Weight  Management 2.4 MG/0.75ML SOAJ Inject 2.4 mg into the skin once a week for 28 days. 08/26/23 09/23/23  Lula Olszewski, MD  sucralfate (CARAFATE) 1 g tablet Take 1 g by mouth 3 (three) times daily. 01/08/23   [provider]  tirzepatide (ZEPBOUND) 10 MG/0.5ML Pen Inject 10 mg into the skin once a week for 4 doses. Ok to go up to this dose after weeks stable on the dose 2.5 mg lower than this 08/05/23 08/27/23  Lula Olszewski, MD  tirzepatide [facility]) 12.5 MG/0.5ML Pen Inject 12.5 mg into the skin once a week for 4 doses. Ok to go up to this dose after weeks stable on the dose 2.5 mg lower than this 09/02/23 09/24/23   Lula Olszewski, MD  tirzepatide [facility]) 15 MG/0.5ML Pen Inject 15 mg into the skin once a week for 4 doses. Ok to go up to this dose after weeks stable on the dose 2.5 mg lower than this 09/20/23 10/12/23  Lula Olszewski, MD  tirzepatide [facility]) 5 MG/0.5ML Pen Inject 5 mg into the skin once a week for 4 doses. Ok to go up to this dose after weeks stable on the dose 2.5 mg lower than this 06/10/23 07/02/23  Lula Olszewski, MD  tirzepatide [facility]) 7.5 MG/0.5ML Pen Inject 7.5 mg into the skin once a week for 4 doses. Ok to go up to this dose after weeks stable on the dose 2.5 mg lower than this 07/08/23 07/30/23  Lula Olszewski, MD  venlafaxine XR (EFFEXOR-XR) 37.5 MG 24 hr capsule Take 1 capsule (37.5 mg total) by mouth daily with breakfast. Patient to then follow up with taking venlafaxine XR 75 mg daily. 06/14/23   Nwoko, Tommas Olp, PA  venlafaxine XR (EFFEXOR-XR) 75 MG 24 hr capsule Take 1 capsule (75 mg total) by mouth daily with breakfast. 06/14/23   Meta Hatchet, PA      Allergies    Patient has no known allergies.    Review of Systems   Review of Systems  Constitutional:  Negative for fever.  Respiratory:  Negative for shortness of breath.   Cardiovascular:  Negative for chest pain.  Gastrointestinal:  Positive for abdominal pain, blood in stool and diarrhea. Negative for nausea and vomiting.  All other systems reviewed and are negative.   Physical Exam Updated Vital Signs BP (!) 141/71   Pulse 85   Temp 98.1 F (36.7 C)   Resp 12   Ht 1.6 m (5\' 3" )   Wt 63.5 kg   LMP 12/20/2011   SpO2 100%   BMI 24.80 kg/m  Physical Exam Vitals and nursing note reviewed. Exam conducted with a chaperone present.  Constitutional:      Appearance: She is well-developed. She is not ill-appearing.  HENT:     Head: Normocephalic and atraumatic.  Eyes:     Pupils: Pupils are equal, round, and reactive to light.  Cardiovascular:     Rate and Rhythm: Normal rate and regular  rhythm.     Heart sounds: Normal heart sounds.  Pulmonary:     Effort: Pulmonary effort is normal. No respiratory distress.     Breath sounds: No wheezing.  Abdominal:     General: Bowel sounds are normal.     Palpations: Abdomen is soft.     Tenderness: There is no abdominal tenderness. There is no guarding or rebound.  Genitourinary:    Rectum: Guaiac result negative.     Comments: No gross blood  per rectum. Musculoskeletal:     Cervical back: Neck supple.  Skin:    General: Skin is warm and dry.  Neurological:     Mental Status: She is alert and oriented to person, place, and time.     ED Results / Procedures / Treatments   Labs (all labs ordered are listed, but only abnormal results are displayed) Labs Reviewed  CBC WITH DIFFERENTIAL/PLATELET - Abnormal; Notable for the following components:      Result Value   WBC 18.4 (*)    RBC 3.49 (*)    Hemoglobin 11.5 (*)    HCT 31.4 (*)    MCHC 36.6 (*)    Neutro Abs 16.3 (*)    Abs Immature Granulocytes 0.10 (*)    All other components within normal limits  COMPREHENSIVE METABOLIC PANEL - Abnormal; Notable for the following components:   Potassium 3.4 (*)    Chloride 97 (*)    Glucose, Bld 126 (*)    BUN 23 (*)    All other components within normal limits  HEMOGLOBIN AND HEMATOCRIT, BLOOD - Abnormal; Notable for the following components:   Hemoglobin 11.4 (*)    HCT 31.5 (*)    All other components within normal limits  LIPASE, BLOOD  PROTIME-INR  POC OCCULT BLOOD, ED    EKG None  Radiology CT ANGIO GI BLEED Result Date: 06/15/2023 CLINICAL DATA:  Lower abdominal cramping and rectal bleeding. EXAM: CTA ABDOMEN AND PELVIS WITHOUT AND WITH CONTRAST TECHNIQUE: Multidetector CT imaging of the abdomen and pelvis was performed using the standard protocol during bolus administration of intravenous contrast. Multiplanar reconstructed images and MIPs were obtained and reviewed to evaluate the vascular anatomy. RADIATION  DOSE REDUCTION: This exam was performed according to the departmental dose-optimization program which includes automated exposure control, adjustment of the mA and/or kV according to patient size and/or use of iterative reconstruction technique. CONTRAST:  OMNIPAQUE IOHEXOL 350 MG/ML SOLN COMPARISON:  10/12/2022 FINDINGS: VASCULAR Aorta: Atheromatous wall thickening.  No dissection or aneurysm Celiac: Small separate origin splenic and left gastric arteries. The hepatic artery is replaced to the SMA. Mild atheromatous calcification. SMA: No stenosis, beading, or major branch occlusion is seen. Renals: Unremarkable IMA: Patent with some hyperenhancement of left-sided colic arteries without discrete vascular lesion. No occlusion or beading. Inflow: Minimal atheromatous change without stenosis, dissection, or aneurysm Proximal Outflow: Limited atheromatous plaque at the right common femoral artery. No stenosis or aneurysm. Veins: No veno occlusive disease or stenosis detected. Review of the MIP images confirms the above findings. NON-VASCULAR Lower chest: No acute finding. Trace pericardial fluid and mild dependent atelectasis. Hepatobiliary: Multiple up attic cysts as previously characterized. Intermediate density in the left superior cyst which measures 2.2 cm, previously demonstrated to be proteinaceous/hemorrhagic head MRI. No superimposed worrisome finding.No evidence of biliary obstruction or stone. Pancreas: Unremarkable. Spleen: Unremarkable. Adrenals/Urinary Tract: Negative adrenals. No hydronephrosis or stone. Unremarkable bladder. Stomach/Bowel: Intense submucosal low-density thickening and edema affecting the descending colon with mild distal transverse involvement as well. The pericolonic fat is reticulated. No skip lesion or perforation. Lymphatic: No mass or adenopathy. Reproductive:No pathologic findings. Other: No ascites or pneumoperitoneum. Musculoskeletal: No acute abnormalities. IMPRESSION: 1.  Prominent descending colitis. No underlying arterial or venous occlusive disease. 2. Atherosclerosis without significant stenosis of major arteries. Electronically Signed   By: Tiburcio Pea M.D.   On: 06/15/2023 06:26    Procedures Procedures    Medications Ordered in ED Medications  amoxicillin-clavulanate (AUGMENTIN) 875-125 MG per tablet  1 tablet (has no administration in time range)  iohexol (OMNIPAQUE) 350 MG/ML injection 100 mL (100 mLs Intravenous Contrast Given 06/15/23 0520)  morphine (PF) 4 MG/ML injection 4 mg (4 mg Intravenous Given 06/15/23 0605)  ondansetron (ZOFRAN) injection 4 mg (4 mg Intravenous Given 06/15/23 0604)    ED Course/ Medical Decision Making/ A&P Clinical Course as of 06/15/23 0651  Fri Jun 15, 2023  1610 Patient was CT scan consistent with colitis.  Has not had any recurrent bloody bowel movements here and no active bleeding on exam.  Hemoglobin is stable over 8 hours.  She is not on any blood thinners.  She does have an outpatient gastroenterologist.  Feel is reasonable that she can follow-up as an outpatient given no evidence of ongoing bleeding.  Will start on Augmentin for potential infectious colitis.  She is low risk for ischemic bowel. [CH]    Clinical Course User Index [CH] Dasani Crear, Mayer Masker, MD                                 Medical Decision Making Amount and/or Complexity of Data Reviewed Labs: ordered.  Risk Prescription drug management.   This patient presents to the ED for concern of bloody stools, this involves an extensive number of treatment options, and is a complaint that carries with it a high risk of complications and morbidity.  I considered the following differential and admission for this acute, potentially life threatening condition.  The differential diagnosis includes diverticulosis, colitis, other lower GI bleed, less likely brisk upper GI bleed, hemorrhoid  MDM:    This is a 57 year old female who presents with lower  GI bleeding.  Does report some abdominal cramping as well.  She is nontoxic and vital signs are reassuring.  Patient's not on any blood thinners.  No objective signs of ongoing bleeding here in the emergency department and Hemoccult is negative.  She has not had any further bowel movements here.  Unfortunately she waited in the waiting room for prolonged period of time.  Initial hemoglobin was 11.5.  After my evaluation I was able to get a repeat hemoglobin approximately 7 hours after the original which was stable at 11.4.  She does have a leukocytosis.  CT study GI bleed protocol was obtained and shows evidence of colitis in the descending colon.  Had a long discussion with the patient.  She is fairly low risk as she has not had any ongoing bleeding and she is not on any anticoagulants.  Will start on Augmentin to cover for infectious colitis.  Patient states she has an outpatient gastroenterologist.  She would like to try to go home and I feel that this is very reasonable.  She was given strict return precautions.  Prior to discharge she was able to p.o. challenge.  Low suspicion for ischemic colitis or inflammatory colitis at this point.  (Labs, imaging, consults)  Labs: I Ordered, and personally interpreted labs.  The pertinent results include: CBC, CMP, lipase, PT/INR, Hemoccult  Imaging Studies ordered: I ordered imaging studies including CT GI bleed study I independently visualized and interpreted imaging. I agree with the radiologist interpretation  Additional history obtained from chart review.  External records from outside source obtained and reviewed including prior evaluations  Cardiac Monitoring: The patient was maintained on a cardiac monitor.  If on the cardiac monitor, I personally viewed and interpreted the cardiac monitored which showed an underlying rhythm  of: Sinus  Reevaluation: After the interventions noted above, I reevaluated the patient and found that they have  :improved  Social Determinants of Health:  lives independently  Disposition: Discharge  Co morbidities that complicate the patient evaluation  Past Medical History:  Diagnosis Date   Abnormal cardiac CT angiography 10/12/2022   Small intermediate density pericardial effusion versus thickening. Consider further evaluation with echocardiography.   Anxiety    Bipolar 1 disorder (HCC)    Carpal tunnel syndrome 09/25/2022   R sided release 2022, all better now.   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   Depression    Depression    Dizziness 10/09/2022   Saw emergency room 10/03/22   GERD (gastroesophageal reflux disease)    History of weight loss 12/25/2022   Hypertension    LFT elevation 12/25/2022   Lab Results      Component    Value    Date/Time           ALT    28    12/25/2022 10:31 AM           ALT    99 (H)    09/25/2022 09:53 AM           ALT    22    06/22/2020 01:26 PM           ALT    28    05/19/2018 09:51 PM           ALT    13    01/03/2012 09:55 AM             Lab Results      Component    Value    Date/Time           AST    25    12/25/2022 10:31 AM           AST    40 (H)    0   Migraine    Pressure ulcer 11/28/2022   Subcutaneous nodule 09/25/2022   Unintentional weight loss 09/25/2022   November 28, 2022 interim history:   Denies any heartburn, prior abdomen/flank pain gone, just some right upper chest pain now, gastrointestinal did endoscopy and found ulcer but we don't have report.  Off advil. Weight stable. Continuing remeron         Wt Readings from Last 5 Encounters:  11/28/22  125 lb 6.4 oz (56.9 kg)  11/24/22  125 lb 6.4 oz (56.9 kg)  11/16/22  125 lb 11.2 oz (57 kg)  10/25/22      Medicines Meds ordered this encounter  Medications    iohexol (OMNIPAQUE) 350 MG/ML injection 100 mL   morphine (PF) 4 MG/ML injection 4 mg   ondansetron (ZOFRAN) injection 4 mg   amoxicillin-clavulanate (AUGMENTIN) 875-125 MG per tablet 1 tablet    I have reviewed the patients home medicines and have made adjustments as needed  Problem List / ED Course: Problem List Items Addressed This Visit   None Visit Diagnoses       Lower GI bleeding    -  Primary     Colitis  Final Clinical Impression(s) / ED Diagnoses Final diagnoses:  Lower GI bleeding  Colitis    Rx / DC Orders ED Discharge Orders     None         Shon Baton, MD 06/15/23 [phone]

## 2023-06-18 ENCOUNTER — Ambulatory Visit (INDEPENDENT_AMBULATORY_CARE_PROVIDER_SITE_OTHER): Payer: Medicaid Other | Admitting: Family Medicine

## 2023-06-18 ENCOUNTER — Encounter: Payer: Self-pay | Admitting: Family Medicine

## 2023-06-18 ENCOUNTER — Other Ambulatory Visit: Payer: Self-pay | Admitting: Family Medicine

## 2023-06-18 VITALS — BP 140/80 | HR 89 | Temp 98.7°F | Ht 63.0 in | Wt 144.2 lb

## 2023-06-18 DIAGNOSIS — K529 Noninfective gastroenteritis and colitis, unspecified: Secondary | ICD-10-CM | POA: Diagnosis not present

## 2023-06-18 LAB — CBC WITH DIFFERENTIAL/PLATELET
Basophils Absolute: 0.1 10*3/uL (ref 0.0–0.1)
Basophils Relative: 0.5 % (ref 0.0–3.0)
Eosinophils Absolute: 0.1 10*3/uL (ref 0.0–0.7)
Eosinophils Relative: 1 % (ref 0.0–5.0)
HCT: 33 % — ABNORMAL LOW (ref 36.0–46.0)
Hemoglobin: 11.3 g/dL — ABNORMAL LOW (ref 12.0–15.0)
Lymphocytes Relative: 15.1 % (ref 12.0–46.0)
Lymphs Abs: 1.5 10*3/uL (ref 0.7–4.0)
MCHC: 34.4 g/dL (ref 30.0–36.0)
MCV: 93.9 fL (ref 78.0–100.0)
Monocytes Absolute: 0.8 10*3/uL (ref 0.1–1.0)
Monocytes Relative: 7.6 % (ref 3.0–12.0)
Neutro Abs: 7.6 10*3/uL (ref 1.4–7.7)
Neutrophils Relative %: 75.8 % (ref 43.0–77.0)
Platelets: 384 10*3/uL (ref 150.0–400.0)
RBC: 3.52 Mil/uL — ABNORMAL LOW (ref 3.87–5.11)
RDW: 12.9 % (ref 11.5–15.5)
WBC: 10 10*3/uL (ref 4.0–10.5)

## 2023-06-18 LAB — COMPREHENSIVE METABOLIC PANEL
ALT: 20 U/L (ref 0–35)
AST: 21 U/L (ref 0–37)
Albumin: 4.2 g/dL (ref 3.5–5.2)
Alkaline Phosphatase: 58 U/L (ref 39–117)
BUN: 9 mg/dL (ref 6–23)
CO2: 34 meq/L — ABNORMAL HIGH (ref 19–32)
Calcium: 9.5 mg/dL (ref 8.4–10.5)
Chloride: 99 meq/L (ref 96–112)
Creatinine, Ser: 0.63 mg/dL (ref 0.40–1.20)
GFR: 97.72 mL/min (ref >60.00)
Glucose, Bld: 179 mg/dL — ABNORMAL HIGH (ref 70–99)
Potassium: 4 meq/L (ref 3.5–5.1)
Sodium: 139 meq/L (ref 135–145)
Total Bilirubin: 0.3 mg/dL (ref 0.2–1.2)
Total Protein: 7 g/dL (ref 6.0–8.3)

## 2023-06-18 MED ORDER — OXYCODONE-ACETAMINOPHEN 5-325 MG PO TABS: 1.0000 | ORAL_TABLET | Freq: Four times a day (QID) | ORAL | 0 refills | Status: DC | PRN

## 2023-06-18 NOTE — Progress Notes (Unsigned)
Subjective:     Patient ID: Amanda White, female    DOB: 05-10-1966, 57 y.o.   MRN: [id number]  Chief Complaint  Patient presents with   Hospitalization Follow-up    Micah Flesher to ED on 12/19 for lower GI bleed and colitis. Unable to have a bm.    HPI Seen in ED 06/14/23 for bloody stools/abd pain for 1 day PTA. No f/c.  CT showed descending colitis.  Wbc 18, hgb 11.5.  placed on augmentin.    Pt called GI doctor and has appt 07/04/22.  Still w/cramping lower abdomen.  Some constipation-can go 3-4 days w/o bm. No f/c.   Last cscope 2022.  Small bm today-still mucusy and bloody.  Not hungry so not eating well.  Drinking ensure occ.  Taking percocet for pain-requesting refill  No bp meds yet today.   There are no preventive care reminders to display for this patient.   Past Medical History:  Diagnosis Date   Abnormal cardiac CT angiography 10/12/2022   Small intermediate density pericardial effusion versus thickening. Consider further evaluation with echocardiography.   Anxiety    Bipolar 1 disorder (HCC)    Carpal tunnel syndrome 09/25/2022   R sided release 2022, all better now.   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   Depression    Depression    Dizziness 10/09/2022   Saw emergency room 10/03/22   GERD (gastroesophageal reflux disease)    History of weight loss 12/25/2022   Hypertension    LFT elevation 12/25/2022   Lab Results      Component    Value    Date/Time           ALT    28    12/25/2022 10:31 AM           ALT    99 (H)    09/25/2022 09:53 AM           ALT    22    06/22/2020 01:26 PM           ALT    28    05/19/2018 09:51 PM           ALT    13    01/03/2012 09:55 AM             Lab Results      Component    Value    Date/Time            AST    25    12/25/2022 10:31 AM           AST    40 (H)    0   Migraine    Pressure ulcer 11/28/2022   Subcutaneous nodule 09/25/2022   Unintentional weight loss 09/25/2022   November 28, 2022 interim history:   Denies any heartburn, prior abdomen/flank pain gone, just some right upper chest pain now, gastrointestinal did endoscopy and found ulcer but we don't have report.  Off advil. Weight stable. Continuing remeron         Wt Readings from Last 5 Encounters:  11/28/22  125 lb 6.4 oz (56.9 kg)  11/24/22  125 lb 6.4 oz (56.9 kg)  11/16/22  125 lb 11.2 oz (57  kg)  10/25/22     Past Surgical History:  Procedure Laterality Date   CARPAL TUNNEL RELEASE Right 04/18/2021   Procedure: RIGHT ENDOSCOPIC CARPAL TUNNEL RELEASE;  Surgeon: Mack Hook, MD;  Location: [facility];  Service: Orthopedics;  Laterality: Right;  LENTH OF SURGERY: 30 MINUTES   DILATION AND CURETTAGE OF UTERUS     FOOT SURGERY  02/27/2023   --- BI PLANAR OSTEOTOMY 1ST MPJ RT, FIXATION     Current Outpatient Medications:    acetaminophen (TYLENOL) 325 MG tablet, Take 650 mg by mouth every 6 (six) hours as needed., Disp: , Rfl:    amoxicillin-clavulanate (AUGMENTIN) 875-125 MG tablet, Take 1 tablet by mouth every 12 (twelve) hours., Disp: 20 tablet, Rfl: 0   ciclopirox (LOPROX) 0.77 % cream, Apply topically 2 (two) times daily., Disp: 90 g, Rfl: 2   clonazePAM (KLONOPIN) 2 MG tablet, Take 1 tablet (2 mg total) by mouth at bedtime., Disp: 30 tablet, Rfl: 1   cyclobenzaprine (FLEXERIL) 10 MG tablet, Take 1 tablet (10 mg total) by mouth daily as needed for muscle spasms., Disp: 30 tablet, Rfl: 2   diclofenac Sodium (VOLTAREN) 1 % GEL, Apply 4 g topically 4 (four) times daily as needed., Disp: 100 g, Rfl: 3   famotidine-calcium carbonate-magnesium hydroxide (PEPCID COMPLETE) 10-800-165 MG chewable tablet, Chew 1 tablet by mouth daily as needed., Disp: 100 tablet, Rfl: 11   gabapentin (NEURONTIN) 300 MG capsule,  Take 1 capsule (300 mg total) by mouth 3 (three) times daily., Disp: 270 capsule, Rfl: 3   lisinopril-hydrochlorothiazide (ZESTORETIC) 10-12.5 MG tablet, TAKE 1 TABLET BY MOUTH EVERY DAY, Disp: 90 tablet, Rfl: 1   omeprazole (PRILOSEC) 40 MG capsule, Take 40 mg by mouth every morning., Disp: , Rfl:    ondansetron (ZOFRAN ODT) 4 MG disintegrating tablet, Take 1 tablet (4 mg total) by mouth every 8 (eight) hours as needed for nausea or vomiting., Disp: 20 tablet, Rfl: 0   ondansetron (ZOFRAN) 4 MG tablet, Take 1 tablet (4 mg total) by mouth every 8 (eight) hours as needed for nausea or vomiting., Disp: 20 tablet, Rfl: 0   rosuvastatin (CRESTOR) 10 MG tablet, TAKE 1 TABLET (10 MG TOTAL) BY MOUTH DAILY FOR 30 DAYS, THEN 2 TABLETS (20 MG TOTAL) DAILY., Disp: 180 tablet, Rfl: 1   venlafaxine XR (EFFEXOR-XR) 75 MG 24 hr capsule, Take 1 capsule (75 mg total) by mouth daily with breakfast., Disp: 30 capsule, Rfl: 1   oxyCODONE-acetaminophen (PERCOCET/ROXICET) 5-325 MG tablet, Take 1 tablet by mouth every 6 (six) hours as needed for severe pain (pain score 7-10)., Disp: 10 tablet, Rfl: 0  No Known Allergies ROS neg/noncontributory except as noted HPI/below      Objective:     BP (!) 140/80   Pulse 89   Temp 98.7 F (37.1 C) (Temporal)   Ht 5\' 3"  (1.6 m)   Wt 144 lb 3.2 oz (65.4 kg)   LMP 12/20/2011   SpO2 97%   BMI 25.54 kg/m  Wt Readings from Last 3 Encounters:  06/18/23 144 lb 3.2 oz (65.4 kg)  06/14/23 140 lb (63.5 kg)  05/02/23 140 lb 6.4 oz (63.7 kg)    Physical Exam   Gen: WDWN NAD HEENT: NCAT, conjunctiva not injected, sclera nonicteric NECK:  supple, no thyromegaly, no nodes, no carotid bruits CARDIAC: RRR, S1S2+, no murmur.  LUNGS: CTAB. No wheezes ABDOMEN:  BS+, soft, mildly diffusely tender. No HSM, no masses EXT:  no edema MSK: no gross abnormalities.  NEURO: A&O x3.  CN II-XII intact.  PSYCH: normal mood. Good eye contact  PDMP checked Reviewd ER labs/CT      Assessment & Plan:  Colitis -     CBC with Differential/Platelet -     Comprehensive metabolic panel  Other orders -     oxyCODONE-Acetaminophen; Take 1 tablet by mouth every 6 (six) hours as needed for severe pain (pain score 7-10).  Dispense: 10 tablet; Refill: 0   Colitis w/bleeding.  Advised takes time to resolve.  Finish augmentin.  If increased pain, increased bleeding, f/c-ER.  Advised to take colace 100mg  daily and miralax daily for constipation esp being on opioids.  Will sent 10 more oxy in, but no more.  Has appt wGI in 2 wks.  Will repeat labs as wbc were high and K low.  Advised to keep nutrition up-so can do protein drinks if not wanting to eat much.  Return if symptoms worsen or fail to improve.  Angelena Sole, MD

## 2023-06-18 NOTE — Patient Instructions (Addendum)
It was very nice to see you today!  Happy Holidays  Colace 100mg  daily and miralax daily  If worse, fevers, etc ER   PLEASE NOTE:  If you had any lab tests please let us know if you have not heard back within a few days. You may see your results on MyChart before we have a chance to review them but we will give you a call once they are reviewed by [place]. If we ordered any referrals today, please let us know if you have not heard from their office within the next week.   Please try these tips to maintain a healthy lifestyle:  Eat most of your calories during the day when you are active. Eliminate processed foods including packaged sweets (pies, cakes, cookies), reduce intake of potatoes, white bread, white pasta, and white rice. Look for whole grain options, oat flour or almond flour.  Each meal should contain half fruits/vegetables, one quarter protein, and one quarter carbs (no bigger than a computer mouse).  Cut down on sweet beverages. This includes juice, soda, and sweet tea. Also watch fruit intake, though this is a healthier sweet option, it still contains natural sugar! Limit to 3 servings daily.  Drink at least 1 glass of water with each meal and aim for at least 8 glasses per day  Exercise at least 150 minutes every week.

## 2023-06-19 NOTE — Progress Notes (Signed)
Wbc have improved.  Sugar was a little elevated-not sure if she had just eaten or drank something.

## 2023-06-27 DIAGNOSIS — Z419 Encounter for procedure for purposes other than remedying health state, unspecified: Secondary | ICD-10-CM | POA: Diagnosis not present

## 2023-07-05 ENCOUNTER — Encounter: Payer: Self-pay | Admitting: Internal Medicine

## 2023-07-05 ENCOUNTER — Ambulatory Visit (INDEPENDENT_AMBULATORY_CARE_PROVIDER_SITE_OTHER): Payer: Medicaid Other | Admitting: Internal Medicine

## 2023-07-05 VITALS — BP 122/74 | HR 98 | Temp 97.2°F | Ht 63.0 in

## 2023-07-05 DIAGNOSIS — R051 Acute cough: Secondary | ICD-10-CM

## 2023-07-05 DIAGNOSIS — J029 Acute pharyngitis, unspecified: Secondary | ICD-10-CM | POA: Diagnosis not present

## 2023-07-05 DIAGNOSIS — R519 Headache, unspecified: Secondary | ICD-10-CM

## 2023-07-05 DIAGNOSIS — U071 COVID-19: Secondary | ICD-10-CM

## 2023-07-05 DIAGNOSIS — R0981 Nasal congestion: Secondary | ICD-10-CM

## 2023-07-05 DIAGNOSIS — R6889 Other general symptoms and signs: Secondary | ICD-10-CM | POA: Diagnosis not present

## 2023-07-05 LAB — POCT INFLUENZA A/B
Influenza A, POC: NEGATIVE
Influenza B, POC: NEGATIVE

## 2023-07-05 LAB — POCT RAPID STREP A (OFFICE): Rapid Strep A Screen: NEGATIVE

## 2023-07-05 LAB — POC COVID19 BINAXNOW: SARS Coronavirus 2 Ag: POSITIVE — AB

## 2023-07-05 MED ORDER — PROMETHAZINE-DM 6.25-15 MG/5ML PO SYRP: 5.0000 mL | ORAL_SOLUTION | Freq: Four times a day (QID) | ORAL | 0 refills | Status: DC | PRN

## 2023-07-05 MED ORDER — NIRMATRELVIR/RITONAVIR (PAXLOVID)TABLET: 3.0000 | ORAL_TABLET | Freq: Two times a day (BID) | ORAL | 0 refills | Status: AC

## 2023-07-05 NOTE — Patient Instructions (Addendum)
 Medication Adjustments During Paxlovid  Treatment  You are currently taking several medications. To ensure your safety while taking Paxlovid  for COVID-19, we need to make some temporary adjustments to your medication schedule:  Medications to Temporarily Stop:  Acetaminophen  (TYLENOL ) Amoxicillin -clavulanate (AUGMENTIN ) Ciclopirox  (LOPROX ) Clonazepam  (KLONOPIN ) Cyclobenzaprine  (FLEXERIL ) Diclofenac  Sodium (VOLTAREN ) Famotidine -calcium  carbonate-magnesium  hydroxide (PEPCID  COMPLETE) Gabapentin  (NEURONTIN ) Lisinopril -hydrochlorothiazide  (ZESTORETIC ) Omeprazole (PRILOSEC) Ondansetron  (ZOFRAN  ODT) Ondansetron  (ZOFRAN ) Oxycodone -acetaminophen  (PERCOCET/ROXICET) Promethazine -dextromethorphan (PROMETHAZINE -DM) Rosuvastatin  (CRESTOR ) Venlafaxine  XR (EFFEXOR -XR) Medications to Continue:  Nirmatrelvir /ritonavir  (PAXLOVID ) - Continue as prescribed. Important Note:  You will restart your other medications after completing your course of Paxlovid . Please monitor for any potential side effects or interactions during Paxlovid  treatment. If you experience any new or worsening symptoms, please contact your doctor or pharmacist immediately.  VISIT SUMMARY:  During today's visit, we discussed your recent COVID-19 infection and its symptoms, including sore throat, headache, chest pain, and persistent cough. We also reviewed your history of hyperlipidemia and addressed concerns about dehydration and inadequate nutrition.  YOUR PLAN:  -COVID-19 INFECTION: You have a confirmed COVID-19 infection, which is a viral illness that can cause symptoms like sore throat, headache, chest pain, and persistent cough. We prescribed Paxlovid , an antiviral medication, to help improve your symptoms within one to two days. It's important to complete the full five-day course to avoid rebound infection. For your cough, we prescribed Phenergan  DM, and for sinus congestion, we recommended using a sinus rinse with saline  spray and Flonase spray. You should quarantine for ten days from the onset of symptoms to prevent spreading the virus to others. Please call us  if your symptoms do not improve.  -HYPERLIPIDEMIA: Hyperlipidemia is a condition where you have high levels of lipids (fats) in your blood. You are currently being treated with rosuvastatin , which we will temporarily stop while you are taking Paxlovid  to avoid any drug interactions. We will reassess your lipid management after you complete the Paxlovid  course.  -DEHYDRATION AND INADEQUATE NUTRITION: Dehydration occurs when your body does not have enough fluids, and inadequate nutrition means you are not getting enough nutrients from your diet. We encourage you to drink plenty of fluids and eat a balanced diet to help your body recover.  INSTRUCTIONS:  Please call us  if your symptoms do not improve with the current plan. Remember to quarantine for ten days from the onset of your symptoms. If you need an excuse letter for work, let us  know.

## 2023-07-05 NOTE — Progress Notes (Signed)
 ==============================  [place] [facility]: [phone]   -- Medical Office Visit --  Patient: Amanda White      Age: 58 y.o.       Sex:  female  Date:   07/05/2023 Today's Healthcare Provider: Bernardino KANDICE Cone, MD  ==============================   CHIEF COMPLAINT: Cough (Productive, producing thick yellow greenish mucus. All symptoms for about four days. No fever.), Nasal Congestion (Stuffy.), Sinus Problem, Headache, and Sore Throat  SUBJECTIVE: 58 y.o. female who has Hyperlipidemia; Benzodiazepine dependence (HCC); GERD (gastroesophageal reflux disease); Lumbago of lumbar region with sciatica; Podagra; Hypertension; Generalized anxiety disorder; Memory impairment; Subcutaneous nodule; RUQ abdominal tenderness; Cystic disease of liver; Right-sided chest pain; Cyst of gallbladder; Aortic atherosclerosis (HCC); History of colon polyps; DDD (degenerative disc disease), thoracolumbar; Moderate episode of recurrent major depressive disorder (HCC); Long-term current use of benzodiazepine; PUD (peptic ulcer disease); Fecal retention; High gamma glutamyl transferase (GGT); Arthritis of first metatarsophalangeal (MTP) joint of right foot; Onychomycosis; Family history of rheumatoid arthritis; History of weight loss; Hallux limitus of right foot; Statin intolerance; Pericardial effusion; History of peptic ulcer; Overweight; Other fatigue; and Other insomnia on their problem list.  History of Present Illness The patient, with a history of hyperlipidemia, presents with a four-day history of worsening symptoms consistent with COVID-19. She initially experienced nasal congestion and severe headaches, which then progressed to a sore throat and persistent coughing. The patient has been self-medicating with Theraflu, DayQuil, and Tylenol , with minimal relief. She reports that the coughing is particularly severe at night, causing significant discomfort and disturbance  to others in the household.  The patient also reports feeling anxious, which may be contributing to her elevated heart rate. She admits to potential dehydration, having consumed only Theraflu and a smoothie on the day of the consultation. She has not been eating well due to her symptoms.  The patient has a long-standing palpable lymph node, which she reports has been present for years. She also has a history of sinus issues, which she has been managing with over-the-counter medications.  The patient lives with her husband, who is currently asymptomatic, and their son, who has been caring for her at home. She expresses concern about potential exposure to her six-year-old grandson, who is due to visit. 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  Note that patient  has a past medical history of Abnormal cardiac CT angiography (10/12/2022), Anxiety, Bipolar 1 disorder (HCC), Carpal tunnel syndrome (09/25/2022), DDD (degenerative disc disease), thoracolumbar (10/25/2022), Depression, Depression, Dizziness (10/09/2022), GERD (gastroesophageal reflux disease), History of weight loss (12/25/2022), Hypertension, LFT elevation (12/25/2022), Migraine, Pressure ulcer (11/28/2022), Subcutaneous nodule (09/25/2022), and Unintentional weight loss (09/25/2022).  Problem list overviews that were updated at today's visit:No problems updated.  Med reconciliation: Current Outpatient Medications on File Prior to Visit  Medication Sig   acetaminophen  (TYLENOL ) 325 MG tablet Take 650 mg by mouth every 6 (six) hours as needed.   amoxicillin -clavulanate (AUGMENTIN ) 875-125 MG tablet Take 1 tablet by mouth every 12 (twelve) hours.   ciclopirox  (LOPROX ) 0.77 % cream Apply topically 2 (two) times daily.   clonazePAM  (KLONOPIN ) 2 MG tablet Take 1 tablet (2 mg total) by mouth at bedtime.   cyclobenzaprine  (FLEXERIL ) 10 MG tablet Take 1 tablet (10 mg total) by mouth daily as  needed for muscle spasms.   diclofenac  Sodium (VOLTAREN ) 1 % GEL Apply 4 g topically 4 (four) times daily as needed.  famotidine -calcium  carbonate-magnesium  hydroxide (PEPCID  COMPLETE) 10-800-165 MG chewable tablet Chew 1 tablet by mouth daily as needed.   gabapentin  (NEURONTIN ) 300 MG capsule Take 1 capsule (300 mg total) by mouth 3 (three) times daily.   lisinopril -hydrochlorothiazide  (ZESTORETIC ) 10-12.5 MG tablet TAKE 1 TABLET BY MOUTH EVERY DAY   omeprazole (PRILOSEC) 40 MG capsule Take 40 mg by mouth every morning.   ondansetron  (ZOFRAN  ODT) 4 MG disintegrating tablet Take 1 tablet (4 mg total) by mouth every 8 (eight) hours as needed for nausea or vomiting.   ondansetron  (ZOFRAN ) 4 MG tablet Take 1 tablet (4 mg total) by mouth every 8 (eight) hours as needed for nausea or vomiting.   oxyCODONE -acetaminophen  (PERCOCET/ROXICET) 5-325 MG tablet Take 1 tablet by mouth every 6 (six) hours as needed for severe pain (pain score 7-10).   rosuvastatin  (CRESTOR ) 10 MG tablet TAKE 1 TABLET (10 MG TOTAL) BY MOUTH DAILY FOR 30 DAYS, THEN 2 TABLETS (20 MG TOTAL) DAILY.   venlafaxine  XR (EFFEXOR -XR) 75 MG 24 hr capsule Take 1 capsule (75 mg total) by mouth daily with breakfast.   No current facility-administered medications on file prior to visit.  There are no discontinued medications.    Objective   Physical Exam     07/05/2023    2:12 PM 06/18/2023    1:06 PM 06/18/2023   12:59 PM  Vitals with BMI  Height 5' 3  5' 3  Weight --  144 lbs 3 oz  BMI   25.55  Systolic 122 140 859  Diastolic 74 80 80  Pulse 98  89   Wt Readings from Last 10 Encounters:  06/18/23 144 lb 3.2 oz (65.4 kg)  06/14/23 140 lb (63.5 kg)  05/02/23 140 lb 6.4 oz (63.7 kg)  03/28/23 139 lb (63 kg)  03/05/23 136 lb 3.2 oz (61.8 kg)  02/02/23 129 lb 6.4 oz (58.7 kg)  01/31/23 132 lb 3.2 oz (60 kg)  12/25/22 129 lb 6.4 oz (58.7 kg)  12/05/22 128 lb 1.6 oz (58.1 kg)  11/28/22 125 lb 6.4 oz (56.9 kg)   Vital signs  reviewed.  Nursing notes reviewed. Weight trend reviewed. Abnormalities and Problem-Specific physical exam findings:  clear to auscultation bilaterally, lots of sinus congestion visible, oropharynx clear.  General Appearance:  No acute distress appreciable.   Well-groomed, healthy-appearing female.  Well proportioned with no abnormal fat distribution.  Good muscle tone. Pulmonary:  Normal work of breathing at rest, no respiratory distress apparent. SpO2: 98 %  Musculoskeletal: All extremities are intact.  Neurological:  Awake, alert, oriented, and engaged.  No obvious focal neurological deficits or cognitive impairments.  Sensorium seems unclouded.   Speech is clear and coherent with logical content. Psychiatric:  Appropriate mood, pleasant and cooperative demeanor, thoughtful and engaged during the exam    Results for orders placed or performed in visit on 07/05/23  POCT rapid strep A  Result Value Ref Range   Rapid Strep A Screen Negative Negative  POC COVID-19  Result Value Ref Range   SARS Coronavirus 2 Ag Positive (A) Negative  POCT Influenza A/B  Result Value Ref Range   Influenza A, POC Negative Negative   Influenza B, POC Negative Negative   Office Visit on 07/05/2023  Component Date Value   Rapid Strep A Screen 07/05/2023 Negative    SARS Coronavirus 2 Ag 07/05/2023 Positive (A)    Influenza A, POC 07/05/2023 Negative    Influenza B, POC 07/05/2023 Negative   Office Visit on 06/18/2023  Component Date Value   WBC 06/18/2023 10.0    RBC 06/18/2023 3.52 (L)    Hemoglobin 06/18/2023 11.3 (L)    HCT 06/18/2023 33.0 (L)    MCV 06/18/2023 93.9    MCHC 06/18/2023 34.4    RDW 06/18/2023 12.9    Platelets 06/18/2023 384.0    Neutrophils Relative % 06/18/2023 75.8    Lymphocytes Relative 06/18/2023 15.1    Monocytes Relative 06/18/2023 7.6    Eosinophils Relative 06/18/2023 1.0    Basophils Relative 06/18/2023 0.5    Neutro Abs 06/18/2023 7.6    Lymphs Abs 06/18/2023 1.5     Monocytes Absolute 06/18/2023 0.8    Eosinophils Absolute 06/18/2023 0.1    Basophils Absolute 06/18/2023 0.1    Sodium 06/18/2023 139    Potassium 06/18/2023 4.0    Chloride 06/18/2023 99    CO2 06/18/2023 34 (H)    Glucose, Bld 06/18/2023 179 (H)    BUN 06/18/2023 9    Creatinine, Ser 06/18/2023 0.63    Total Bilirubin 06/18/2023 0.3    Alkaline Phosphatase 06/18/2023 58    AST 06/18/2023 21    ALT 06/18/2023 20    Total Protein 06/18/2023 7.0    Albumin 06/18/2023 4.2    GFR 06/18/2023 97.72    Calcium  06/18/2023 9.5   Admission on 06/14/2023, Discharged on 06/15/2023  Component Date Value   WBC 06/14/2023 18.4 (H)    RBC 06/14/2023 3.49 (L)    Hemoglobin 06/14/2023 11.5 (L)    HCT 06/14/2023 31.4 (L)    MCV 06/14/2023 90.0    MCH 06/14/2023 33.0    MCHC 06/14/2023 36.6 (H)    RDW 06/14/2023 12.3    Platelets 06/14/2023 324    nRBC 06/14/2023 0.0    Neutrophils Relative % 06/14/2023 88    Neutro Abs 06/14/2023 16.3 (H)    Lymphocytes Relative 06/14/2023 6    Lymphs Abs 06/14/2023 1.0    Monocytes Relative 06/14/2023 5    Monocytes Absolute 06/14/2023 0.9    Eosinophils Relative 06/14/2023 0    Eosinophils Absolute 06/14/2023 0.0    Basophils Relative 06/14/2023 0    Basophils Absolute 06/14/2023 0.0    Immature Granulocytes 06/14/2023 1    Abs Immature Granulocytes 06/14/2023 0.10 (H)    Sodium 06/14/2023 135    Potassium 06/14/2023 3.4 (L)    Chloride 06/14/2023 97 (L)    CO2 06/14/2023 29    Glucose, Bld 06/14/2023 126 (H)    BUN 06/14/2023 23 (H)    Creatinine, Ser 06/14/2023 0.66    Calcium  06/14/2023 9.3    Total Protein 06/14/2023 7.2    Albumin 06/14/2023 4.5    AST 06/14/2023 23    ALT 06/14/2023 24    Alkaline Phosphatase 06/14/2023 53    Total Bilirubin 06/14/2023 0.7    GFR, Estimated 06/14/2023 >60    Anion gap 06/14/2023 9    Lipase 06/14/2023 24    Prothrombin Time 06/14/2023 12.9    INR 06/14/2023 1.0    Fecal Occult Bld 06/15/2023  NEGATIVE    Hemoglobin 06/15/2023 11.4 (L)    HCT 06/15/2023 31.5 (L)   Office Visit on 05/02/2023  Component Date Value   WBC 05/02/2023 6.0    RBC 05/02/2023 3.47 (L)    Hemoglobin 05/02/2023 10.9 (L)    HCT 05/02/2023 32.7 (L)    MCV 05/02/2023 94.4    MCHC 05/02/2023 33.4    RDW 05/02/2023 13.6    Platelets 05/02/2023 370.0    Neutrophils Relative %  05/02/2023 57.7    Lymphocytes Relative 05/02/2023 29.8    Monocytes Relative 05/02/2023 9.6    Eosinophils Relative 05/02/2023 1.9    Basophils Relative 05/02/2023 1.0    Neutro Abs 05/02/2023 3.5    Lymphs Abs 05/02/2023 1.8    Monocytes Absolute 05/02/2023 0.6    Eosinophils Absolute 05/02/2023 0.1    Basophils Absolute 05/02/2023 0.1    Sodium 05/02/2023 136    Potassium 05/02/2023 4.6    Chloride 05/02/2023 98    CO2 05/02/2023 33 (H)    Glucose, Bld 05/02/2023 81    BUN 05/02/2023 18    Creatinine, Ser 05/02/2023 0.64    Total Bilirubin 05/02/2023 0.5    Alkaline Phosphatase 05/02/2023 49    AST 05/02/2023 19    ALT 05/02/2023 16    Total Protein 05/02/2023 7.0    Albumin 05/02/2023 4.6    GFR 05/02/2023 97.44    Calcium  05/02/2023 9.7    Cholesterol 05/02/2023 179    Triglycerides 05/02/2023 93.0    HDL 05/02/2023 74.30    VLDL 05/02/2023 18.6    LDL Cholesterol 05/02/2023 86    Total CHOL/HDL Ratio 05/02/2023 2    NonHDL 05/02/2023 104.33    GGT 05/02/2023 38   Appointment on 02/14/2023  Component Date Value   S' Lateral 02/14/2023 2.93    Area-P 1/2 02/14/2023 3.85    MV M vel 02/14/2023 4.59    MV Peak grad 02/14/2023 84.3    Est EF 02/14/2023 55 - 60%   Office Visit on 12/25/2022  Component Date Value   Uric Acid, Serum 12/25/2022 3.8    Sed Rate 12/25/2022 11    CRP 12/25/2022 <1.0    Sodium 12/25/2022 140    Potassium 12/25/2022 4.6    Chloride 12/25/2022 99    CO2 12/25/2022 34 (H)    Glucose, Bld 12/25/2022 94    BUN 12/25/2022 14    Creatinine, Ser 12/25/2022 0.65    Total Bilirubin  12/25/2022 0.4    Alkaline Phosphatase 12/25/2022 50    AST 12/25/2022 25    ALT 12/25/2022 28    Total Protein 12/25/2022 7.4    Albumin 12/25/2022 4.8    GFR 12/25/2022 97.31    Calcium  12/25/2022 10.7 (H)    AFP-Tumor Marker 12/25/2022 9.6 (H)    Cortisol, Plasma 12/25/2022 9.4    C206 ACTH  12/25/2022 14   Office Visit on 11/28/2022  Component Date Value   H pylori Ag, Stl 11/30/2022 Negative   Lab on 11/21/2022  Component Date Value   Sodium 11/21/2022 136    Potassium 11/21/2022 4.1    Chloride 11/21/2022 97    CO2 11/21/2022 30    Glucose, Bld 11/21/2022 93    BUN 11/21/2022 15    Creatinine, Ser 11/21/2022 0.72    GFR 11/21/2022 92.47    Calcium  11/21/2022 9.9    TSH 11/21/2022 1.31    Free T4 11/21/2022 0.76   Office Visit on 11/16/2022  Component Date Value   High risk HPV 11/16/2022 Negative    Adequacy 11/16/2022 Satisfactory for evaluation; transformation zone component PRESENT.    Diagnosis 11/16/2022 - Negative for intraepithelial lesion or malignancy (NILM)    Comment 11/16/2022 Atrophic changes are present.    Comment 11/16/2022 Normal Reference Range HPV - Negative    Glucose, UA 11/16/2022 NEGATIVE    Bilirubin Urine 11/16/2022 NEGATIVE    Ketones, ur 11/16/2022 NEGATIVE    Specific Gravity, Urine 11/16/2022 1.010    Hgb urine  dipstick 11/16/2022 NEGATIVE    pH 11/16/2022 7.0    Protein, ur 11/16/2022 NEGATIVE    Urobilinogen, UA 11/16/2022 0.2    Nitrite 11/16/2022 NEGATIVE    Leukocytes,Ua 11/16/2022 NEGATIVE   Abstract on 11/08/2022  Component Date Value   HM Colonoscopy 05/10/2021 See Report (in chart)   There may be more visits with results that are not included.  No image results found. CT ANGIO GI BLEED Result Date: 06/15/2023 CLINICAL DATA:  Lower abdominal cramping and rectal bleeding. EXAM: CTA ABDOMEN AND PELVIS WITHOUT AND WITH CONTRAST TECHNIQUE: Multidetector CT imaging of the abdomen and pelvis was performed using the standard  protocol during bolus administration of intravenous contrast. Multiplanar reconstructed images and MIPs were obtained and reviewed to evaluate the vascular anatomy. RADIATION DOSE REDUCTION: This exam was performed according to the departmental dose-optimization program which includes automated exposure control, adjustment of the mA and/or kV according to patient size and/or use of iterative reconstruction technique. CONTRAST:  OMNIPAQUE  IOHEXOL  350 MG/ML SOLN COMPARISON:  10/12/2022 FINDINGS: VASCULAR Aorta: Atheromatous wall thickening.  No dissection or aneurysm Celiac: Small separate origin splenic and left gastric arteries. The hepatic artery is replaced to the SMA. Mild atheromatous calcification. SMA: No stenosis, beading, or major branch occlusion is seen. Renals: Unremarkable IMA: Patent with some hyperenhancement of left-sided colic arteries without discrete vascular lesion. No occlusion or beading. Inflow: Minimal atheromatous change without stenosis, dissection, or aneurysm Proximal Outflow: Limited atheromatous plaque at the right common femoral artery. No stenosis or aneurysm. Veins: No veno occlusive disease or stenosis detected. Review of the MIP images confirms the above findings. NON-VASCULAR Lower chest: No acute finding. Trace pericardial fluid and mild dependent atelectasis. Hepatobiliary: Multiple up attic cysts as previously characterized. Intermediate density in the left superior cyst which measures 2.2 cm, previously demonstrated to be proteinaceous/hemorrhagic head MRI. No superimposed worrisome finding.No evidence of biliary obstruction or stone. Pancreas: Unremarkable. Spleen: Unremarkable. Adrenals/Urinary Tract: Negative adrenals. No hydronephrosis or stone. Unremarkable bladder. Stomach/Bowel: Intense submucosal low-density thickening and edema affecting the descending colon with mild distal transverse involvement as well. The pericolonic fat is reticulated. No skip lesion or  perforation. Lymphatic: No mass or adenopathy. Reproductive:No pathologic findings. Other: No ascites or pneumoperitoneum. Musculoskeletal: No acute abnormalities. IMPRESSION: 1. Prominent descending colitis. No underlying arterial or venous occlusive disease. 2. Atherosclerosis without significant stenosis of major arteries. Electronically Signed   By: Dorn Roulette M.D.   On: 06/15/2023 06:26   MM 3D SCREENING MAMMOGRAM BILATERAL BREAST Result Date: 05/15/2023 CLINICAL DATA:  Screening. EXAM: DIGITAL SCREENING BILATERAL MAMMOGRAM WITH TOMOSYNTHESIS AND CAD TECHNIQUE: Bilateral screening digital craniocaudal and mediolateral oblique mammograms were obtained. Bilateral screening digital breast tomosynthesis was performed. The images were evaluated with computer-aided detection. COMPARISON:  Previous exam(s). ACR Breast Density Category c: The breasts are heterogeneously dense, which may obscure small masses. FINDINGS: There are no findings suspicious for malignancy. IMPRESSION: No mammographic evidence of malignancy. A result letter of this screening mammogram will be mailed directly to the patient. RECOMMENDATION: Screening mammogram in one year. (Code:SM-B-01Y) BI-RADS CATEGORY  1: Negative. Electronically Signed   By: Rosaline Collet M.D.   On: 05/15/2023 09:27   DG Foot 2 Views Right Result Date: 05/09/2023 Please see detailed radiograph report in office note. CT ANGIO GI BLEED Result Date: 06/15/2023 CLINICAL DATA:  Lower abdominal cramping and rectal bleeding. EXAM: CTA ABDOMEN AND PELVIS WITHOUT AND WITH CONTRAST TECHNIQUE: Multidetector CT imaging of the abdomen and pelvis was performed using the standard  protocol during bolus administration of intravenous contrast. Multiplanar reconstructed images and MIPs were obtained and reviewed to evaluate the vascular anatomy. RADIATION DOSE REDUCTION: This exam was performed according to the departmental dose-optimization program which includes  automated exposure control, adjustment of the mA and/or kV according to patient size and/or use of iterative reconstruction technique. CONTRAST:  OMNIPAQUE  IOHEXOL  350 MG/ML SOLN COMPARISON:  10/12/2022 FINDINGS: VASCULAR Aorta: Atheromatous wall thickening.  No dissection or aneurysm Celiac: Small separate origin splenic and left gastric arteries. The hepatic artery is replaced to the SMA. Mild atheromatous calcification. SMA: No stenosis, beading, or major branch occlusion is seen. Renals: Unremarkable IMA: Patent with some hyperenhancement of left-sided colic arteries without discrete vascular lesion. No occlusion or beading. Inflow: Minimal atheromatous change without stenosis, dissection, or aneurysm Proximal Outflow: Limited atheromatous plaque at the right common femoral artery. No stenosis or aneurysm. Veins: No veno occlusive disease or stenosis detected. Review of the MIP images confirms the above findings. NON-VASCULAR Lower chest: No acute finding. Trace pericardial fluid and mild dependent atelectasis. Hepatobiliary: Multiple up attic cysts as previously characterized. Intermediate density in the left superior cyst which measures 2.2 cm, previously demonstrated to be proteinaceous/hemorrhagic head MRI. No superimposed worrisome finding.No evidence of biliary obstruction or stone. Pancreas: Unremarkable. Spleen: Unremarkable. Adrenals/Urinary Tract: Negative adrenals. No hydronephrosis or stone. Unremarkable bladder. Stomach/Bowel: Intense submucosal low-density thickening and edema affecting the descending colon with mild distal transverse involvement as well. The pericolonic fat is reticulated. No skip lesion or perforation. Lymphatic: No mass or adenopathy. Reproductive:No pathologic findings. Other: No ascites or pneumoperitoneum. Musculoskeletal: No acute abnormalities. IMPRESSION: 1. Prominent descending colitis. No underlying arterial or venous occlusive disease. 2. Atherosclerosis without  significant stenosis of major arteries. Electronically Signed   By: Dorn Roulette M.D.   On: 06/15/2023 06:26       Assessment & Plan COVID COVID-19 Infection Confirmed COVID-19 infection presents with sore throat, headache, chest pain, and persistent cough, worsening at night, four days into the illness. We discussed the importance of quarantine to prevent spread to vulnerable populations and introduced Paxlovid  as an antiviral targeting COVID-19, expected to improve symptoms within one to two days, emphasizing the necessity of completing the full five-day course to avoid rebound infection. Alternative treatments for symptom relief include prescribing Phenergan  DM for cough and recommending sinus rinse with saline spray, alongside advising Flonase spray for sinus congestion. We will prescribe Paxlovid  with usage instructions and discontinue rosuvastatin  during treatment to prevent drug interactions. Instructions to quarantine for ten days from symptom onset were given, with an offer to write excuse letters if needed. She was advised to call if symptoms do not improve. Sore throat  Acute cough  Stuffy nose  Acute intractable headache, unspecified headache type  Flu-like symptoms      Orders Placed During this Encounter:   Orders Placed This Encounter  Procedures   POCT rapid strep A   POC COVID-19    Previously tested for COVID-19:   No    Resident in a congregate (group) care setting:   No    Employed in healthcare setting:   Unknown    Pregnant:   No   POCT Influenza A/B   Meds ordered this encounter  Medications   nirmatrelvir /ritonavir  (PAXLOVID ) 20 x 150 MG & 10 x 100MG  TABS    Sig: Take 3 tablets by mouth 2 (two) times daily for 5 days. (Take nirmatrelvir  150 mg two tablets twice daily for 5 days and ritonavir  100 mg  one tablet twice daily for 5 days) Patient GFR is over 60    Dispense:  30 tablet    Refill:  0   promethazine -dextromethorphan (PROMETHAZINE -DM) 6.25-15  MG/5ML syrup    Sig: Take 5 mLs by mouth 4 (four) times daily as needed for cough.    Dispense:  118 mL    Refill:  0   Results for orders placed or performed in visit on 07/05/23  POC COVID-19   Collection Time: 07/05/23  2:37 PM  Result Value Ref Range   SARS Coronavirus 2 Ag Positive (A) Negative  POCT Influenza A/B   Collection Time: 07/05/23  2:45 PM  Result Value Ref Range   Influenza A, POC Negative Negative   Influenza B, POC Negative Negative  POCT rapid strep A   Collection Time: 07/05/23  2:46 PM  Result Value Ref Range   Rapid Strep A Screen Negative Negative     Dehydration and Inadequate Nutrition She reported dehydration and inadequate food intake. We encouraged adequate hydration and proper nutrition to address these issues.  Follow-up She was advised to call if symptoms do not improve with the current plan.    This document was synthesized by artificial intelligence ([employer]) using HIPAA-compliant recording of the clinical interaction;   We discussed the use of AI scribe software for clinical note transcription with the patient, who gave verbal consent to proceed.    Additional Info: This encounter employed state-of-the-art, real-time, collaborative documentation. The patient actively reviewed and assisted in updating their electronic medical record on a shared screen, ensuring transparency and facilitating joint problem-solving for the problem list, overview, and plan. This approach promotes accurate, informed care. The treatment plan was discussed and reviewed in detail, including medication safety, potential side effects, and all patient questions. We confirmed understanding and comfort with the plan. Follow-up instructions were established, including contacting the office for any concerns, returning if symptoms worsen, persist, or new symptoms develop, and precautions for potential emergency department visits.

## 2023-07-07 ENCOUNTER — Other Ambulatory Visit (HOSPITAL_COMMUNITY): Payer: Self-pay | Admitting: Physician Assistant

## 2023-07-07 DIAGNOSIS — F331 Major depressive disorder, recurrent, moderate: Secondary | ICD-10-CM

## 2023-07-07 DIAGNOSIS — F411 Generalized anxiety disorder: Secondary | ICD-10-CM

## 2023-07-09 ENCOUNTER — Ambulatory Visit (INDEPENDENT_AMBULATORY_CARE_PROVIDER_SITE_OTHER): Payer: Medicaid Other | Admitting: Internal Medicine

## 2023-07-09 DIAGNOSIS — U071 COVID-19: Secondary | ICD-10-CM | POA: Diagnosis not present

## 2023-07-09 LAB — POC COVID19 BINAXNOW: SARS Coronavirus 2 Ag: NEGATIVE

## 2023-07-09 NOTE — Patient Instructions (Addendum)
 VISIT SUMMARY:  You recently had a follow-up appointment after being diagnosed with COVID-19 and treated with Paxlovid . You reported significant improvement in your condition, with only a minimal residual cough. We discussed your concerns about potentially spreading the infection to your family and others, and you decided against returning to work at this time.  YOUR PLAN:  -COVID-19 INFECTION: COVID-19 is a viral infection that can cause respiratory symptoms. Your symptoms have significantly improved after taking Paxlovid , but you still have a minimal residual cough. We discussed the potential for COVID-19 rebound after treatment and advised you to avoid returning to work until you are fully asymptomatic. You should perform a COVID-19 test and call us  if your symptoms recur for another round of Paxlovid .  -FAMILY ILLNESS: Your husband and son are showing symptoms suggestive of COVID-19, although their home tests were negative. Home tests can sometimes be unreliable. We recommend monitoring your symptoms closely and considering more reliable testing if symptoms persist or worsen.  -GENERAL HEALTH MAINTENANCE: We discussed the importance of personal protection, and I recommend purchasing N95 masks online for your use.  INSTRUCTIONS:  We will call with your COVID-19 test results. Avoid returning to work until you are fully asymptomatic. If your symptoms recur, contact us  for another round of Paxlovid .

## 2023-07-09 NOTE — Progress Notes (Signed)
 ==============================  [facility]: [phone]   -- Medical Office Visit --  Patient: Amanda White      Age: 58 y.o.       Sex:  female  Date:   07/09/2023 Today's Healthcare Provider: Bernardino KANDICE Cone, MD  ==============================   CHIEF COMPLAINT: COVID testing (Wants to be tested again to confirm negative at home test. Vitals were not done due to recently testing positive for COVID on 07/05/23.)   SUBJECTIVE: 58 y.o. female who has Hyperlipidemia; Benzodiazepine dependence (HCC); GERD (gastroesophageal reflux disease); Lumbago of lumbar region with sciatica; Podagra; Hypertension; Generalized anxiety disorder; Memory impairment; Subcutaneous nodule; RUQ abdominal tenderness; Cystic disease of liver; Right-sided chest pain; Cyst of gallbladder; Aortic atherosclerosis (HCC); History of colon polyps; DDD (degenerative disc disease), thoracolumbar; Moderate episode of recurrent major depressive disorder (HCC); Long-term current use of benzodiazepine; PUD (peptic ulcer disease); Fecal retention; High gamma glutamyl transferase (GGT); Arthritis of first metatarsophalangeal (MTP) joint of right foot; Onychomycosis; Family history of rheumatoid arthritis; History of weight loss; Hallux limitus of right foot; Statin intolerance; Pericardial effusion; History of peptic ulcer; Overweight; Other fatigue; and Other insomnia on their problem list.  History of Present Illness The patient, recently diagnosed with COVID-19 and treated with Paxlovid , reports significant improvement in her condition. She describes the medication as working wonders and fantastic. However, she still experiences a residual cough, albeit minimal, which is managed with prescribed cough syrup. She denies any fever throughout the course of her illness.  The patient's family members, specifically her husband and son, are currently ill with symptoms suggestive of COVID-19,  although home tests were negative. The patient expresses concern about potentially spreading the infection, especially considering her family's current health status.  The patient's primary intention for the consultation was to discuss the possibility of returning to work. However, after discussing her ongoing cough and the potential risk of spreading the infection, she decided against returning to work at this time. She expresses a strong desire not to risk infecting others, particularly those who may be more vulnerable due to age or underlying conditions.  Note that patient  has a past medical history of Abnormal cardiac CT angiography (10/12/2022), Anxiety, Bipolar 1 disorder (HCC), Carpal tunnel syndrome (09/25/2022), DDD (degenerative disc disease), thoracolumbar (10/25/2022), Depression, Depression, Dizziness (10/09/2022), GERD (gastroesophageal reflux disease), History of weight loss (12/25/2022), Hypertension, LFT elevation (12/25/2022), Migraine, Pressure ulcer (11/28/2022), Subcutaneous nodule (09/25/2022), and Unintentional weight loss (09/25/2022).  Problem list overviews that were updated at today's visit:No problems updated.  Med reconciliation: Current Outpatient Medications on File Prior to Visit  Medication Sig   acetaminophen  (TYLENOL ) 325 MG tablet Take 650 mg by mouth every 6 (six) hours as needed.   amoxicillin -clavulanate (AUGMENTIN ) 875-125 MG tablet Take 1 tablet by mouth every 12 (twelve) hours.   ciclopirox  (LOPROX ) 0.77 % cream Apply topically 2 (two) times daily.   clonazePAM  (KLONOPIN ) 2 MG tablet Take 1 tablet (2 mg total) by mouth at bedtime.   cyclobenzaprine  (FLEXERIL ) 10 MG tablet Take 1 tablet (10 mg total) by mouth daily as needed for muscle spasms.   diclofenac  Sodium (VOLTAREN ) 1 % GEL Apply 4 g topically 4 (four) times daily as needed.   famotidine -calcium  carbonate-magnesium  hydroxide (PEPCID  COMPLETE) 10-800-165 MG chewable tablet Chew 1 tablet by mouth daily  as needed.   gabapentin  (NEURONTIN ) 300 MG capsule Take 1 capsule (300 mg total) by mouth 3 (three) times daily.   lisinopril -hydrochlorothiazide  (ZESTORETIC ) 10-12.5 MG tablet  TAKE 1 TABLET BY MOUTH EVERY DAY   nirmatrelvir /ritonavir  (PAXLOVID ) 20 x 150 MG & 10 x 100MG  TABS Take 3 tablets by mouth 2 (two) times daily for 5 days. (Take nirmatrelvir  150 mg two tablets twice daily for 5 days and ritonavir  100 mg one tablet twice daily for 5 days) Patient GFR is over 60   omeprazole (PRILOSEC) 40 MG capsule Take 40 mg by mouth every morning.   ondansetron  (ZOFRAN  ODT) 4 MG disintegrating tablet Take 1 tablet (4 mg total) by mouth every 8 (eight) hours as needed for nausea or vomiting.   ondansetron  (ZOFRAN ) 4 MG tablet Take 1 tablet (4 mg total) by mouth every 8 (eight) hours as needed for nausea or vomiting.   oxyCODONE -acetaminophen  (PERCOCET/ROXICET) 5-325 MG tablet Take 1 tablet by mouth every 6 (six) hours as needed for severe pain (pain score 7-10).   promethazine -dextromethorphan (PROMETHAZINE -DM) 6.25-15 MG/5ML syrup Take 5 mLs by mouth 4 (four) times daily as needed for cough.   rosuvastatin  (CRESTOR ) 10 MG tablet TAKE 1 TABLET (10 MG TOTAL) BY MOUTH DAILY FOR 30 DAYS, THEN 2 TABLETS (20 MG TOTAL) DAILY.   venlafaxine  XR (EFFEXOR -XR) 75 MG 24 hr capsule Take 1 capsule (75 mg total) by mouth daily with breakfast.   No current facility-administered medications on file prior to visit.  There are no discontinued medications.   Objective   Physical Exam     07/05/2023    2:12 PM 06/18/2023    1:06 PM 06/18/2023   12:59 PM  Vitals with BMI  Height 5' 3  5' 3  Weight --  144 lbs 3 oz  BMI   25.55  Systolic 122 140 859  Diastolic 74 80 80  Pulse 98  89   Wt Readings from Last 10 Encounters:  06/18/23 144 lb 3.2 oz (65.4 kg)  06/14/23 140 lb (63.5 kg)  05/02/23 140 lb 6.4 oz (63.7 kg)  03/28/23 139 lb (63 kg)  03/05/23 136 lb 3.2 oz (61.8 kg)  02/02/23 129 lb 6.4 oz (58.7 kg)   01/31/23 132 lb 3.2 oz (60 kg)  12/25/22 129 lb 6.4 oz (58.7 kg)  12/05/22 128 lb 1.6 oz (58.1 kg)  11/28/22 125 lb 6.4 oz (56.9 kg)   Vital signs reviewed.  Nursing notes reviewed. Weight trend reviewed. Abnormalities and Problem-Specific physical exam findings:  not coughing, seen outside with her in car wearing mask, limited exam.  General Appearance:  No acute distress appreciable.   Well-groomed, healthy-appearing female.  Well proportioned with no abnormal fat distribution.  Good muscle tone. Pulmonary:  Normal work of breathing at rest, no respiratory distress apparent.    Musculoskeletal: All extremities are intact.  Neurological:  Awake, alert, oriented, and engaged.  No obvious focal neurological deficits or cognitive impairments.  Sensorium seems unclouded.   Speech is clear and coherent with logical content. Psychiatric:  Appropriate mood, pleasant and cooperative demeanor, thoughtful and engaged during the exam    Results for orders placed or performed in visit on 07/09/23  POC COVID-19  Result Value Ref Range   SARS Coronavirus 2 Ag Negative Negative   Office Visit on 07/09/2023  Component Date Value   SARS Coronavirus 2 Ag 07/09/2023 Negative   Office Visit on 07/05/2023  Component Date Value   Rapid Strep A Screen 07/05/2023 Negative    SARS Coronavirus 2 Ag 07/05/2023 Positive (A)    Influenza A, POC 07/05/2023 Negative    Influenza B, POC 07/05/2023 Negative   Office  Visit on 06/18/2023  Component Date Value   WBC 06/18/2023 10.0    RBC 06/18/2023 3.52 (L)    Hemoglobin 06/18/2023 11.3 (L)    HCT 06/18/2023 33.0 (L)    MCV 06/18/2023 93.9    MCHC 06/18/2023 34.4    RDW 06/18/2023 12.9    Platelets 06/18/2023 384.0    Neutrophils Relative % 06/18/2023 75.8    Lymphocytes Relative 06/18/2023 15.1    Monocytes Relative 06/18/2023 7.6    Eosinophils Relative 06/18/2023 1.0    Basophils Relative 06/18/2023 0.5    Neutro Abs 06/18/2023 7.6    Lymphs Abs  06/18/2023 1.5    Monocytes Absolute 06/18/2023 0.8    Eosinophils Absolute 06/18/2023 0.1    Basophils Absolute 06/18/2023 0.1    Sodium 06/18/2023 139    Potassium 06/18/2023 4.0    Chloride 06/18/2023 99    CO2 06/18/2023 34 (H)    Glucose, Bld 06/18/2023 179 (H)    BUN 06/18/2023 9    Creatinine, Ser 06/18/2023 0.63    Total Bilirubin 06/18/2023 0.3    Alkaline Phosphatase 06/18/2023 58    AST 06/18/2023 21    ALT 06/18/2023 20    Total Protein 06/18/2023 7.0    Albumin 06/18/2023 4.2    GFR 06/18/2023 97.72    Calcium  06/18/2023 9.5   Admission on 06/14/2023, Discharged on 06/15/2023  Component Date Value   WBC 06/14/2023 18.4 (H)    RBC 06/14/2023 3.49 (L)    Hemoglobin 06/14/2023 11.5 (L)    HCT 06/14/2023 31.4 (L)    MCV 06/14/2023 90.0    MCH 06/14/2023 33.0    MCHC 06/14/2023 36.6 (H)    RDW 06/14/2023 12.3    Platelets 06/14/2023 324    nRBC 06/14/2023 0.0    Neutrophils Relative % 06/14/2023 88    Neutro Abs 06/14/2023 16.3 (H)    Lymphocytes Relative 06/14/2023 6    Lymphs Abs 06/14/2023 1.0    Monocytes Relative 06/14/2023 5    Monocytes Absolute 06/14/2023 0.9    Eosinophils Relative 06/14/2023 0    Eosinophils Absolute 06/14/2023 0.0    Basophils Relative 06/14/2023 0    Basophils Absolute 06/14/2023 0.0    Immature Granulocytes 06/14/2023 1    Abs Immature Granulocytes 06/14/2023 0.10 (H)    Sodium 06/14/2023 135    Potassium 06/14/2023 3.4 (L)    Chloride 06/14/2023 97 (L)    CO2 06/14/2023 29    Glucose, Bld 06/14/2023 126 (H)    BUN 06/14/2023 23 (H)    Creatinine, Ser 06/14/2023 0.66    Calcium  06/14/2023 9.3    Total Protein 06/14/2023 7.2    Albumin 06/14/2023 4.5    AST 06/14/2023 23    ALT 06/14/2023 24    Alkaline Phosphatase 06/14/2023 53    Total Bilirubin 06/14/2023 0.7    GFR, Estimated 06/14/2023 >60    Anion gap 06/14/2023 9    Lipase 06/14/2023 24    Prothrombin Time 06/14/2023 12.9    INR 06/14/2023 1.0    Fecal Occult  Bld 06/15/2023 NEGATIVE    Hemoglobin 06/15/2023 11.4 (L)    HCT 06/15/2023 31.5 (L)   Office Visit on 05/02/2023  Component Date Value   WBC 05/02/2023 6.0    RBC 05/02/2023 3.47 (L)    Hemoglobin 05/02/2023 10.9 (L)    HCT 05/02/2023 32.7 (L)    MCV 05/02/2023 94.4    MCHC 05/02/2023 33.4    RDW 05/02/2023 13.6    Platelets 05/02/2023 370.0  Neutrophils Relative % 05/02/2023 57.7    Lymphocytes Relative 05/02/2023 29.8    Monocytes Relative 05/02/2023 9.6    Eosinophils Relative 05/02/2023 1.9    Basophils Relative 05/02/2023 1.0    Neutro Abs 05/02/2023 3.5    Lymphs Abs 05/02/2023 1.8    Monocytes Absolute 05/02/2023 0.6    Eosinophils Absolute 05/02/2023 0.1    Basophils Absolute 05/02/2023 0.1    Sodium 05/02/2023 136    Potassium 05/02/2023 4.6    Chloride 05/02/2023 98    CO2 05/02/2023 33 (H)    Glucose, Bld 05/02/2023 81    BUN 05/02/2023 18    Creatinine, Ser 05/02/2023 0.64    Total Bilirubin 05/02/2023 0.5    Alkaline Phosphatase 05/02/2023 49    AST 05/02/2023 19    ALT 05/02/2023 16    Total Protein 05/02/2023 7.0    Albumin 05/02/2023 4.6    GFR 05/02/2023 97.44    Calcium  05/02/2023 9.7    Cholesterol 05/02/2023 179    Triglycerides 05/02/2023 93.0    HDL 05/02/2023 74.30    VLDL 05/02/2023 18.6    LDL Cholesterol 05/02/2023 86    Total CHOL/HDL Ratio 05/02/2023 2    NonHDL 05/02/2023 104.33    GGT 05/02/2023 38   Appointment on 02/14/2023  Component Date Value   S' Lateral 02/14/2023 2.93    Area-P 1/2 02/14/2023 3.85    MV M vel 02/14/2023 4.59    MV Peak grad 02/14/2023 84.3    Est EF 02/14/2023 55 - 60%   Office Visit on 12/25/2022  Component Date Value   Uric Acid, Serum 12/25/2022 3.8    Sed Rate 12/25/2022 11    CRP 12/25/2022 <1.0    Sodium 12/25/2022 140    Potassium 12/25/2022 4.6    Chloride 12/25/2022 99    CO2 12/25/2022 34 (H)    Glucose, Bld 12/25/2022 94    BUN 12/25/2022 14    Creatinine, Ser 12/25/2022 0.65    Total  Bilirubin 12/25/2022 0.4    Alkaline Phosphatase 12/25/2022 50    AST 12/25/2022 25    ALT 12/25/2022 28    Total Protein 12/25/2022 7.4    Albumin 12/25/2022 4.8    GFR 12/25/2022 97.31    Calcium  12/25/2022 10.7 (H)    AFP-Tumor Marker 12/25/2022 9.6 (H)    Cortisol, Plasma 12/25/2022 9.4    C206 ACTH  12/25/2022 14   Office Visit on 11/28/2022  Component Date Value   H pylori Ag, Stl 11/30/2022 Negative   Lab on 11/21/2022  Component Date Value   Sodium 11/21/2022 136    Potassium 11/21/2022 4.1    Chloride 11/21/2022 97    CO2 11/21/2022 30    Glucose, Bld 11/21/2022 93    BUN 11/21/2022 15    Creatinine, Ser 11/21/2022 0.72    GFR 11/21/2022 92.47    Calcium  11/21/2022 9.9    TSH 11/21/2022 1.31    Free T4 11/21/2022 0.76   Office Visit on 11/16/2022  Component Date Value   High risk HPV 11/16/2022 Negative    Adequacy 11/16/2022 Satisfactory for evaluation; transformation zone component PRESENT.    Diagnosis 11/16/2022 - Negative for intraepithelial lesion or malignancy (NILM)    Comment 11/16/2022 Atrophic changes are present.    Comment 11/16/2022 Normal Reference Range HPV - Negative    Glucose, UA 11/16/2022 NEGATIVE    Bilirubin Urine 11/16/2022 NEGATIVE    Ketones, ur 11/16/2022 NEGATIVE    Specific Gravity, Urine 11/16/2022 1.010  Hgb urine dipstick 11/16/2022 NEGATIVE    pH 11/16/2022 7.0    Protein, ur 11/16/2022 NEGATIVE    Urobilinogen, UA 11/16/2022 0.2    Nitrite 11/16/2022 NEGATIVE    Leukocytes,Ua 11/16/2022 NEGATIVE   There may be more visits with results that are not included.  No image results found. CT ANGIO GI BLEED Result Date: 06/15/2023 CLINICAL DATA:  Lower abdominal cramping and rectal bleeding. EXAM: CTA ABDOMEN AND PELVIS WITHOUT AND WITH CONTRAST TECHNIQUE: Multidetector CT imaging of the abdomen and pelvis was performed using the standard protocol during bolus administration of intravenous contrast. Multiplanar reconstructed images  and MIPs were obtained and reviewed to evaluate the vascular anatomy. RADIATION DOSE REDUCTION: This exam was performed according to the departmental dose-optimization program which includes automated exposure control, adjustment of the mA and/or kV according to patient size and/or use of iterative reconstruction technique. CONTRAST:  OMNIPAQUE  IOHEXOL  350 MG/ML SOLN COMPARISON:  10/12/2022 FINDINGS: VASCULAR Aorta: Atheromatous wall thickening.  No dissection or aneurysm Celiac: Small separate origin splenic and left gastric arteries. The hepatic artery is replaced to the SMA. Mild atheromatous calcification. SMA: No stenosis, beading, or major branch occlusion is seen. Renals: Unremarkable IMA: Patent with some hyperenhancement of left-sided colic arteries without discrete vascular lesion. No occlusion or beading. Inflow: Minimal atheromatous change without stenosis, dissection, or aneurysm Proximal Outflow: Limited atheromatous plaque at the right common femoral artery. No stenosis or aneurysm. Veins: No veno occlusive disease or stenosis detected. Review of the MIP images confirms the above findings. NON-VASCULAR Lower chest: No acute finding. Trace pericardial fluid and mild dependent atelectasis. Hepatobiliary: Multiple up attic cysts as previously characterized. Intermediate density in the left superior cyst which measures 2.2 cm, previously demonstrated to be proteinaceous/hemorrhagic head MRI. No superimposed worrisome finding.No evidence of biliary obstruction or stone. Pancreas: Unremarkable. Spleen: Unremarkable. Adrenals/Urinary Tract: Negative adrenals. No hydronephrosis or stone. Unremarkable bladder. Stomach/Bowel: Intense submucosal low-density thickening and edema affecting the descending colon with mild distal transverse involvement as well. The pericolonic fat is reticulated. No skip lesion or perforation. Lymphatic: No mass or adenopathy. Reproductive:No pathologic findings. Other: No  ascites or pneumoperitoneum. Musculoskeletal: No acute abnormalities. IMPRESSION: 1. Prominent descending colitis. No underlying arterial or venous occlusive disease. 2. Atherosclerosis without significant stenosis of major arteries. Electronically Signed   By: Dorn Roulette M.D.   On: 06/15/2023 06:26   MM 3D SCREENING MAMMOGRAM BILATERAL BREAST Result Date: 05/15/2023 CLINICAL DATA:  Screening. EXAM: DIGITAL SCREENING BILATERAL MAMMOGRAM WITH TOMOSYNTHESIS AND CAD TECHNIQUE: Bilateral screening digital craniocaudal and mediolateral oblique mammograms were obtained. Bilateral screening digital breast tomosynthesis was performed. The images were evaluated with computer-aided detection. COMPARISON:  Previous exam(s). ACR Breast Density Category c: The breasts are heterogeneously dense, which may obscure small masses. FINDINGS: There are no findings suspicious for malignancy. IMPRESSION: No mammographic evidence of malignancy. A result letter of this screening mammogram will be mailed directly to the patient. RECOMMENDATION: Screening mammogram in one year. (Code:SM-B-01Y) BI-RADS CATEGORY  1: Negative. Electronically Signed   By: Rosaline Collet M.D.   On: 05/15/2023 09:27   DG Foot 2 Views Right Result Date: 05/09/2023 Please see detailed radiograph report in office note. CT ANGIO GI BLEED Result Date: 06/15/2023 CLINICAL DATA:  Lower abdominal cramping and rectal bleeding. EXAM: CTA ABDOMEN AND PELVIS WITHOUT AND WITH CONTRAST TECHNIQUE: Multidetector CT imaging of the abdomen and pelvis was performed using the standard protocol during bolus administration of intravenous contrast. Multiplanar reconstructed images and MIPs were obtained and reviewed  to evaluate the vascular anatomy. RADIATION DOSE REDUCTION: This exam was performed according to the departmental dose-optimization program which includes automated exposure control, adjustment of the mA and/or kV according to patient size and/or use of  iterative reconstruction technique. CONTRAST:  OMNIPAQUE  IOHEXOL  350 MG/ML SOLN COMPARISON:  10/12/2022 FINDINGS: VASCULAR Aorta: Atheromatous wall thickening.  No dissection or aneurysm Celiac: Small separate origin splenic and left gastric arteries. The hepatic artery is replaced to the SMA. Mild atheromatous calcification. SMA: No stenosis, beading, or major branch occlusion is seen. Renals: Unremarkable IMA: Patent with some hyperenhancement of left-sided colic arteries without discrete vascular lesion. No occlusion or beading. Inflow: Minimal atheromatous change without stenosis, dissection, or aneurysm Proximal Outflow: Limited atheromatous plaque at the right common femoral artery. No stenosis or aneurysm. Veins: No veno occlusive disease or stenosis detected. Review of the MIP images confirms the above findings. NON-VASCULAR Lower chest: No acute finding. Trace pericardial fluid and mild dependent atelectasis. Hepatobiliary: Multiple up attic cysts as previously characterized. Intermediate density in the left superior cyst which measures 2.2 cm, previously demonstrated to be proteinaceous/hemorrhagic head MRI. No superimposed worrisome finding.No evidence of biliary obstruction or stone. Pancreas: Unremarkable. Spleen: Unremarkable. Adrenals/Urinary Tract: Negative adrenals. No hydronephrosis or stone. Unremarkable bladder. Stomach/Bowel: Intense submucosal low-density thickening and edema affecting the descending colon with mild distal transverse involvement as well. The pericolonic fat is reticulated. No skip lesion or perforation. Lymphatic: No mass or adenopathy. Reproductive:No pathologic findings. Other: No ascites or pneumoperitoneum. Musculoskeletal: No acute abnormalities. IMPRESSION: 1. Prominent descending colitis. No underlying arterial or venous occlusive disease. 2. Atherosclerosis without significant stenosis of major arteries. Electronically Signed   By: Dorn Roulette M.D.   On:  06/15/2023 06:26      Assessment & Plan COVID COVID-19 Infection   Symptoms have significantly improved after Paxlovid , with only a minimal residual cough and no fever. There is concern about potential COVID-19 rebound post-Paxlovid . Family members are symptomatic but tested negative with unreliable home tests. Advised against returning to work due to infection risk, ongoing cough, and potential rebound. Discussed risks of COVID-19 rebound and variability in duration and severity. Perform a COVID-19 test. Avoid returning to work until fully asymptomatic. Provide a note stating a negative COVID-19 test result if applicable. Call if symptoms recur for another round of Paxlovid . COVID-19 virus RNA test result positive at limit of detection      Orders Placed During this Encounter:   Orders Placed This Encounter  Procedures   POC COVID-19    Previously tested for COVID-19:   Yes    Resident in a congregate (group) care setting:   Unknown    Employed in healthcare setting:   Unknown    Pregnant:   No   No orders of the defined types were placed in this encounter.     This document was synthesized by artificial intelligence ([employer]) using HIPAA-compliant recording of the clinical interaction;   We discussed the use of AI scribe software for clinical note transcription with the patient, who gave verbal consent to proceed.    Additional Info: This encounter employed state-of-the-art, real-time, collaborative documentation. The patient actively reviewed and assisted in updating their electronic medical record on a shared screen, ensuring transparency and facilitating joint problem-solving for the problem list, overview, and plan. This approach promotes accurate, informed care. The treatment plan was discussed and reviewed in detail, including medication safety, potential side effects, and all patient questions. We confirmed understanding and comfort with the plan. Follow-up  instructions were  established, including contacting the office for any concerns, returning if symptoms worsen, persist, or new symptoms develop, and precautions for potential emergency department visits.

## 2023-07-11 ENCOUNTER — Other Ambulatory Visit: Payer: Self-pay | Admitting: Internal Medicine

## 2023-07-11 DIAGNOSIS — M5440 Lumbago with sciatica, unspecified side: Secondary | ICD-10-CM

## 2023-07-11 DIAGNOSIS — R5383 Other fatigue: Secondary | ICD-10-CM

## 2023-07-17 ENCOUNTER — Encounter (HOSPITAL_COMMUNITY): Payer: Self-pay

## 2023-07-18 ENCOUNTER — Telehealth (INDEPENDENT_AMBULATORY_CARE_PROVIDER_SITE_OTHER): Payer: Medicaid Other | Admitting: Physician Assistant

## 2023-07-18 ENCOUNTER — Encounter (HOSPITAL_COMMUNITY): Payer: Self-pay | Admitting: Physician Assistant

## 2023-07-18 DIAGNOSIS — Z79899 Other long term (current) drug therapy: Secondary | ICD-10-CM | POA: Diagnosis not present

## 2023-07-18 DIAGNOSIS — F411 Generalized anxiety disorder: Secondary | ICD-10-CM | POA: Diagnosis not present

## 2023-07-18 DIAGNOSIS — F331 Major depressive disorder, recurrent, moderate: Secondary | ICD-10-CM

## 2023-07-18 NOTE — Progress Notes (Signed)
BH MD/PA/NP OP Progress Note  Virtual Visit via Video Note  I connected with Amanda White on 07/18/23 at  3:30 PM EST by a video enabled telemedicine application and verified that I am speaking with the correct person using two identifiers.  Location: Patient: Home Provider: Clinic   I discussed the limitations of evaluation and management by telemedicine and the availability of in person appointments. The patient expressed understanding and agreed to proceed.  Follow Up Instructions:   I discussed the assessment and treatment plan with the patient. The patient was provided an opportunity to ask questions and all were answered. The patient agreed with the plan and demonstrated an understanding of the instructions.   The patient was advised to call back or seek an in-person evaluation if the symptoms worsen or if the condition fails to improve as anticipated.  I provided 15 minutes of non-face-to-face time during this encounter.  Meta Hatchet, PA    07/18/2023 8:22 PM Amanda White  MRN:  [id number]  Chief Complaint:  Chief Complaint  Patient presents with   Follow-up   HPI:   Amanda White is a 58 year old female with a past psychiatric history significant for generalized anxiety disorder and major depressive disorder who presents to [facility] via virtual video visit for follow-up and medication management.  Patient is currently being managed on the following psychiatric medication:  Venlafaxine XR (Effexor XR) 75 mg daily Clonazepam 2 mg at bedtime  Patient reports that she feels more upbeat since taking her venlafaxine.  She states that she has not been as grouchy and feels that the medication is working great.  Patient denies experiencing any adverse side effects from her use of venlafaxine.  Patient denies overt depression but states that her anxiety is still present.  Patient attributes her anxiety to her husband.   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  A GAD-7 screen was performed with the patient scoring a 2.  Patient is alert and oriented x 4, calm, cooperative, and fully engaged in conversation during the encounter.  Patient endorses a very good mood.  Patient exhibits euthymic mood with appropriate affect.  Patient denies suicidal or homicidal ideations.  She further denies auditory or visual hallucinations and does not appear to be responding to internal/external stimuli.  Patient endorses good sleep and receives on average 8 hours of sleep per night.  Patient endorses good appetite and eats on average 2 meals per day.  Patient denies alcohol consumption, tobacco use, or illicit drug use.  Visit Diagnosis:    ICD-10-CM   1. Long-term current use of benzodiazepine  Z79.899 Urine Drug Panel 7    2. Moderate episode of recurrent major depressive disorder (HCC)  F33.1     3. Generalized anxiety disorder  F41.1       Past Psychiatric History:  Patient reports that she has a history of mental health and has been treated for the last 20 years.  She reports that a lot of her mental health extends from her husband.  Patient is diagnosed with generalized anxiety disorder that is being managed by her primary care provider   Patient denies a past history of hospitalization due to mental health   Patient denies a past history of suicide attempts Patient denies a past history of homicide attempts  Past Medical History:  Past Medical History:  Diagnosis Date   Abnormal cardiac CT angiography 10/12/2022  Small intermediate density pericardial effusion versus thickening. Consider further evaluation with echocardiography.   Anxiety    Bipolar 1 disorder (HCC)    Carpal tunnel syndrome 09/25/2022   R sided release 2022, all better now.   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   Depression    Depression    Dizziness 10/09/2022   Saw emergency room 10/03/22   GERD (gastroesophageal reflux disease)    History of weight loss 12/25/2022   Hypertension    LFT elevation 12/25/2022   Lab Results      Component    Value    Date/Time           ALT    28    12/25/2022 10:31 AM           ALT    99 (H)    09/25/2022 09:53 AM           ALT    22    06/22/2020 01:26 PM           ALT    28    05/19/2018 09:51 PM           ALT    13    01/03/2012 09:55 AM             Lab Results      Component    Value    Date/Time           AST    25    12/25/2022 10:31 AM           AST    40 (H)    0   Migraine    Pressure ulcer 11/28/2022   Subcutaneous nodule 09/25/2022   Unintentional weight loss 09/25/2022   November 28, 2022 interim history:   Denies any heartburn, prior abdomen/flank pain gone, just some right upper chest pain now, gastrointestinal did endoscopy and found ulcer but we don't have report.  Off advil. Weight stable. Continuing remeron         Wt Readings from Last 5 Encounters:  11/28/22  125 lb 6.4 oz (56.9 kg)  11/24/22  125 lb 6.4 oz (56.9 kg)  11/16/22  125 lb 11.2 oz (57 kg)  10/25/22     Past Surgical History:  Procedure Laterality Date   CARPAL TUNNEL RELEASE Right 04/18/2021   Procedure: RIGHT ENDOSCOPIC CARPAL TUNNEL RELEASE;  Surgeon: Mack Hook, MD;  Location: [facility];  Service: Orthopedics;  Laterality: Right;  LENTH OF SURGERY: 30 MINUTES   DILATION AND CURETTAGE OF UTERUS     FOOT SURGERY  02/27/2023   --- BI PLANAR OSTEOTOMY 1ST MPJ RT, FIXATION    Family Psychiatric History:  Patient reports that her mother's side of the family struggles with mental health.  She reports that her mother's side of the family from [place] and states that many family members saw many of the atrocities during  World War II.   Family history of suicide attempts: Patient denies Family history of homicide attempts: Patient denies Family history of substance abuse: Patient reports that her nephew abused heroin.  She reports that her brother uses marijuana frequently.  She reports that her father was an alcoholic  Family History:  Family  History  Problem Relation Age of Onset   Hypertension Mother    Stroke Father    BRCA 1/2 Neg Hx    Breast cancer Neg Hx     Social History:  Social History   Socioeconomic History   Marital status: Married    Spouse name: Not on file   Number of children: Not on file   Years of education: Not on file   Highest education level: Bachelor's degree (e.g., BA, AB, BS)  Occupational History   Not on file  Tobacco Use   Smoking status: Never   Smokeless tobacco: Never  Substance and Sexual Activity   Alcohol use: No    Comment: occas.   Drug use: No   Sexual activity: Not on file  Other Topics Concern   Not on file  Social History Narrative   Not on file   Social Drivers of Health   Financial Resource Strain: Medium Risk (05/31/2023)   Overall Financial Resource Strain (CARDIA)    Difficulty of Paying Living Expenses: Somewhat hard  Food Insecurity: No Food Insecurity (05/31/2023)   Hunger Vital Sign    Worried About Running Out of Food in the Last Year: Never true    Ran Out of Food in the Last Year: Never true  Transportation Needs: No Transportation Needs (04/30/2023)   PRAPARE - Administrator, Civil Service (Medical): No    Lack of Transportation (Non-Medical): No  Physical Activity: Inactive (05/31/2023)   Exercise Vital Sign    Days of Exercise per Week: 0 days    Minutes of Exercise per Session: 0 min  Stress: Stress Concern Present (05/31/2023)   [employer] of Occupational Health - Occupational Stress Questionnaire    Feeling of Stress : Very much  Social Connections: Socially Isolated (05/31/2023)   Social Connection  and Isolation Panel [NHANES]    Frequency of Communication with Friends and Family: Once a week    Frequency of Social Gatherings with Friends and Family: Never    Attends Religious Services: Never    Database administrator or Organizations: No    Attends Engineer, structural: Never    Marital Status: Married    Allergies: No Known Allergies  Metabolic Disorder Labs: No results found for: "HGBA1C", "MPG" No results found for: "PROLACTIN" Lab Results  Component Value Date   CHOL 179 05/02/2023   TRIG 93.0 05/02/2023   HDL 74.30 05/02/2023   CHOLHDL 2 05/02/2023   VLDL 18.6 05/02/2023   LDLCALC 86 05/02/2023   LDLCALC 124 (H) 09/25/2022   Lab Results  Component Value Date   TSH 1.31 11/21/2022   TSH 0.63 09/25/2022    Therapeutic Level Labs: No results found for: "LITHIUM" No results found for: "VALPROATE" No results found for: "CBMZ"  Current Medications: Current Outpatient Medications  Medication Sig Dispense Refill   acetaminophen (TYLENOL) 325 MG tablet Take 650 mg by mouth every 6 (six) hours as needed.     amoxicillin-clavulanate (AUGMENTIN) 875-125 MG tablet Take 1 tablet by mouth every 12 (twelve) hours. 20 tablet 0   ciclopirox (LOPROX) 0.77 % cream Apply topically 2 (two) times daily. 90 g 2   clonazePAM (KLONOPIN) 2 MG tablet Take 1 tablet (2 mg total) by mouth at bedtime. 30 tablet 1   cyclobenzaprine (FLEXERIL) 10 MG tablet TAKE 1 TABLET BY MOUTH DAILY AS NEEDED FOR MUSCLE SPASMS. 30 tablet 2   diclofenac Sodium (VOLTAREN) 1 % GEL Apply 4 g topically 4 (four)  times daily as needed. 100 g 3   famotidine-calcium carbonate-magnesium hydroxide (PEPCID COMPLETE) 10-800-165 MG chewable tablet Chew 1 tablet by mouth daily as needed. 100 tablet 11   gabapentin (NEURONTIN) 300 MG capsule Take 1 capsule (300 mg total) by mouth 3 (three) times daily. 270 capsule 3   lisinopril-hydrochlorothiazide (ZESTORETIC) 10-12.5 MG tablet TAKE 1 TABLET BY MOUTH EVERY DAY 90  tablet 1   omeprazole (PRILOSEC) 40 MG capsule Take 40 mg by mouth every morning.     ondansetron (ZOFRAN ODT) 4 MG disintegrating tablet Take 1 tablet (4 mg total) by mouth every 8 (eight) hours as needed for nausea or vomiting. 20 tablet 0   ondansetron (ZOFRAN) 4 MG tablet Take 1 tablet (4 mg total) by mouth every 8 (eight) hours as needed for nausea or vomiting. 20 tablet 0   oxyCODONE-acetaminophen (PERCOCET/ROXICET) 5-325 MG tablet Take 1 tablet by mouth every 6 (six) hours as needed for severe pain (pain score 7-10). 10 tablet 0   promethazine-dextromethorphan (PROMETHAZINE-DM) 6.25-15 MG/5ML syrup Take 5 mLs by mouth 4 (four) times daily as needed for cough. 118 mL 0   rosuvastatin (CRESTOR) 10 MG tablet TAKE 1 TABLET (10 MG TOTAL) BY MOUTH DAILY FOR 30 DAYS, THEN 2 TABLETS (20 MG TOTAL) DAILY. 180 tablet 1   venlafaxine XR (EFFEXOR-XR) 75 MG 24 hr capsule TAKE 1 CAPSULE BY MOUTH DAILY WITH BREAKFAST. 90 capsule 1   No current facility-administered medications for this visit.     Musculoskeletal: Strength & Muscle Tone: within normal limits Gait & Station: normal Patient leans: N/A  Psychiatric Specialty Exam: Review of Systems  Psychiatric/Behavioral:  Negative for decreased concentration, dysphoric mood, hallucinations, self-injury, sleep disturbance and suicidal ideas. The patient is nervous/anxious. The patient is not hyperactive.     Last menstrual period 12/20/2011.There is no height or weight on file to calculate BMI.  General Appearance: Casual  Eye Contact:  Good  Speech:  Clear and Coherent and Normal Rate  Volume:  Normal  Mood:  Anxious  Affect:  Appropriate  Thought Process:  Coherent, Goal Directed, and Descriptions of Associations: Intact  Orientation:  Full (Time, Place, and Person)  Thought Content: WDL   Suicidal Thoughts:  No  Homicidal Thoughts:  No  Memory:  Immediate;   Good Recent;   Good Remote;   Good  Judgement:  Good  Insight:  Good   Psychomotor Activity:  Normal  Concentration:  Concentration: Good and Attention Span: Good  Recall:  Good  Fund of Knowledge: Good  Language: Good  Akathisia:  No  Handed:  Right  AIMS (if indicated): not done  Assets:  Communication Skills Desire for Improvement Housing Social Support Transportation Vocational/Educational  ADL's:  Intact  Cognition: WNL  Sleep:  Good   Screenings: GAD-7    Flowsheet Row Video Visit from 07/18/2023 in [facility] Clinical Support from 06/06/2023 in [facility] Counselor from 05/31/2023 in [facility] Clinical Support from 05/22/2023 in [facility] Office Visit from 05/02/2023 in [facility]  Total GAD-7 Score 2 12 14 21 20       PHQ2-9    Flowsheet Row Video Visit from 07/18/2023 in [facility] Clinical Support from 06/06/2023 in [facility] Counselor from 05/31/2023 in [facility] Clinical Support from 05/22/2023 in [facility] Office Visit from 05/02/2023 in [facility] Total Score 0 3 2 4 4   PHQ-9 Total Score -- 8 7 15 15       Flowsheet Row Video Visit from 07/18/2023 in [facility] from 06/14/2023 in [facility] Emergency Department at [facility] Clinical Support from 06/06/2023 in [facility]  C-SSRS RISK CATEGORY No Risk No Risk No Risk        Assessment and Plan:   Amanda White is a 58 year old female with a past psychiatric history significant for generalized anxiety disorder and major depressive disorder who presents to [facility] via virtual video visit for follow-up and medication management.  Patient  presents to the encounter stating that her use of venlafaxine XR has been helpful in managing her depression.  Patient denies experiencing any adverse side effects from her use of venlafaxine XR.  Patient denies overt depressive symptoms and states that her anxiety has been more manageable since starting the medication.  Despite her anxiety being more manageable, patient would like to continue taking her clonazepam at bedtime.  Patient endorses stability on her current medication regimen and will continue to take her medications as prescribed.  Due to her use of clonazepam, provider informed patient that a urine drug screen would need to be performed to make sure that patient is taking her medication appropriately.  Patient vocalized understanding.  Collaboration of Care: Collaboration of Care: Medication Management AEB provider managing patient's psychiatric medications, Primary Care Provider AEB patient being seen by her primary care provider, Psychiatrist AEB patient being seen by mental health provider at this facility, and Other provider involved in patient's care AEB patient being seen by neurology, cardiology, and OB/GYN  Patient/Guardian was advised Release of Information must be obtained prior to any record release in order to collaborate their care with an outside provider. Patient/Guardian was advised if they have not already done so to contact the registration department to sign all necessary forms in order for [place] to release information regarding their care.   Consent: Patient/Guardian gives verbal consent for treatment and assignment of benefits for services provided during this visit. Patient/Guardian expressed understanding and agreed to proceed.   1. Moderate episode of recurrent major depressive disorder (HCC) Patient to continue taking venlafaxine XR (Effexor XR) 75 mg daily for the management of her major depressive disorder  2. Generalized anxiety disorder Patient to continue taking  venlafaxine XR (Effexor XR) 75 mg daily for the management of her generalized anxiety disorder  3. Long-term current use of benzodiazepine (Primary)  - Urine Drug Panel 7  Patient to follow-up in 2 months Provider spent a total of 15 minutes with the patient/reviewing patient's chart  Meta Hatchet, PA 07/18/2023, 8:22 PM

## 2023-07-24 ENCOUNTER — Ambulatory Visit (INDEPENDENT_AMBULATORY_CARE_PROVIDER_SITE_OTHER): Payer: Medicaid Other | Admitting: Licensed Clinical Social Worker

## 2023-07-24 DIAGNOSIS — F411 Generalized anxiety disorder: Secondary | ICD-10-CM | POA: Diagnosis not present

## 2023-07-24 DIAGNOSIS — F331 Major depressive disorder, recurrent, moderate: Secondary | ICD-10-CM | POA: Diagnosis not present

## 2023-07-24 NOTE — Progress Notes (Unsigned)
THERAPIST PROGRESS NOTE  Virtual Visit via Video Note  I connected with Amanda White on 07/24/23 at  4:00 PM EST by a video enabled telemedicine application and verified that I am speaking with the correct person using two identifiers.  Location: Patient: [facility]  Provider: Providers    I discussed the limitations of evaluation and management by telemedicine and the availability of in person appointments. The patient expressed understanding and agreed to proceed.     I discussed the assessment and treatment plan with the patient. The patient was provided an opportunity to ask questions and all were answered. The patient agreed with the plan and demonstrated an understanding of the instructions.   The patient was advised to call back or seek an in-person evaluation if the symptoms worsen or if the condition fails to improve as anticipated.  I provided 45 minutes of non-face-to-face time during this encounter.   Weber Cooks, LCSW   Participation Level: Active  Behavioral Response: CasualAlertAnxious and Depressed  Type of Therapy: Individual Therapy  Treatment Goals addressed:  Active     Anxiety     LTG: Marilyne will score less than 5 on the Generalized Anxiety Disorder 7 Scale (GAD-7)      Start:  05/31/23    Expected End:  10/26/23         identify three triggers for anxiety  (Progressing)     Start:  05/31/23    Expected End:  10/26/23       Goal Note     Spouse  Work          identify 3 coping skills for anxiety      Start:  05/31/23    Expected End:  10/26/23         Work with Estalee to track symptoms, triggers, and/or skill use through a mood chart, diary card, or journal     Start:  05/31/23         Discuss risks and benefits of medication treatment options for this problem and prescribe as indicated     Start:  05/31/23         Encourage Jakaiya to take psychotropic medication(s) as prescribed     Start:  05/31/23        Intervention Note     Reviewed with patient during session.          Review results of GAD-7 with Evelena to track progress     Start:  05/31/23       Intervention Note     Reviewed with patient during session.            OP Depression     LTG: Reduce frequency, intensity, and duration of depression symptoms so that daily functioning is improved (Progressing)     Start:  05/31/23    Expected End:  10/26/23         LTG: Increase coping skills to manage depression and improve ability to perform daily activities (Progressing)     Start:  05/31/23    Expected End:  10/26/23         LTG: Kenleigh will score less than 10 on the Patient Health Questionnaire (PHQ-9)      Start:  05/31/23    Expected End:  10/26/23         identify 3 triggers for depression  (Progressing)     Start:  05/31/23    Expected End:  10/26/23  Goal Note     Spouse          identify 3 coping skills depression  (Progressing)     Start:  05/31/23    Expected End:  10/26/23         Work with Beatrix to track symptoms, triggers, and/or skill use through a mood chart, diary card, or journal     Start:  05/31/23         Encourage Chace to participate in recovery peer support activities weekly      Start:  05/31/23         Therapist will administer the PHQ-9 at weekly intervals for the next 8 weeks     Start:  05/31/23         Provide Damita educational information and reading material on dissociation, its causes, and symptoms     Start:  05/31/23         Work with Willard to identify the major components of a recent episode of depression: physical symptoms, major thoughts and images, and major behaviors they experienced     Start:  05/31/23         Fiona will identify 3 personal goals for managing depression symptoms to work on during the current treatment episode     Start:  05/31/23            ProgressTowards Goals: Progressing  Interventions: CBT, Motivational  Interviewing, and Supportive  Summary: Amanda White is a 58 y.o. female who presents with depressed and anxious mood\affect.  Patient was pleasant, cooperative, and maintained good eye contact.  She engaged well in therapy session was dressed casually.  Patient comes in today with primary stressors as relationship.  Patient reports that she has been struggling with her current spouse.  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  She reports that she has been struggling with things with even when she turns off her location on her phone he will start a flight about it.  She reports that this has been going on for multiple years.  Patient reports that along with working on her mental health she hopes that her spouse will work on his mental health.  Other stressors for patient and family conflict with one of her sons.  She reports that both her sons currently live with her.  But one of her sons has a drinking problem.  She reports that he will start drinking in the morning.  He will not indicate that he has a problem.  Patient reports that she has had a loss for what she can do for him.  Suicidal/Homicidal: Nowithout intent/plan  Therapist Response:    Intervention/Plan: LCSW educated patient on the right to self-determination for her son and spouse.  LCSW educated patient on the stages of change for precontemplation, contemplation, maintenance and action.  LCSW educated patient on prioritizing herself and becoming her best self.  LCSW supportive therapy for praise and encouragement.  LCSW utilized person centered therapy for empowerment.  LCSW utilized psycho analytic therapy for patient to express thoughts, feelings and emotions and nonjudgmental environment.  Plan: Return again in 4 weeks.  Diagnosis: Moderate episode of recurrent major depressive disorder (HCC)  Generalized anxiety disorder  Collaboration of Care: Other None  today   Patient/Guardian was advised Release of Information must be obtained prior to any record release in order to collaborate their care with an outside provider. Patient/Guardian  was advised if they have not already done so to contact the registration department to sign all necessary forms in order for [place] to release information regarding their care.   Consent: Patient/Guardian gives verbal consent for treatment and assignment of benefits for services provided during this visit. Patient/Guardian expressed understanding and agreed to proceed.   Weber Cooks, LCSW 07/24/2023

## 2023-07-28 DIAGNOSIS — Z419 Encounter for procedure for purposes other than remedying health state, unspecified: Secondary | ICD-10-CM | POA: Diagnosis not present

## 2023-08-12 ENCOUNTER — Other Ambulatory Visit (HOSPITAL_COMMUNITY): Payer: Self-pay | Admitting: Physician Assistant

## 2023-08-12 ENCOUNTER — Other Ambulatory Visit: Payer: Self-pay | Admitting: Internal Medicine

## 2023-08-12 DIAGNOSIS — R5383 Other fatigue: Secondary | ICD-10-CM

## 2023-08-12 DIAGNOSIS — R110 Nausea: Secondary | ICD-10-CM

## 2023-08-12 DIAGNOSIS — F411 Generalized anxiety disorder: Secondary | ICD-10-CM

## 2023-08-13 DIAGNOSIS — Z79899 Other long term (current) drug therapy: Secondary | ICD-10-CM | POA: Diagnosis not present

## 2023-08-14 LAB — URINE DRUG PANEL 7
Amphetamines, Urine: NEGATIVE ng/mL
Barbiturate Quant, Ur: NEGATIVE ng/mL
Benzodiazepine Quant, Ur: NEGATIVE ng/mL
Cannabinoid Quant, Ur: NEGATIVE ng/mL
Cocaine (Metab.): NEGATIVE ng/mL
Opiate Quant, Ur: NEGATIVE ng/mL
PCP Quant, Ur: NEGATIVE ng/mL

## 2023-08-15 ENCOUNTER — Ambulatory Visit (HOSPITAL_COMMUNITY): Payer: Medicaid Other | Admitting: Licensed Clinical Social Worker

## 2023-08-15 ENCOUNTER — Encounter (HOSPITAL_COMMUNITY): Payer: Self-pay

## 2023-08-25 DIAGNOSIS — Z419 Encounter for procedure for purposes other than remedying health state, unspecified: Secondary | ICD-10-CM | POA: Diagnosis not present

## 2023-09-05 ENCOUNTER — Ambulatory Visit (HOSPITAL_COMMUNITY): Payer: Medicaid Other | Admitting: Licensed Clinical Social Worker

## 2023-09-06 ENCOUNTER — Ambulatory Visit (HOSPITAL_COMMUNITY): Payer: Medicaid Other | Admitting: Licensed Clinical Social Worker

## 2023-09-06 ENCOUNTER — Encounter (HOSPITAL_COMMUNITY): Payer: Self-pay

## 2023-09-12 ENCOUNTER — Encounter (HOSPITAL_COMMUNITY): Payer: Self-pay | Admitting: Physician Assistant

## 2023-09-12 ENCOUNTER — Ambulatory Visit (HOSPITAL_COMMUNITY): Payer: Medicaid Other | Admitting: Physician Assistant

## 2023-09-12 VITALS — BP 124/72 | HR 82 | Temp 98.0°F | Ht 63.0 in | Wt 152.0 lb

## 2023-09-12 DIAGNOSIS — F331 Major depressive disorder, recurrent, moderate: Secondary | ICD-10-CM | POA: Diagnosis not present

## 2023-09-12 DIAGNOSIS — F411 Generalized anxiety disorder: Secondary | ICD-10-CM

## 2023-09-12 DIAGNOSIS — Z79899 Other long term (current) drug therapy: Secondary | ICD-10-CM | POA: Diagnosis not present

## 2023-09-12 MED ORDER — VENLAFAXINE HCL ER 75 MG PO CP24
75.0000 mg | ORAL_CAPSULE | Freq: Every day | ORAL | 1 refills | Status: DC
Start: 2023-09-12 — End: 2023-11-02

## 2023-09-12 NOTE — Progress Notes (Signed)
 BH MD/PA/NP OP Progress Note  09/12/2023 8:54 PM Amanda White  MRN:  [id number]  Chief Complaint:  Chief Complaint  Patient presents with   Follow-up   Medication Refill   HPI:   Stephania M. Hartel is a 58 year old female with a past psychiatric history significant for generalized anxiety disorder and major depressive disorder who presents to [facility] for follow-up and medication management.  Patient is currently being managed on the following psychiatric medication:  Venlafaxine XR (Effexor XR) 75 mg daily Clonazepam 2 mg at bedtime  During the assessment, provider discussed patient's urine drug screen results.  Provider informed patient that her urine screen came up negative for any illicit substances.  Provider explained to patient that if she has been taking her clonazepam regularly, then her urine drug screen should have been positive for benzodiazepine.  Patient vocalized understanding and informed provider that she is taking her clonazepam regularly at bedtime.  Provider instructed patient to have another urine drug screen performed to determine if she is taking her clonazepam accurately.  Patient reports that she has been down and out lately.  She also reports that she has been sleeping a lot more lately.  She reports that she is not working, then occasionally she naps during the day.  Despite feeling more tired, patient reports no issues with her current medication regimen.  Patient continues to endorse depression and rates her depression as 5-6 out of 10 with 10 being most severe.  Patient endorses depressive episodes 3 days out of the week.  Patient endorses the following depressive symptoms: feelings of sadness, lack of motivation, decreased concentration, irritability, decreased energy, and hopelessness.  Patient denies feelings of guilt/worthlessness.  Patient endorses some anxiety stating that it comes and goes.  She reports that  her anxiety is at its worst when she gets mad.  She reports that her anxiety is elevated due to it being her mother's death anniversary.  A PHQ-9 screen was performed with the patient scoring at 13.  A GAD-7 screen was also performed with the patient scoring a 9.  Patient is alert and oriented x 4, calm, cooperative, and fully engaged in conversation during the encounter.  Despite being tired, patient endorses okay mood.  Patient exhibits depressed mood with appropriate affect.  Patient denies suicidal or homicidal ideations.  Patient further denies auditory or visual hallucinations and does not appear to be responding to internal/external stimuli.  Patient endorses increased sleep and receives on average 8 to 9 hours of sleep per night.  Patient endorses good appetite.  Patient denies any alcohol consumption, tobacco use, or illicit drug use.  Visit Diagnosis:    ICD-10-CM   1. Long-term current use of benzodiazepine  Z79.899 Drug Screen, Urine    2. Moderate episode of recurrent major depressive disorder (HCC)  F33.1 venlafaxine XR (EFFEXOR-XR) 75 MG 24 hr capsule    3. Generalized anxiety disorder  F41.1 venlafaxine XR (EFFEXOR-XR) 75 MG 24 hr capsule      Past Psychiatric History:  Patient reports that she has a history of mental health and has been treated for the last 20 years.  She reports that a lot of her mental health extends from her husband.  Patient is diagnosed with generalized anxiety disorder that is being managed by her primary care provider   Patient denies a past history of hospitalization due to mental health   Patient denies a past history of suicide attempts Patient denies a past history  of homicide attempts  Past Medical History:  Past Medical History:  Diagnosis Date   Abnormal cardiac CT angiography 10/12/2022   Small intermediate density pericardial effusion versus thickening. Consider further evaluation with echocardiography.   Anxiety    Bipolar 1 disorder  (HCC)    Carpal tunnel syndrome 09/25/2022   R sided release 2022, all better now.   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   Depression    Depression    Dizziness 10/09/2022   Saw emergency room 10/03/22   GERD (gastroesophageal reflux disease)    History of weight loss 12/25/2022   Hypertension    LFT elevation 12/25/2022   Lab Results      Component    Value    Date/Time           ALT    28    12/25/2022 10:31 AM           ALT    99 (H)    09/25/2022 09:53 AM           ALT    22    06/22/2020 01:26 PM           ALT    28    05/19/2018 09:51 PM           ALT    13    01/03/2012 09:55 AM             Lab Results      Component    Value    Date/Time           AST    25    12/25/2022 10:31 AM           AST    40 (H)    0   Migraine    Pressure ulcer 11/28/2022   Subcutaneous nodule 09/25/2022   Unintentional weight loss 09/25/2022   November 28, 2022 interim history:   Denies any heartburn, prior abdomen/flank pain gone, just some right upper chest pain now, gastrointestinal did endoscopy and found ulcer but we don't have report.  Off advil. Weight stable. Continuing remeron         Wt Readings from Last 5 Encounters:  11/28/22  125 lb 6.4 oz (56.9 kg)  11/24/22  125 lb 6.4 oz (56.9 kg)  11/16/22  125 lb 11.2 oz (57 kg)  10/25/22     Past Surgical History:  Procedure Laterality Date   CARPAL TUNNEL RELEASE Right 04/18/2021   Procedure: RIGHT ENDOSCOPIC CARPAL TUNNEL RELEASE;  Surgeon: Mack Hook, MD;  Location: [facility];  Service: Orthopedics;  Laterality: Right;  LENTH OF SURGERY: 30 MINUTES   DILATION AND CURETTAGE OF UTERUS     FOOT SURGERY  02/27/2023   --- BI PLANAR OSTEOTOMY 1ST MPJ RT, FIXATION    Family Psychiatric History:  Patient  reports that her mother's side of the family struggles with mental health.  She reports that her mother's side of the family from [place] and states that many family members saw many of the atrocities during World War II.   Family history of suicide attempts: Patient denies Family history of homicide attempts: Patient denies Family history of substance abuse: Patient reports that her nephew abused heroin.  She reports that her brother uses marijuana frequently.  She reports that her father was an alcoholic  Family History:  Family History  Problem Relation Age of Onset   Hypertension Mother    Stroke Father    BRCA 1/2 Neg Hx    Breast cancer Neg Hx     Social History:  Social History   Socioeconomic History   Marital status: Married    Spouse name: Not on file   Number of children: Not on file   Years of education: Not on file   Highest education level: Bachelor's degree (e.g., BA, AB, BS)  Occupational History   Not on file  Tobacco Use   Smoking status: Never   Smokeless tobacco: Never  Substance and Sexual Activity   Alcohol use: No    Comment: occas.   Drug use: No   Sexual activity: Not on file  Other Topics Concern   Not on file  Social History Narrative   Not on file   Social Drivers of Health   Financial Resource Strain: Medium Risk (05/31/2023)   Overall Financial Resource Strain (CARDIA)    Difficulty of Paying Living Expenses: Somewhat hard  Food Insecurity: No Food Insecurity (05/31/2023)   Hunger Vital Sign    Worried About Running Out of Food in the Last Year: Never true    Ran Out of Food in the Last Year: Never true  Transportation Needs: No Transportation Needs (04/30/2023)   PRAPARE - Administrator, Civil Service (Medical): No    Lack of Transportation (Non-Medical): No  Physical Activity: Inactive (05/31/2023)   Exercise Vital Sign    Days of Exercise per Week: 0 days    Minutes of Exercise per Session: 0 min  Stress: Stress Concern  Present (05/31/2023)   [employer] of Occupational Health - Occupational Stress Questionnaire    Feeling of Stress : Very much  Social Connections: Socially Isolated (05/31/2023)   Social Connection and Isolation Panel [NHANES]    Frequency of Communication with Friends and Family: Once a week    Frequency of Social Gatherings with Friends and Family: Never    Attends Religious Services: Never    Database administrator or Organizations: No    Attends Engineer, structural: Never    Marital Status: Married    Allergies: No Known Allergies  Metabolic Disorder Labs: No results found for: "HGBA1C", "MPG" No results found for: "PROLACTIN" Lab Results  Component Value Date   CHOL 179 05/02/2023   TRIG 93.0 05/02/2023   HDL 74.30 05/02/2023   CHOLHDL 2 05/02/2023   VLDL 18.6 05/02/2023   LDLCALC 86 05/02/2023   LDLCALC 124 (H) 09/25/2022   Lab Results  Component Value Date   TSH 1.31 11/21/2022   TSH 0.63 09/25/2022    Therapeutic Level Labs: No results found for: "LITHIUM" No results found for: "VALPROATE" No results found for: "CBMZ"  Current Medications: Current Outpatient Medications  Medication Sig Dispense Refill   acetaminophen (TYLENOL) 325 MG tablet Take 650 mg by mouth every 6 (six) hours as needed.     amoxicillin-clavulanate (AUGMENTIN) 875-125 MG tablet Take 1 tablet by mouth every 12 (twelve) hours. 20 tablet 0   ciclopirox (LOPROX) 0.77 % cream Apply topically 2 (two) times daily. 90 g 2   clonazePAM (KLONOPIN) 2 MG tablet TAKE 1 TABLET BY MOUTH AT BEDTIME. 30 tablet 1   cyclobenzaprine (FLEXERIL) 10 MG tablet TAKE 1 TABLET BY MOUTH DAILY AS NEEDED FOR  MUSCLE SPASMS. 30 tablet 2   diclofenac Sodium (VOLTAREN) 1 % GEL Apply 4 g topically 4 (four) times daily as needed. 100 g 3   famotidine-calcium carbonate-magnesium hydroxide (PEPCID COMPLETE) 10-800-165 MG chewable tablet Chew 1 tablet by mouth daily as needed. 100 tablet 11   gabapentin  (NEURONTIN) 300 MG capsule Take 1 capsule (300 mg total) by mouth 3 (three) times daily. 270 capsule 3   lisinopril-hydrochlorothiazide (ZESTORETIC) 10-12.5 MG tablet TAKE 1 TABLET BY MOUTH EVERY DAY 90 tablet 1   omeprazole (PRILOSEC) 40 MG capsule Take 40 mg by mouth every morning.     ondansetron (ZOFRAN) 4 MG tablet Take 1 tablet (4 mg total) by mouth every 8 (eight) hours as needed for nausea or vomiting. 20 tablet 0   ondansetron (ZOFRAN-ODT) 4 MG disintegrating tablet TAKE 1 TABLET BY MOUTH EVERY 8 HOURS AS NEEDED FOR NAUSEA AND VOMITING 9 tablet 2   oxyCODONE-acetaminophen (PERCOCET/ROXICET) 5-325 MG tablet Take 1 tablet by mouth every 6 (six) hours as needed for severe pain (pain score 7-10). 10 tablet 0   promethazine-dextromethorphan (PROMETHAZINE-DM) 6.25-15 MG/5ML syrup Take 5 mLs by mouth 4 (four) times daily as needed for cough. 118 mL 0   rosuvastatin (CRESTOR) 10 MG tablet TAKE 1 TABLET (10 MG TOTAL) BY MOUTH DAILY FOR 30 DAYS, THEN 2 TABLETS (20 MG TOTAL) DAILY. 180 tablet 1   venlafaxine XR (EFFEXOR-XR) 75 MG 24 hr capsule Take 1 capsule (75 mg total) by mouth daily with breakfast. 90 capsule 1   No current facility-administered medications for this visit.     Musculoskeletal: Strength & Muscle Tone: within normal limits Gait & Station: normal Patient leans: N/A  Psychiatric Specialty Exam: Review of Systems  Psychiatric/Behavioral:  Positive for dysphoric mood and sleep disturbance. Negative for decreased concentration, hallucinations, self-injury and suicidal ideas. The patient is nervous/anxious. The patient is not hyperactive.     Blood pressure 124/72, pulse 82, temperature 98 F (36.7 C), temperature source Oral, height 5\' 3"  (1.6 m), weight 152 lb (68.9 kg), last menstrual period 12/20/2011, SpO2 98%.Body mass index is 26.93 kg/m.  General Appearance: Casual  Eye Contact:  Good  Speech:  Clear and Coherent and Normal Rate  Volume:  Normal  Mood:  Anxious and  Depressed  Affect:  Appropriate  Thought Process:  Coherent, Goal Directed, and Descriptions of Associations: Intact  Orientation:  Full (Time, Place, and Person)  Thought Content: WDL   Suicidal Thoughts:  No  Homicidal Thoughts:  No  Memory:  Immediate;   Good Recent;   Good Remote;   Good  Judgement:  Good  Insight:  Good  Psychomotor Activity:  Normal  Concentration:  Concentration: Good and Attention Span: Good  Recall:  Good  Fund of Knowledge: Good  Language: Good  Akathisia:  No  Handed:  Right  AIMS (if indicated): not done  Assets:  Communication Skills Desire for Improvement Housing Social Support Transportation Vocational/Educational  ADL's:  Intact  Cognition: WNL  Sleep:  Fair; patient endorses hypersomnia   Screenings: GAD-7    Flowsheet Row Clinical Support from 09/12/2023 in [facility] Video Visit from 07/18/2023 in [facility] Clinical Support from 06/06/2023 in [facility] Counselor from 05/31/2023 in [facility] Clinical Support from 05/22/2023 in [facility]  Total GAD-7 Score 12 2 12 14 21       PHQ2-9    Flowsheet Row Clinical Support from  09/12/2023 in [facility] Video Visit from 07/18/2023 in [facility] Clinical Support from 06/06/2023 in [facility] Counselor from 05/31/2023 in [facility] Clinical Support from 05/22/2023 in [facility]  PHQ-2 Total Score 3 0 3 2 4   PHQ-9 Total Score 11 -- 8 7 15       Flowsheet Row Clinical Support from 09/12/2023 in [facility] Video Visit from 07/18/2023 in [facility] ED from 06/14/2023 in [facility] at [facility]  C-SSRS RISK CATEGORY No  Risk No Risk No Risk        Assessment and Plan:   Amanda White is a 58 year old female with a past psychiatric history significant for generalized anxiety disorder and major depressive disorder who presents to [facility] for follow-up and medication management.   Provider went over patient's urine drug screen results.  Provider informed patient that her urine drug results came up negative for any illicit substances.  Patient was informed that if she is taking her benzodiazepine regularly, then her results should have been positive for benzodiazepine.  Patient informed provider that she is currently taking her clonazepam regularly (every night) and believes that she may have been a few days without the medication prior to providing a urine sample.  Provider to obtain another urine drug screen from the patient to make sure patient is taking her medication accurately.  Patient reports that she has been more drowsy lately and has been sleeping more.  She reports that she is not working, that she may occasionally take a nap during the day.  She continues to endorse some depression and anxiety attributed to stressors in her life.  Despite her depression and anxiety, patient would like to continue taking her medications regularly.  Patient's medications to be prescribed through pharmacy of choice.  Collaboration of Care: Collaboration of Care: Medication Management AEB provider managing patient's psychiatric medications, Primary Care Provider AEB patient being seen by her primary care provider, Psychiatrist AEB patient being seen by mental health provider at this facility, and Other provider involved in patient's care AEB patient being seen by neurology, cardiology, and OB/GYN  Patient/Guardian was advised Release of Information must be obtained prior to any record release in order to collaborate their care with an outside provider. Patient/Guardian was  advised if they have not already done so to contact the registration department to sign all necessary forms in order for [place] to release information regarding their care.   Consent: Patient/Guardian gives verbal consent for treatment and assignment of benefits for services provided during this visit. Patient/Guardian expressed understanding and agreed to proceed.   1. Moderate episode of recurrent major depressive disorder (HCC)  - venlafaxine XR (EFFEXOR-XR) 75 MG 24 hr capsule; Take 1 capsule (75 mg total) by mouth daily with breakfast.  Dispense: 90 capsule; Refill: 1  2. Generalized anxiety disorder Patient to continue taking clonazepam as prescribed.  Patient's clonazepam was last prescribed on 08/14/2023.  PDMP was reviewed prior to the conclusion of the encounter.  - venlafaxine XR (EFFEXOR-XR) 75 MG 24 hr capsule; Take 1 capsule (75 mg total) by mouth daily with breakfast.  Dispense: 90 capsule; Refill: 1  3. Long-term current use of benzodiazepine (Primary)  - Drug Screen, Urine; Future  Patient to follow-up in 2 months Provider spent a total of 19 minutes with the patient/reviewing patient's chart  Meta Hatchet, PA 09/12/2023, 8:54 PM

## 2023-10-06 DIAGNOSIS — Z419 Encounter for procedure for purposes other than remedying health state, unspecified: Secondary | ICD-10-CM | POA: Diagnosis not present

## 2023-10-17 ENCOUNTER — Other Ambulatory Visit: Payer: Self-pay

## 2023-10-17 ENCOUNTER — Emergency Department (HOSPITAL_BASED_OUTPATIENT_CLINIC_OR_DEPARTMENT_OTHER)
Admission: EM | Admit: 2023-10-17 | Discharge: 2023-10-17 | Disposition: A | Discharge status: Home / Self Care | Attending: Emergency Medicine | Admitting: Emergency Medicine

## 2023-10-17 ENCOUNTER — Emergency Department (HOSPITAL_BASED_OUTPATIENT_CLINIC_OR_DEPARTMENT_OTHER)

## 2023-10-17 ENCOUNTER — Encounter: Payer: Self-pay | Admitting: Physician Assistant

## 2023-10-17 ENCOUNTER — Ambulatory Visit (INDEPENDENT_AMBULATORY_CARE_PROVIDER_SITE_OTHER): Admitting: Physician Assistant

## 2023-10-17 VITALS — BP 140/80 | HR 98 | Temp 98.6°F | Ht 63.0 in | Wt 146.4 lb

## 2023-10-17 DIAGNOSIS — R110 Nausea: Secondary | ICD-10-CM

## 2023-10-17 DIAGNOSIS — Z79899 Other long term (current) drug therapy: Secondary | ICD-10-CM | POA: Insufficient documentation

## 2023-10-17 DIAGNOSIS — K769 Liver disease, unspecified: Secondary | ICD-10-CM | POA: Diagnosis not present

## 2023-10-17 DIAGNOSIS — R5383 Other fatigue: Secondary | ICD-10-CM

## 2023-10-17 DIAGNOSIS — R1013 Epigastric pain: Secondary | ICD-10-CM | POA: Insufficient documentation

## 2023-10-17 DIAGNOSIS — I10 Essential (primary) hypertension: Secondary | ICD-10-CM | POA: Diagnosis not present

## 2023-10-17 DIAGNOSIS — R1084 Generalized abdominal pain: Secondary | ICD-10-CM | POA: Diagnosis not present

## 2023-10-17 DIAGNOSIS — R1011 Right upper quadrant pain: Secondary | ICD-10-CM | POA: Insufficient documentation

## 2023-10-17 DIAGNOSIS — K7689 Other specified diseases of liver: Secondary | ICD-10-CM | POA: Diagnosis not present

## 2023-10-17 LAB — COMPREHENSIVE METABOLIC PANEL WITH GFR
ALT: 35 U/L (ref 0–44)
AST: 32 U/L (ref 15–41)
Albumin: 5.2 g/dL — ABNORMAL HIGH (ref 3.5–5.0)
Alkaline Phosphatase: 63 U/L (ref 38–126)
Anion gap: 13 (ref 5–15)
BUN: 12 mg/dL (ref 6–20)
CO2: 28 mmol/L (ref 22–32)
Calcium: 10.1 mg/dL (ref 8.9–10.3)
Chloride: 100 mmol/L (ref 98–111)
Creatinine, Ser: 0.66 mg/dL (ref 0.44–1.00)
GFR, Estimated: >60 mL/min (ref >60)
Glucose, Bld: 105 mg/dL — ABNORMAL HIGH (ref 70–99)
Potassium: 3.8 mmol/L (ref 3.5–5.1)
Sodium: 140 mmol/L (ref 135–145)
Total Bilirubin: 0.5 mg/dL (ref 0.0–1.2)
Total Protein: 7.7 g/dL (ref 6.5–8.1)

## 2023-10-17 LAB — URINALYSIS, ROUTINE W REFLEX MICROSCOPIC
Bacteria, UA: NONE SEEN
Bilirubin Urine: NEGATIVE
Glucose, UA: NEGATIVE mg/dL
Ketones, ur: NEGATIVE mg/dL
Leukocytes,Ua: NEGATIVE
Nitrite: NEGATIVE
Protein, ur: 30 mg/dL — AB
Specific Gravity, Urine: 1.027 (ref 1.005–1.030)
pH: 6.5 (ref 5.0–8.0)

## 2023-10-17 LAB — CBC
HCT: 38.1 % (ref 36.0–46.0)
Hemoglobin: 13.4 g/dL (ref 12.0–15.0)
MCH: 30.5 pg (ref 26.0–34.0)
MCHC: 35.2 g/dL (ref 30.0–36.0)
MCV: 86.6 fL (ref 80.0–100.0)
Platelets: 392 10*3/uL (ref 150–400)
RBC: 4.4 MIL/uL (ref 3.87–5.11)
RDW: 12.5 % (ref 11.5–15.5)
WBC: 9.5 10*3/uL (ref 4.0–10.5)
nRBC: 0 % (ref 0.0–0.2)

## 2023-10-17 LAB — LIPASE, BLOOD: Lipase: 14 U/L (ref 11–51)

## 2023-10-17 MED ORDER — ONDANSETRON 4 MG PO TBDP
4.0000 mg | ORAL_TABLET | Freq: Once | ORAL | Status: AC
  Administered 2023-10-17: 4 mg via ORAL
  Filled 2023-10-17: qty 1

## 2023-10-17 MED ORDER — SUCRALFATE 1 G PO TABS: 1.0000 g | ORAL_TABLET | Freq: Three times a day (TID) | ORAL | 0 refills | Status: AC

## 2023-10-17 MED ORDER — ALUM & MAG HYDROXIDE-SIMETH 200-200-20 MG/5ML PO SUSP
30.0000 mL | Freq: Once | ORAL | Status: AC
  Administered 2023-10-17: 30 mL via ORAL
  Filled 2023-10-17: qty 30

## 2023-10-17 MED ORDER — FAMOTIDINE 20 MG PO TABS
20.0000 mg | ORAL_TABLET | Freq: Once | ORAL | Status: AC
  Administered 2023-10-17: 20 mg via ORAL
  Filled 2023-10-17: qty 1

## 2023-10-17 MED ORDER — ONDANSETRON 4 MG PO TBDP: 4.0000 mg | ORAL_TABLET | Freq: Three times a day (TID) | ORAL | 2 refills | Status: DC | PRN

## 2023-10-17 MED ORDER — ACETAMINOPHEN 325 MG PO TABS
650.0000 mg | ORAL_TABLET | Freq: Once | ORAL | Status: AC
  Administered 2023-10-17: 650 mg via ORAL
  Filled 2023-10-17: qty 2

## 2023-10-17 NOTE — Progress Notes (Signed)
 Amanda White is a 58 y.o. female here for a new problem.  History of Present Illness:   Chief Complaint  Patient presents with   GI issues    Pt c/o epigastric pain off and on past few weeks, vomiting at least once a day.    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  Pt reports vomiting once a day triggered by food/drink, starting two days ago. She notes a hx of peptic ulcer and cystic disease of the liver. Pt states current episode does not feel similar to pain from her previous peptic ulcer. 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 She's been having a lot of fatigue. History of ulcer and colitis. She reports taking Ibuprofen  currently after running out of Tylenol  to relieve her headaches and Pepcid  as needed. She states she takes at most 2 pills of Ibuprofen  at time, but recently took 4 for a headache. She also states she has been taking Zofran  4 mg every 8 hours, Omeprazole 40 mg once a day, and Zestoretic  once a day. Pt reports softer stools. Denies alcohol consumption or hematochezia.   Past Medical History:  Diagnosis Date   Abnormal cardiac CT angiography 10/12/2022   Small intermediate density pericardial effusion versus thickening. Consider further evaluation with echocardiography.   Anxiety    Bipolar 1 disorder (HCC)    Carpal tunnel syndrome 09/25/2022   R sided release 2022, all better now.   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   Depression    Depression    Dizziness 10/09/2022   Saw emergency room 10/03/22   GERD (gastroesophageal reflux  disease)    History of weight loss 12/25/2022   Hypertension    LFT elevation 12/25/2022   Lab Results      Component    Value    Date/Time           ALT    28    12/25/2022 10:31 AM           ALT    99 (H)    09/25/2022 09:53 AM           ALT    22    06/22/2020 01:26 PM           ALT    28    05/19/2018 09:51 PM           ALT    13    01/03/2012 09:55 AM             Lab Results      Component    Value    Date/Time           AST    25    12/25/2022 10:31 AM           AST    40 (H)    0   Migraine    Pressure ulcer 11/28/2022   Subcutaneous nodule 09/25/2022   Unintentional weight loss 09/25/2022   November 28, 2022 interim history:   Denies any heartburn, prior abdomen/flank pain gone, just some right upper chest pain now, gastrointestinal  did endoscopy and found ulcer but we don't have report.  Off advil . Weight stable. Continuing remeron          Wt Readings from Last 5 Encounters:  11/28/22  125 lb 6.4 oz (56.9 kg)  11/24/22  125 lb 6.4 oz (56.9 kg)  11/16/22  125 lb 11.2 oz (57 kg)  10/25/22      Social History   Tobacco Use   Smoking status: Never   Smokeless tobacco: Never  Substance Use Topics   Alcohol use: No    Comment: occas.   Drug use: No    Past Surgical History:  Procedure Laterality Date   CARPAL TUNNEL RELEASE Right 04/18/2021   Procedure: RIGHT ENDOSCOPIC CARPAL TUNNEL RELEASE;  Surgeon: Rober Chimera, MD;  Location: [facility];  Service: Orthopedics;  Laterality: Right;  LENTH OF SURGERY: 30 MINUTES   DILATION AND CURETTAGE OF UTERUS     FOOT SURGERY  02/27/2023   --- BI PLANAR OSTEOTOMY 1ST MPJ RT, FIXATION    Family History  Problem Relation Age of Onset   Hypertension Mother    Stroke Father    BRCA 1/2 Neg Hx    Breast cancer Neg Hx     No Known Allergies  Current Medications:   Current Outpatient Medications:    acetaminophen  (TYLENOL ) 325 MG tablet, Take 650 mg by mouth every 6 (six) hours as needed., Disp: , Rfl:    ciclopirox   (LOPROX ) 0.77 % cream, Apply topically 2 (two) times daily., Disp: 90 g, Rfl: 2   clonazePAM  (KLONOPIN ) 2 MG tablet, TAKE 1 TABLET BY MOUTH AT BEDTIME., Disp: 30 tablet, Rfl: 1   cyclobenzaprine  (FLEXERIL ) 10 MG tablet, TAKE 1 TABLET BY MOUTH DAILY AS NEEDED FOR MUSCLE SPASMS., Disp: 30 tablet, Rfl: 2   diclofenac  Sodium (VOLTAREN ) 1 % GEL, Apply 4 g topically 4 (four) times daily as needed., Disp: 100 g, Rfl: 3   famotidine -calcium  carbonate-magnesium  hydroxide (PEPCID  COMPLETE) 10-800-165 MG chewable tablet, Chew 1 tablet by mouth daily as needed., Disp: 100 tablet, Rfl: 11   gabapentin  (NEURONTIN ) 300 MG capsule, Take 1 capsule (300 mg total) by mouth 3 (three) times daily., Disp: 270 capsule, Rfl: 3   lisinopril -hydrochlorothiazide  (ZESTORETIC ) 10-12.5 MG tablet, TAKE 1 TABLET BY MOUTH EVERY DAY, Disp: 90 tablet, Rfl: 1   omeprazole (PRILOSEC) 40 MG capsule, Take 40 mg by mouth every morning., Disp: , Rfl:    ondansetron  (ZOFRAN -ODT) 4 MG disintegrating tablet, TAKE 1 TABLET BY MOUTH EVERY 8 HOURS AS NEEDED FOR NAUSEA AND VOMITING, Disp: 9 tablet, Rfl: 2   rosuvastatin  (CRESTOR ) 10 MG tablet, TAKE 1 TABLET (10 MG TOTAL) BY MOUTH DAILY FOR 30 DAYS, THEN 2 TABLETS (20 MG TOTAL) DAILY., Disp: 180 tablet, Rfl: 1   venlafaxine  XR (EFFEXOR -XR) 75 MG 24 hr capsule, Take 1 capsule (75 mg total) by mouth daily with breakfast., Disp: 90 capsule, Rfl: 1   Review of Systems:   Negative unless otherwise specified per HPI.  Vitals:   Vitals:   10/17/23 1336  BP: (!) 140/80  Pulse: 98  Temp: 98.6 F (37 C)  TempSrc: Temporal  SpO2: 96%  Weight: 146 lb 6.1 oz (66.4 kg)  Height: 5\' 3"  (1.6 m)     Body mass index is 25.93 kg/m.  Physical Exam:   Physical Exam Constitutional:      Appearance: Normal appearance. She is well-developed.  HENT:     Head: Normocephalic and atraumatic.  Eyes:     General: Lids  are normal.     Extraocular Movements: Extraocular movements intact.      Conjunctiva/sclera: Conjunctivae normal.  Pulmonary:     Effort: Pulmonary effort is normal.  Abdominal:     General: Abdomen is flat. Bowel sounds are normal.     Palpations: Abdomen is soft.     Tenderness: There is abdominal tenderness in the epigastric area. There is guarding.  Musculoskeletal:        General: Normal range of motion.     Cervical back: Normal range of motion and neck supple.  Skin:    General: Skin is warm and dry.  Neurological:     Mental Status: She is alert and oriented to person, place, and time.  Psychiatric:        Attention and Perception: Attention and perception normal.        Mood and Affect: Mood normal.        Behavior: Behavior normal.        Thought Content: Thought content normal.        Judgment: Judgment normal.     Assessment and Plan:   1. Epigastric pain (Primary) Due to severity of symptom(s) and appearance recommend patient is best served in ER setting Question pancreatitis vs ulcer vs acute chole vs other She is agreeable to ER -- her son will take her  I, Timoteo Force, acting as a Neurosurgeon for [employer], [place]., have documented all relevant documentation on the behalf of Alexander Iba, [place], as directed by  Alexander Iba, PA while in the presence of Alexander Iba, [place].  I, Alexander Iba, [place], have reviewed all documentation for this visit. The documentation on 10/17/23 for the exam, diagnosis, procedures, and orders are all accurate and complete.  Alexander Iba, PA-C

## 2023-10-17 NOTE — ED Provider Notes (Signed)
 [place] EMERGENCY DEPARTMENT AT [facility] Provider Note   CSN: [id number] Arrival date & time: 10/17/23  1411     History  Chief Complaint  Patient presents with   Abdominal Pain    Hillari MELODEE LUPE is a 58 y.o. female.  Patient is a 58 year old female with a past medical history of PUD, hypertension, hyperlipidemia presenting to the emergency department with abdominal pain.  The patient states that she has been dealing with abdominal pain on and off for about the last year.  She states over the last 2 months the pain has worsened and become more frequent.  She denies any associated fever.  She states she occasionally will have associated nausea and vomiting.  States that she did have diarrhea 2 days ago.  Denies any dysuria or hematuria.  She did report taking occasional ibuprofen  for headaches.  She was seen by her primary doctor today who recommended that she come to the ER for further evaluation.  She states that she has been taking her omeprazole daily as prescribed and takes Pepcid  as needed.  The history is provided by the patient.  Abdominal Pain      Home Medications Prior to Admission medications   Medication Sig Start Date End Date Taking? Authorizing Provider  sucralfate  (CARAFATE ) 1 g tablet Take 1 tablet (1 g total) by mouth 4 (four) times daily -  with meals and at bedtime. 10/17/23 11/16/23 Yes Kingsley, Tiago Humphrey K, DO  acetaminophen  (TYLENOL ) 325 MG tablet Take 650 mg by mouth every 6 (six) hours as needed.    [provider]  ciclopirox  (LOPROX ) 0.77 % cream Apply topically 2 (two) times daily. 12/25/22   Anthon Kins, MD  clonazePAM  (KLONOPIN ) 2 MG tablet TAKE 1 TABLET BY MOUTH AT BEDTIME. 08/14/23   Nwoko, Uchenna E, PA  cyclobenzaprine  (FLEXERIL ) 10 MG tablet TAKE 1 TABLET BY MOUTH DAILY AS NEEDED FOR MUSCLE SPASMS. 07/12/23   Anthon Kins, MD  diclofenac  Sodium (VOLTAREN ) 1 % GEL Apply 4 g topically 4 (four) times daily as needed.  12/25/22   Anthon Kins, MD  famotidine -calcium  carbonate-magnesium  hydroxide (PEPCID  COMPLETE) 10-800-165 MG chewable tablet Chew 1 tablet by mouth daily as needed. 09/25/22   Anthon Kins, MD  gabapentin  (NEURONTIN ) 300 MG capsule Take 1 capsule (300 mg total) by mouth 3 (three) times daily. 10/25/22   Anthon Kins, MD  lisinopril -hydrochlorothiazide  (ZESTORETIC ) 10-12.5 MG tablet TAKE 1 TABLET BY MOUTH EVERY DAY 08/12/23   Anthon Kins, MD  omeprazole (PRILOSEC) 40 MG capsule Take 40 mg by mouth every morning. 06/06/23   [provider]  ondansetron  (ZOFRAN -ODT) 4 MG disintegrating tablet Take 1 tablet (4 mg total) by mouth every 8 (eight) hours as needed for nausea or vomiting. 10/17/23   Kingsley, Arel Tippen K, DO  rosuvastatin  (CRESTOR ) 10 MG tablet TAKE 1 TABLET (10 MG TOTAL) BY MOUTH DAILY FOR 30 DAYS, THEN 2 TABLETS (20 MG TOTAL) DAILY. 04/11/23 10/17/23  Sheryle Donning, MD  venlafaxine  XR (EFFEXOR -XR) 75 MG 24 hr capsule Take 1 capsule (75 mg total) by mouth daily with breakfast. 09/12/23   Nwoko, Uchenna E, PA      Allergies    Patient has no known allergies.    Review of Systems   Review of Systems  Gastrointestinal:  Positive for abdominal pain.    Physical Exam Updated Vital Signs BP (!) 155/85   Pulse 89   Temp 98.9 F (37.2 C) (Oral)   Resp 18  Ht 5\' 3"  (1.6 m)   Wt 66.2 kg   LMP 12/20/2011   SpO2 100%   BMI 25.86 kg/m  Physical Exam Vitals and nursing note reviewed.  Constitutional:      General: She is not in acute distress.    Appearance: She is well-developed.  HENT:     Head: Normocephalic and atraumatic.     Mouth/Throat:     Mouth: Mucous membranes are moist.  Eyes:     Extraocular Movements: Extraocular movements intact.  Cardiovascular:     Rate and Rhythm: Normal rate and regular rhythm.     Heart sounds: Normal heart sounds.  Pulmonary:     Effort: Pulmonary effort is normal.     Breath sounds: Normal breath sounds.   Abdominal:     General: Abdomen is flat.     Palpations: Abdomen is soft.     Tenderness: There is generalized abdominal tenderness. There is guarding. There is no rebound.  Skin:    General: Skin is warm and dry.  Neurological:     General: No focal deficit present.     Mental Status: She is alert and oriented to person, place, and time.  Psychiatric:        Mood and Affect: Mood normal.        Behavior: Behavior normal.     ED Results / Procedures / Treatments   Labs (all labs ordered are listed, but only abnormal results are displayed) Labs Reviewed  COMPREHENSIVE METABOLIC PANEL WITH GFR - Abnormal; Notable for the following components:      Result Value   Glucose, Bld 105 (*)    Albumin 5.2 (*)    All other components within normal limits  URINALYSIS, ROUTINE W REFLEX MICROSCOPIC - Abnormal; Notable for the following components:   Hgb urine dipstick SMALL (*)    Protein, ur 30 (*)    All other components within normal limits  LIPASE, BLOOD  CBC    EKG EKG Interpretation Date/Time:  Wednesday October 17 2023 14:18:29 EDT Ventricular Rate:  93 PR Interval:  158 QRS Duration:  86 QT Interval:  352 QTC Calculation: 437 R Axis:   74  Text Interpretation: Normal sinus rhythm Normal ECG No significant change since last tracing Confirmed by Celesta Coke ([id number]) on 10/17/2023 4:04:36 PM  Radiology US  Abdomen Limited RUQ (LIVER/GB) Result Date: 10/17/2023 CLINICAL DATA:  Right upper quadrant pain EXAM: ULTRASOUND ABDOMEN LIMITED RIGHT UPPER QUADRANT COMPARISON:  Abdominal ultrasound 07/19/2022. CTA GI bleed 06/15/2023. MRI abdomen 07/24/2022. FINDINGS: Gallbladder: No gallstones or wall thickening visualized. No sonographic Murphy sign noted by sonographer. Common bile duct: Diameter: 4.6 mm Liver: Parenchymal echogenicity is within normal limits. 2 anechoic cysts are identified. One the left lobe measuring 2.1 x 1.3 x 1.8 cm, unchanged. One in the right lobe measuring 2.6  x 1.8 x 2.5 cm, not seen on prior. There is an additional hypoechoic lesion with mildly echogenic wall in the left lobe of the liver measuring 2.5 x 2.2 x 2.6 cm, unchanged from prior. Portal vein is patent on color Doppler imaging with normal direction of blood flow towards the liver. Other: None. IMPRESSION: 1. No acute sonographic findings in the right upper quadrant. 2. Stable hypoechoic lesion in the left lobe of the liver measuring up to 2.6 cm. This was previously characterized on MRI abdomen 07/24/2022. 3. Additional hepatic cysts are identified. Electronically Signed   By: Tyron Gallon M.D.   On: 10/17/2023 17:57    Procedures  Procedures    Medications Ordered in ED Medications  famotidine  (PEPCID ) tablet 20 mg (20 mg Oral Given 10/17/23 1658)  acetaminophen  (TYLENOL ) tablet 650 mg (650 mg Oral Given 10/17/23 1658)  alum & mag hydroxide-simeth (MAALOX/MYLANTA) 200-200-20 MG/5ML suspension 30 mL (30 mLs Oral Given 10/17/23 1658)  ondansetron  (ZOFRAN -ODT) disintegrating tablet 4 mg (4 mg Oral Given 10/17/23 1658)    ED Course/ Medical Decision Making/ A&P Clinical Course as of 10/17/23 1809  Wed Oct 17, 2023  1800 Stable appearing liver lesion, no acute abnormality otherwise on RUQ US . Suspect symptoms 2/2 GERD/PUD. Recommended daily pepcid , carafate  in addition to omeprazole and close GI follow up. [VK]    Clinical Course User Index [VK] Kingsley, Kayo Zion K, DO                                 Medical Decision Making This patient presents to the ED with chief complaint(s) of epigastric pain with pertinent past medical history of PUD, HTN which further complicates the presenting complaint. The complaint involves an extensive differential diagnosis and also carries with it a high risk of complications and morbidity.    The differential diagnosis includes gastritis, GERD, pancreatitis, PUD, cholelithiasis, cholecystitis, hepatitis, atypical ACS, gastroenteritis  Additional history  obtained: Additional history obtained from N/A Records reviewed Primary Care Documents  ED Course and Reassessment: On patient's arrival she is hemodynamically stable in no acute distress.  Initially had EKG and labs performed in triage.  EKG showed normal sinus rhythm without acute ischemic changes and labs are within normal range.  Will of right upper quadrant ultrasound to evaluate for possible hepatobiliary pathology.  Will be given GI cocktail and will be closely reassessed.  Independent labs interpretation:  The following labs were independently interpreted: within normal range  Independent visualization of imaging: - I independently visualized the following imaging with scope of interpretation limited to determining acute life threatening conditions related to emergency care: RUQ US , which revealed stable appearing liver lesions, no GB disease  Consultation: - Consulted or discussed management/test interpretation w/ external professional: N/A  Consideration for admission or further workup: Patient has no emergent conditions requiring admission or further work-up at this time and is stable for discharge home with primary care and GI follow-up  Social Determinants of health: N/A    Amount and/or Complexity of Data Reviewed Labs: ordered. Radiology: ordered.  Risk OTC drugs. Prescription drug management.          Final Clinical Impression(s) / ED Diagnoses Final diagnoses:  Epigastric pain    Rx / DC Orders ED Discharge Orders          Ordered    sucralfate  (CARAFATE ) 1 g tablet  3 times daily with meals & bedtime        10/17/23 1809    ondansetron  (ZOFRAN -ODT) 4 MG disintegrating tablet  Every 8 hours PRN        10/17/23 1809              Kingsley, Senita Corredor K, DO 10/17/23 1809

## 2023-10-17 NOTE — Discharge Instructions (Signed)
 You were seen in the emergency department for your abdominal pain.  Your blood work here was normal and your ultrasound showed no signs of gallstones or gallbladder disease.  Your pain is likely coming from within the stomach and you may have another stomach ulcer.  You need to follow-up with your GI doctor to have your symptoms rechecked and likely will need another scope to look inside your stomach.  You should continue to take your omeprazole every day and should also start taking Pepcid  every day for at least the next 2 weeks and I have given you an antacid called Carafate  to take with meals.  You can continue to use Zofran  as needed for nausea.  You should return to the emergency department if you have significantly worsening pain, repetitive vomiting despite the nausea, significant amount of blood in your vomit or black or bloody stools or any other new or concerning symptoms.

## 2023-10-17 NOTE — ED Triage Notes (Addendum)
 Patient to ED with upper abd pain and nausea intermittently for the past 3 weeks. Nausea worsened last night and reports Zofran  she took at home did not work. Intermittent diaphoresis. No vomiting, shortness of breath or specific chest pains.

## 2023-10-24 ENCOUNTER — Other Ambulatory Visit: Payer: Self-pay | Admitting: Internal Medicine

## 2023-10-24 DIAGNOSIS — M5440 Lumbago with sciatica, unspecified side: Secondary | ICD-10-CM

## 2023-10-24 NOTE — Telephone Encounter (Signed)
 Copied from CRM [id number]. Topic: Clinical - Medication Refill >> Oct 24, 2023 10:29 AM Keitha Pata L wrote: Most Recent Primary Care Visit:  Provider: Alexander Iba  Department: [facility]  Visit Type: ACUTE  Date: 10/17/2023  Medication: gabapentin  (NEURONTIN ) 300 MG capsule  Has the patient contacted their pharmacy? Yes (Agent: If no, request that the patient contact the pharmacy for the refill. If patient does not wish to contact the pharmacy document the reason why and proceed with request.) (Agent: If yes, when and what did the pharmacy advise?)  Is this the correct pharmacy for this prescription? Yes If no, delete pharmacy and type the correct one.  This is the patient's preferred pharmacy:  [id number] [place], [place] - [address] [place] [address] Phone: [phone] Fax: [phone]   Has the prescription been filled recently? No  Is the patient out of the medication? Yes  Has the patient been seen for an appointment in the last year OR does the patient have an upcoming appointment? Yes  Can we respond through MyChart? Yes  Agent: Please be advised that Rx refills may take up to 3 business days. We ask that you follow-up with your pharmacy.

## 2023-10-25 MED ORDER — GABAPENTIN 300 MG PO CAPS: 300.0000 mg | ORAL_CAPSULE | Freq: Three times a day (TID) | ORAL | 3 refills | Status: DC

## 2023-11-02 ENCOUNTER — Telehealth (INDEPENDENT_AMBULATORY_CARE_PROVIDER_SITE_OTHER): Admitting: Physician Assistant

## 2023-11-02 ENCOUNTER — Encounter (HOSPITAL_COMMUNITY): Payer: Self-pay | Admitting: Physician Assistant

## 2023-11-02 DIAGNOSIS — F411 Generalized anxiety disorder: Secondary | ICD-10-CM

## 2023-11-02 DIAGNOSIS — F331 Major depressive disorder, recurrent, moderate: Secondary | ICD-10-CM | POA: Diagnosis not present

## 2023-11-02 MED ORDER — VENLAFAXINE HCL ER 75 MG PO CP24: 75.0000 mg | ORAL_CAPSULE | Freq: Every day | ORAL | 1 refills | Status: DC

## 2023-11-02 MED ORDER — CLONAZEPAM 2 MG PO TABS
2.0000 mg | ORAL_TABLET | Freq: Every day | ORAL | 0 refills | Status: DC
Start: 2023-11-02 — End: 2023-12-04

## 2023-11-02 NOTE — Progress Notes (Signed)
 BH MD/PA/NP OP Progress Note  Virtual Visit via Video Note  I connected with Amanda White on 11/02/23 at  3:00 PM EDT by a video enabled telemedicine application and verified that I am speaking with the correct person using two identifiers.  Location: Patient: Home Provider: Clinic   I discussed the limitations of evaluation and management by telemedicine and the availability of in person appointments. The patient expressed understanding and agreed to proceed.  Follow Up Instructions:  I discussed the assessment and treatment plan with the patient. The patient was provided an opportunity to ask questions and all were answered. The patient agreed with the plan and demonstrated an understanding of the instructions.   The patient was advised to call back or seek an in-person evaluation if the symptoms worsen or if the condition fails to improve as anticipated.  I provided 15 minutes of non-face-to-face time during this encounter.  Gates Kasal, PA    11/02/2023 6:23 PM Amanda White  MRN:  [id number]  Chief Complaint:  Chief Complaint  Patient presents with   Follow-up   Medication Refill   HPI:   Amanda White is a 58 year old female with a past psychiatric history significant for generalized anxiety disorder and major depressive disorder who presents to [facility] via virtual video visit for follow-up and medication management.  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  She reports that she continues to use her venlafaxine  regularly and that the medication has been helpful in managing her depressive symptoms and anxiety.   Patient denies experiencing any adverse side effects from her use of venlafaxine .  Patient also reports that she continues to take clonazepam  as needed for the management of her anxiety.  She reports that when she takes the medication, it goes into effect in roughly 30 to 40 minutes.  She reports that her use of clonazepam  last throughout the whole night, but she may occasionally experience waking up in the middle of the night.  Patient denies experiencing any adverse side effects from her use of clonazepam .  Patient endorses some depression attributed to the anniversary of her father's passing.  Patient rates her depression as 4 or 5 out of 10 with 10 being most severe.  Patient endorses depressive episodes 2 to 3 days out of the week.  Patient endorses the following depressive symptoms: irritability and sadness.  In addition to taking her medications regularly, patient reports that she has a friend that helps her to her depression.  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  A PHQ-9 screen was performed with the patient scoring a 10.  A GAD-7 screen was also performed with the patient scored in 12.  Patient is alert and oriented x 4, calm, cooperative, and fully engaged in conversation during the encounter.  Patient endorses pretty good mood.  Patient exhibits depressed mood with appropriate affect.  Patient denies suicidal or homicidal ideations.  She further denies auditory or visual hallucinations and does not appear to be responding to internal/external stimuli.  Patient endorses good sleep and receives on average 6 to 8 hours of  sleep per night.  Patient endorses good appetite needs on average 3 meals per day.  Patient denies alcohol consumption, tobacco use, or illicit drug use.  Visit Diagnosis:    ICD-10-CM   1. Generalized anxiety disorder  F41.1 clonazePAM  (KLONOPIN ) 2 MG tablet    venlafaxine  XR (EFFEXOR -XR) 75  MG 24 hr capsule    2. Moderate episode of recurrent major depressive disorder (HCC)  F33.1 venlafaxine  XR (EFFEXOR -XR) 75 MG 24 hr capsule      Past Psychiatric History:  Patient reports that she has a history of mental health and has been treated for the last 20 years.  She reports that a lot of her mental health extends from her husband.  Patient is diagnosed with generalized anxiety disorder that is being managed by her primary care provider   Patient denies a past history of hospitalization due to mental health   Patient denies a past history of suicide attempts Patient denies a past history of homicide attempts  Past Medical History:  Past Medical History:  Diagnosis Date   Abnormal cardiac CT angiography 10/12/2022   Small intermediate density pericardial effusion versus thickening. Consider further evaluation with echocardiography.   Anxiety    Bipolar 1 disorder (HCC)    Carpal tunnel syndrome 09/25/2022   R sided release 2022, all better now.   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   Depression    Depression    Dizziness 10/09/2022   Saw emergency room 10/03/22   GERD (gastroesophageal reflux disease)    History of weight loss 12/25/2022   Hypertension    LFT elevation 12/25/2022   Lab Results      Component    Value    Date/Time           ALT    28    12/25/2022 10:31 AM           ALT    99 (H)    09/25/2022 09:53 AM           ALT    22    06/22/2020 01:26 PM           ALT    28    05/19/2018 09:51 PM           ALT    13    01/03/2012 09:55 AM             Lab Results      Component    Value    Date/Time           AST    25    12/25/2022 10:31 AM           AST    40 (H)    0   Migraine    Pressure ulcer 11/28/2022   Subcutaneous nodule  09/25/2022   Unintentional weight loss 09/25/2022   November 28, 2022 interim history:   Denies any heartburn, prior abdomen/flank pain gone, just some right upper chest pain now, gastrointestinal did endoscopy and found ulcer but we don't have report.  Off advil . Weight stable. Continuing remeron          Wt Readings from Last 5 Encounters:  11/28/22  125 lb 6.4 oz (56.9 kg)  11/24/22  125 lb 6.4 oz (56.9 kg)  11/16/22  125 lb 11.2 oz (57 kg)  10/25/22     Past Surgical History:  Procedure Laterality Date   CARPAL TUNNEL RELEASE Right 04/18/2021   Procedure: RIGHT ENDOSCOPIC CARPAL TUNNEL RELEASE;  Surgeon: Rober Chimera, MD;  Location: [facility];  Service: Orthopedics;  Laterality: Right;  LENTH OF SURGERY: 30 MINUTES   DILATION AND CURETTAGE OF UTERUS     FOOT SURGERY  02/27/2023   --- BI PLANAR OSTEOTOMY 1ST MPJ RT, FIXATION    Family Psychiatric History:  Patient reports that her mother's side of the family struggles with mental health.  She reports that her mother's side of the family from [place] and states that many family members saw many of the atrocities during World War II.   Family history of suicide attempts: Patient denies Family history of homicide attempts: Patient denies Family history of substance abuse: Patient reports that her nephew abused heroin.  She reports that her brother uses marijuana frequently.  She reports that her father was an alcoholic  Family History:  Family History  Problem Relation Age of Onset   Hypertension Mother    Stroke Father    BRCA 1/2 Neg Hx    Breast cancer Neg Hx     Social History:  Social History   Socioeconomic History   Marital status: Married    Spouse name: Not on file   Number of children: Not on file   Years of education: Not on file   Highest education level: Bachelor's degree (e.g., BA, AB, BS)  Occupational History   Not on file  Tobacco Use   Smoking status: Never   Smokeless tobacco: Never   Substance and Sexual Activity   Alcohol use: No    Comment: occas.   Drug use: No   Sexual activity: Not on file  Other Topics Concern   Not on file  Social History Narrative   Not on file   Social Drivers of Health   Financial Resource Strain: Medium Risk (05/31/2023)   Overall Financial Resource Strain (CARDIA)    Difficulty of Paying Living Expenses: Somewhat hard  Food Insecurity: No Food Insecurity (05/31/2023)   Hunger Vital Sign    Worried About Running Out of Food in the Last Year: Never true    Ran Out of Food in the Last Year: Never true  Transportation Needs: No Transportation Needs (04/30/2023)   PRAPARE - Administrator, Civil Service (Medical): No    Lack of Transportation (Non-Medical): No  Physical Activity: Inactive (05/31/2023)   Exercise Vital Sign    Days of Exercise per Week: 0 days    Minutes of Exercise per Session: 0 min  Stress: Stress Concern Present (05/31/2023)   [employer] of Occupational Health - Occupational Stress Questionnaire    Feeling of Stress : Very much  Social Connections: Socially Isolated (05/31/2023)   Social Connection and Isolation Panel [NHANES]    Frequency of Communication with Friends and Family: Once a week    Frequency of Social Gatherings with Friends and Family: Never    Attends Religious Services: Never    Database administrator or Organizations: No    Attends Engineer, structural: Never    Marital Status: Married    Allergies: No Known Allergies  Metabolic Disorder Labs: No results found for: "HGBA1C", "MPG" No results found for: "PROLACTIN" Lab Results  Component Value Date   CHOL 179 05/02/2023  TRIG 93.0 05/02/2023   HDL 74.30 05/02/2023   CHOLHDL 2 05/02/2023   VLDL 18.6 05/02/2023   LDLCALC 86 05/02/2023   LDLCALC 124 (H) 09/25/2022   Lab Results  Component Value Date   TSH 1.31 11/21/2022   TSH 0.63 09/25/2022    Therapeutic Level Labs: No results found for: "LITHIUM" No  results found for: "VALPROATE" No results found for: "CBMZ"  Current Medications: Current Outpatient Medications  Medication Sig Dispense Refill   acetaminophen  (TYLENOL ) 325 MG tablet Take 650 mg by mouth every 6 (six) hours as needed.     ciclopirox  (LOPROX ) 0.77 % cream Apply topically 2 (two) times daily. 90 g 2   clonazePAM  (KLONOPIN ) 2 MG tablet Take 1 tablet (2 mg total) by mouth at bedtime. 30 tablet 0   cyclobenzaprine  (FLEXERIL ) 10 MG tablet TAKE 1 TABLET BY MOUTH DAILY AS NEEDED FOR MUSCLE SPASMS. 30 tablet 2   diclofenac  Sodium (VOLTAREN ) 1 % GEL Apply 4 g topically 4 (four) times daily as needed. 100 g 3   famotidine -calcium  carbonate-magnesium  hydroxide (PEPCID  COMPLETE) 10-800-165 MG chewable tablet Chew 1 tablet by mouth daily as needed. 100 tablet 11   gabapentin  (NEURONTIN ) 300 MG capsule Take 1 capsule (300 mg total) by mouth 3 (three) times daily. 270 capsule 3   lisinopril -hydrochlorothiazide  (ZESTORETIC ) 10-12.5 MG tablet TAKE 1 TABLET BY MOUTH EVERY DAY 90 tablet 1   omeprazole (PRILOSEC) 40 MG capsule Take 40 mg by mouth every morning.     ondansetron  (ZOFRAN -ODT) 4 MG disintegrating tablet Take 1 tablet (4 mg total) by mouth every 8 (eight) hours as needed for nausea or vomiting. 15 tablet 2   rosuvastatin  (CRESTOR ) 10 MG tablet TAKE 1 TABLET (10 MG TOTAL) BY MOUTH DAILY FOR 30 DAYS, THEN 2 TABLETS (20 MG TOTAL) DAILY. 180 tablet 1   sucralfate  (CARAFATE ) 1 g tablet Take 1 tablet (1 g total) by mouth 4 (four) times daily -  with meals and at bedtime. 120 tablet 0   venlafaxine  XR (EFFEXOR -XR) 75 MG 24 hr capsule Take 1 capsule (75 mg total) by mouth daily with breakfast. 90 capsule 1   No current facility-administered medications for this visit.     Musculoskeletal: Strength & Muscle Tone: within normal limits Gait & Station: normal Patient leans: N/A  Psychiatric Specialty Exam: Review of Systems  Psychiatric/Behavioral:  Positive for dysphoric mood. Negative  for decreased concentration, hallucinations, self-injury, sleep disturbance and suicidal ideas. The patient is nervous/anxious. The patient is not hyperactive.     Last menstrual period 12/20/2011.There is no height or weight on file to calculate BMI.  General Appearance: Casual  Eye Contact:  Good  Speech:  Clear and Coherent and Normal Rate  Volume:  Normal  Mood:  Anxious and Depressed  Affect:  Appropriate  Thought Process:  Coherent, Goal Directed, and Descriptions of Associations: Intact  Orientation:  Full (Time, Place, and Person)  Thought Content: WDL   Suicidal Thoughts:  No  Homicidal Thoughts:  No  Memory:  Immediate;   Good Recent;   Good Remote;   Good  Judgement:  Good  Insight:  Good  Psychomotor Activity:  Normal  Concentration:  Concentration: Good and Attention Span: Good  Recall:  Good  Fund of Knowledge: Good  Language: Good  Akathisia:  No  Handed:  Right  AIMS (if indicated): not done  Assets:  Communication Skills Desire for Improvement Housing Social Support Transportation Vocational/Educational  ADL's:  Intact  Cognition: WNL  Sleep:  Good   Screenings: GAD-7    Flowsheet Row Video Visit from 11/02/2023 in [facility] Clinical Support from 09/12/2023 in [facility] Video Visit from 07/18/2023 in [facility] Clinical Support from 06/06/2023 in [facility] Counselor from 05/31/2023 in [facility]  Total GAD-7 Score 12 12 2 12 14       PHQ2-9    Flowsheet Row Video Visit from 11/02/2023 in [facility] Clinical Support from 09/12/2023 in [facility] Video Visit from 07/18/2023 in [facility] Clinical Support from 06/06/2023 in [facility] Counselor from 05/31/2023 in [facility]  PHQ-2 Total Score 4 3 0 3 2  PHQ-9 Total Score 10 11 -- 8 7      Flowsheet Row Video Visit from 11/02/2023 in [facility] ED from 10/17/2023 in [facility] Emergency Department at [facility] Clinical Support from 09/12/2023 in [facility]  C-SSRS RISK CATEGORY No Risk No Risk No Risk        Assessment and Plan:   Amanda White is a 58 year old female with a past psychiatric history significant for generalized anxiety disorder and major depressive disorder who presents to [facility] via virtual video visit for follow-up and medication management.  Patient presents to the encounter stating that she continues to take her medications regularly.  Patient denies experiencing any adverse side effects with her medication regimen and denies the need for dosage adjustments at this time.  Patient endorses some depression and anxiety attributed to the anniversary of her father's passing.  Despite experiencing some depression and anxiety, patient reports that her medications appear to be helpful in managing her symptoms.  A PHQ-9 screen was performed with the patient scoring a 10.  A GAD-7 screen was also performed with the patient scoring a 12.  Patient to continue taking her medications as prescribed.  Patient's medications to be e-prescribed to pharmacy of choice.  Patient denies experiencing any adverse side effects from her use of clonazepam .  She reports that the medication continues to be helpful with managing her anxiety at night.  Patient's most recent urine drug screen was performed on 08/13/2023.  Patient's results were negative for any illicit substances.  It should be noted that clonazepam  is occasionally not able to be detected in a urine drug screen.  Patient denies suicidal ideations and is able to contract for safety following the conclusion of the encounter.  Collaboration of  Care: Collaboration of Care: Medication Management AEB provider managing patient's psychiatric medications, Primary Care Provider AEB patient being seen by her primary care provider, Psychiatrist AEB patient being seen by mental health provider at this facility, and Other provider involved in patient's care AEB patient being seen by neurology, cardiology, and OB/GYN  Patient/Guardian was advised Release of Information must be obtained prior to any record release in order to collaborate their care with an outside provider. Patient/Guardian was advised if they have not already done so to contact the registration department to sign all necessary forms in order for us  to release information regarding their care.   Consent: Patient/Guardian gives verbal consent for treatment and assignment of benefits for services provided during this visit. Patient/Guardian expressed understanding and agreed to proceed.   1. Generalized anxiety disorder  - clonazePAM  (KLONOPIN ) 2 MG tablet; Take 1  tablet (2 mg total) by mouth at bedtime.  Dispense: 30 tablet; Refill: 0 - venlafaxine  XR (EFFEXOR -XR) 75 MG 24 hr capsule; Take 1 capsule (75 mg total) by mouth daily with breakfast.  Dispense: 90 capsule; Refill: 1  2. Moderate episode of recurrent major depressive disorder (HCC)  - venlafaxine  XR (EFFEXOR -XR) 75 MG 24 hr capsule; Take 1 capsule (75 mg total) by mouth daily with breakfast.  Dispense: 90 capsule; Refill: 1  Patient to follow-up in 2 months Provider spent a total of 15 minutes with the patient/reviewing patient's chart  Gates Kasal, PA 11/02/2023, 6:23 PM

## 2023-11-05 DIAGNOSIS — Z419 Encounter for procedure for purposes other than remedying health state, unspecified: Secondary | ICD-10-CM | POA: Diagnosis not present

## 2023-11-17 ENCOUNTER — Other Ambulatory Visit: Payer: Self-pay | Admitting: Internal Medicine

## 2023-11-17 DIAGNOSIS — R5383 Other fatigue: Secondary | ICD-10-CM

## 2023-11-17 DIAGNOSIS — M5440 Lumbago with sciatica, unspecified side: Secondary | ICD-10-CM

## 2023-12-03 ENCOUNTER — Other Ambulatory Visit (HOSPITAL_COMMUNITY): Payer: Self-pay | Admitting: Physician Assistant

## 2023-12-03 DIAGNOSIS — F411 Generalized anxiety disorder: Secondary | ICD-10-CM

## 2023-12-04 ENCOUNTER — Telehealth (HOSPITAL_COMMUNITY): Payer: Self-pay

## 2023-12-04 NOTE — Telephone Encounter (Signed)
 Hello,   Patient called and asked for a refill of Klonopin  not sure if she needs a UDS done ? Please let me know so I can call back pt.

## 2023-12-04 NOTE — Telephone Encounter (Signed)
 Patient's medication was sent to preferred pharmacy. She does not need to have another urine drug screen done. Most recent urine drug screen was performed on 08/13/2023.

## 2023-12-06 DIAGNOSIS — Z419 Encounter for procedure for purposes other than remedying health state, unspecified: Secondary | ICD-10-CM | POA: Diagnosis not present

## 2023-12-29 ENCOUNTER — Other Ambulatory Visit (HOSPITAL_COMMUNITY): Payer: Self-pay | Admitting: Physician Assistant

## 2023-12-29 DIAGNOSIS — F411 Generalized anxiety disorder: Secondary | ICD-10-CM

## 2024-01-03 ENCOUNTER — Encounter (HOSPITAL_COMMUNITY): Admitting: Physician Assistant

## 2024-01-05 DIAGNOSIS — Z419 Encounter for procedure for purposes other than remedying health state, unspecified: Secondary | ICD-10-CM | POA: Diagnosis not present

## 2024-01-14 ENCOUNTER — Ambulatory Visit (INDEPENDENT_AMBULATORY_CARE_PROVIDER_SITE_OTHER): Admitting: Physician Assistant

## 2024-01-14 VITALS — BP 132/69 | HR 73 | Temp 98.1°F | Ht 63.0 in | Wt 149.8 lb

## 2024-01-14 DIAGNOSIS — F331 Major depressive disorder, recurrent, moderate: Secondary | ICD-10-CM | POA: Diagnosis not present

## 2024-01-14 DIAGNOSIS — F411 Generalized anxiety disorder: Secondary | ICD-10-CM

## 2024-01-16 ENCOUNTER — Encounter (HOSPITAL_COMMUNITY): Payer: Self-pay | Admitting: Physician Assistant

## 2024-01-16 MED ORDER — CLONAZEPAM 2 MG PO TABS
2.0000 mg | ORAL_TABLET | Freq: Every evening | ORAL | 0 refills | Status: DC | PRN
Start: 2024-02-02 — End: 2024-02-27

## 2024-01-16 MED ORDER — VENLAFAXINE HCL ER 75 MG PO CP24: 75.0000 mg | ORAL_CAPSULE | Freq: Every day | ORAL | 1 refills | Status: DC

## 2024-01-16 NOTE — Progress Notes (Signed)
 BH MD/PA/NP OP Progress Note  Virtual Visit via Video Note  I connected with Terrah VANICE RAPPA on 01/14/24 at  4:00 PM EDT by a video enabled telemedicine application and verified that I am speaking with the correct person using two identifiers.  Location: Patient: Home Provider: Clinic   I discussed the limitations of evaluation and management by telemedicine and the availability of in person appointments. The patient expressed understanding and agreed to proceed.  Follow Up Instructions:  I discussed the assessment and treatment plan with the patient. The patient was provided an opportunity to ask questions and all were answered. The patient agreed with the plan and demonstrated an understanding of the instructions.   The patient was advised to call back or seek an in-person evaluation if the symptoms worsen or if the condition fails to improve as anticipated.  I provided 49 minutes of non-face-to-face time during this encounter.  Reginia FORBES Bolster, PA    01/14/2024 4:00 PM Kalin NAVEYAH IACOVELLI  MRN:  [id number]  Chief Complaint:  Chief Complaint  Patient presents with   Follow-up   Medication Refill   HPI:   Mickala M. Sliwinski is a 58 year old female with a past psychiatric history significant for generalized anxiety disorder and major depressive disorder who presents to [facility] via virtual video visit for follow-up and medication management.  Patient is currently being managed on the following psychiatric medication:  Venlafaxine  XR (Effexor  XR) 75 mg daily Clonazepam  2 mg at bedtime  Patient presents to the encounter stating that she most recently lost her grandson due to a motor vehicle accident.  Patient is deeply saddened by the recent event and reports that all she does as of late has been sleep and experienced crying spells.  She continues to endorse worsening depression attributed to the recent event but states that she  continues to take her medications regularly.  A PHQ-9 screen was performed with the patient scoring a 19.  A GAD-7 screen was also performed with the patient scoring a 19.  Patient reports that she continues to take her clonazepam  regularly and states that the medication has been helpful.  She believes that the medication may go into effect roughly 30 minutes after taking the medication.  She reports that her use of clonazepam  last throughout the night.  Patient denies experiencing any adverse side effects associated with her use of clonazepam .  Patient is alert and oriented x 4, calm, cooperative, and fully engaged in conversation during the encounter.  Patient describes her mood as sad.  Patient exhibits depressed mood with congruent affect.  Patient denies suicidal or homicidal ideations.  She further denies auditory or visual hallucinations and does not appear to be responding to internal/external stimuli.  Patient endorses hypersomnia and states that she recently received up to 15 hours of sleep during the day.  Patient endorses decreased appetite stating that she often forgets to eat.  Patient denies alcohol consumption, tobacco use, or illicit drug use.  Visit Diagnosis:    ICD-10-CM   1. Generalized anxiety disorder  F41.1 clonazePAM  (KLONOPIN ) 2 MG tablet    venlafaxine  XR (EFFEXOR -XR) 75 MG 24 hr capsule    2. Moderate episode of recurrent major depressive disorder (HCC)  F33.1 venlafaxine  XR (EFFEXOR -XR) 75 MG 24 hr capsule    3. Long-term current use of benzodiazepine  Z79.899       Past Psychiatric History:  Patient reports that she has a history of mental health and has  been treated for the last 20 years.  She reports that a lot of her mental health extends from her husband.  Patient is diagnosed with generalized anxiety disorder that is being managed by her primary care provider   Patient denies a past history of hospitalization due to mental health   Patient denies a past  history of suicide attempts Patient denies a past history of homicide attempts  Past Medical History:  Past Medical History:  Diagnosis Date   Abnormal cardiac CT angiography 10/12/2022   Small intermediate density pericardial effusion versus thickening. Consider further evaluation with echocardiography.   Anxiety    Bipolar 1 disorder (HCC)    Carpal tunnel syndrome 09/25/2022   R sided release 2022, all better now.   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   Depression    Depression    Dizziness 10/09/2022   Saw emergency room 10/03/22   GERD (gastroesophageal reflux disease)    History of weight loss 12/25/2022   Hypertension    LFT elevation 12/25/2022   Lab Results      Component    Value    Date/Time           ALT    28    12/25/2022 10:31 AM           ALT    99 (H)    09/25/2022 09:53 AM           ALT    22    06/22/2020 01:26 PM           ALT    28    05/19/2018 09:51 PM           ALT    13    01/03/2012 09:55 AM             Lab Results      Component    Value    Date/Time           AST    25    12/25/2022 10:31 AM           AST    40 (H)    0   Migraine    Pressure ulcer 11/28/2022   Subcutaneous nodule 09/25/2022   Unintentional weight loss 09/25/2022   November 28, 2022 interim history:   Denies any heartburn, prior abdomen/flank pain gone, just some right upper chest pain now, gastrointestinal did endoscopy and found ulcer but we don't have report.  Off advil . Weight stable. Continuing remeron          Wt Readings from Last 5 Encounters:  11/28/22  125 lb 6.4 oz (56.9 kg)  11/24/22  125 lb 6.4 oz (56.9 kg)  11/16/22  125 lb 11.2 oz (57 kg)  10/25/22     Past Surgical History:  Procedure Laterality Date   CARPAL TUNNEL RELEASE Right 04/18/2021    Procedure: RIGHT ENDOSCOPIC CARPAL TUNNEL RELEASE;  Surgeon: Sebastian Lenis, MD;  Location: [facility];  Service: Orthopedics;  Laterality: Right;  LENTH OF SURGERY: 30 MINUTES   DILATION AND CURETTAGE OF UTERUS     FOOT SURGERY  02/27/2023   --- BI PLANAR OSTEOTOMY 1ST MPJ RT, FIXATION    Family Psychiatric History:  Patient reports  that her mother's side of the family struggles with mental health.  She reports that her mother's side of the family from [place] and states that many family members saw many of the atrocities during World War II.   Family history of suicide attempts: Patient denies Family history of homicide attempts: Patient denies Family history of substance abuse: Patient reports that her nephew abused heroin.  She reports that her brother uses marijuana frequently.  She reports that her father was an alcoholic  Family History:  Family History  Problem Relation Age of Onset   Hypertension Mother    Stroke Father    BRCA 1/2 Neg Hx    Breast cancer Neg Hx     Social History:  Social History   Socioeconomic History   Marital status: Married    Spouse name: Not on file   Number of children: Not on file   Years of education: Not on file   Highest education level: Bachelor's degree (e.g., BA, AB, BS)  Occupational History   Not on file  Tobacco Use   Smoking status: Never   Smokeless tobacco: Never  Substance and Sexual Activity   Alcohol use: No    Comment: occas.   Drug use: No   Sexual activity: Not on file  Other Topics Concern   Not on file  Social History Narrative   Not on file   Social Drivers of Health   Financial Resource Strain: Medium Risk (05/31/2023)   Overall Financial Resource Strain (CARDIA)    Difficulty of Paying Living Expenses: Somewhat hard  Food Insecurity: No Food Insecurity (05/31/2023)   Hunger Vital Sign    Worried About Running Out of Food in the Last Year: Never true    Ran Out of Food in the Last Year: Never  true  Transportation Needs: No Transportation Needs (04/30/2023)   PRAPARE - Administrator, Civil Service (Medical): No    Lack of Transportation (Non-Medical): No  Physical Activity: Inactive (05/31/2023)   Exercise Vital Sign    Days of Exercise per Week: 0 days    Minutes of Exercise per Session: 0 min  Stress: Stress Concern Present (05/31/2023)   [employer] of Occupational Health - Occupational Stress Questionnaire    Feeling of Stress : Very much  Social Connections: Socially Isolated (05/31/2023)   Social Connection and Isolation Panel    Frequency of Communication with Friends and Family: Once a week    Frequency of Social Gatherings with Friends and Family: Never    Attends Religious Services: Never    Database administrator or Organizations: No    Attends Engineer, structural: Never    Marital Status: Married    Allergies: No Known Allergies  Metabolic Disorder Labs: No results found for: HGBA1C, MPG No results found for: PROLACTIN Lab Results  Component Value Date   CHOL 179 05/02/2023   TRIG 93.0 05/02/2023   HDL 74.30 05/02/2023   CHOLHDL 2 05/02/2023   VLDL 18.6 05/02/2023   LDLCALC 86 05/02/2023   LDLCALC 124 (H) 09/25/2022   Lab Results  Component Value Date   TSH 1.31 11/21/2022   TSH 0.63 09/25/2022    Therapeutic Level Labs: No results found for: LITHIUM No results found for: VALPROATE No results found for: CBMZ  Current Medications: Current Outpatient Medications  Medication Sig Dispense Refill   acetaminophen  (TYLENOL ) 325 MG tablet Take 650 mg by mouth every 6 (six) hours as needed.  ciclopirox  (LOPROX ) 0.77 % cream Apply topically 2 (two) times daily. 90 g 2   [START ON 02/02/2024] clonazePAM  (KLONOPIN ) 2 MG tablet Take 1 tablet (2 mg total) by mouth at bedtime as needed for anxiety. 30 tablet 0   cyclobenzaprine  (FLEXERIL ) 10 MG tablet TAKE 1 TABLET BY MOUTH DAILY AS NEEDED FOR MUSCLE SPASMS. 30 tablet  2   diclofenac  Sodium (VOLTAREN ) 1 % GEL Apply 4 g topically 4 (four) times daily as needed. 100 g 3   famotidine -calcium  carbonate-magnesium  hydroxide (PEPCID  COMPLETE) 10-800-165 MG chewable tablet Chew 1 tablet by mouth daily as needed. 100 tablet 11   gabapentin  (NEURONTIN ) 300 MG capsule Take 1 capsule (300 mg total) by mouth 3 (three) times daily. 270 capsule 3   lisinopril -hydrochlorothiazide  (ZESTORETIC ) 10-12.5 MG tablet TAKE 1 TABLET BY MOUTH EVERY DAY 90 tablet 1   omeprazole (PRILOSEC) 40 MG capsule Take 40 mg by mouth every morning.     ondansetron  (ZOFRAN -ODT) 4 MG disintegrating tablet Take 1 tablet (4 mg total) by mouth every 8 (eight) hours as needed for nausea or vomiting. 15 tablet 2   rosuvastatin  (CRESTOR ) 10 MG tablet TAKE 1 TABLET (10 MG TOTAL) BY MOUTH DAILY FOR 30 DAYS, THEN 2 TABLETS (20 MG TOTAL) DAILY. 180 tablet 1   sucralfate  (CARAFATE ) 1 g tablet Take 1 tablet (1 g total) by mouth 4 (four) times daily -  with meals and at bedtime. 120 tablet 0   venlafaxine  XR (EFFEXOR -XR) 75 MG 24 hr capsule Take 1 capsule (75 mg total) by mouth daily with breakfast. 90 capsule 1   No current facility-administered medications for this visit.     Musculoskeletal: Strength & Muscle Tone: within normal limits Gait & Station: normal Patient leans: N/A  Psychiatric Specialty Exam: Review of Systems  Psychiatric/Behavioral:  Positive for dysphoric mood. Negative for decreased concentration, hallucinations, self-injury, sleep disturbance and suicidal ideas. The patient is nervous/anxious. The patient is not hyperactive.     Blood pressure 132/69, pulse 73, temperature 98.1 F (36.7 C), temperature source Oral, height 5' 3 (1.6 m), weight 149 lb 12.8 oz (67.9 kg), last menstrual period 12/20/2011, SpO2 95%.Body mass index is 26.54 kg/m.  General Appearance: Casual  Eye Contact:  Good  Speech:  Clear and Coherent and Normal Rate  Volume:  Normal  Mood:  Anxious and Depressed   Affect:  Appropriate  Thought Process:  Coherent, Goal Directed, and Descriptions of Associations: Intact  Orientation:  Full (Time, Place, and Person)  Thought Content: WDL   Suicidal Thoughts:  No  Homicidal Thoughts:  No  Memory:  Immediate;   Good Recent;   Good Remote;   Good  Judgement:  Good  Insight:  Good  Psychomotor Activity:  Normal  Concentration:  Concentration: Good and Attention Span: Good  Recall:  Good  Fund of Knowledge: Good  Language: Good  Akathisia:  No  Handed:  Right  AIMS (if indicated): not done  Assets:  Communication Skills Desire for Improvement Housing Social Support Transportation Vocational/Educational  ADL's:  Intact  Cognition: WNL  Sleep:  Good   Screenings: GAD-7    Flowsheet Row Clinical Support from 01/14/2024 in [facility] Video Visit from 11/02/2023 in [facility] Clinical Support from 09/12/2023 in [facility] Video Visit from 07/18/2023 in [facility] Clinical Support from 06/06/2023 in [facility]  Total GAD-7 Score 19 12 12 2 12    [id number]  Flowsheet Row Clinical Support from 01/14/2024 in [facility] Video Visit from 11/02/2023 in [facility] Clinical Support from 09/12/2023 in [facility] Video Visit from 07/18/2023 in [facility] Clinical Support from 06/06/2023 in [facility]  PHQ-2 Total Score 6 4 3  0 3  PHQ-9 Total Score 19 10 11  -- 8   Flowsheet Row Clinical Support from 01/14/2024 in [facility] Video Visit from 11/02/2023 in [facility] ED from 10/17/2023 in [facility] Emergency Department at [facility]  C-SSRS RISK CATEGORY No Risk No Risk No Risk     Assessment and Plan:    Makesha M. Renstrom is a 58 year old female with a past psychiatric history significant for generalized anxiety disorder and major depressive disorder who presents to [facility] via virtual video visit for follow-up and medication management.  Patient presents to the encounter stating that she has been taking her medications regularly.  She denied experiencing any adverse side effects associated with taking her medications, especially her clonazepam  use.  PDMP was reviewed prior to the conclusion of the encounter.  Patient's most recent urine drug screen was performed on 08/13/2023 and results were negative for any illicit substances.  Since last encounter, patient has been experiencing worsening depression and anxiety attributed to the recent passing of her grandson.  She reports that her grandson lost his wife in a motor vehicle accident and she has been distraught ever since.  Since experiencing this event, patient reports that she has been experiencing crying spells and sleeping more.  A PHQ-9 screen was performed with the patient scoring a 19.  A GAD-7 screen was also performed with the patient scoring a 19.  Despite her worsening depression and anxiety, patient reports that she still continues to experience benefit from the use of her medications.  Patient denies the need for dosage adjustments at this time.  Patient to continue taking her medications as prescribed.  Patient's medications to be prescribed to pharmacy of choice.  A [place] Suicide Severity Rating Scale was performed with the patient being considered no risk.  Patient denies suicidal ideations and is able to contract for her safety following the conclusion of the encounter.  Collaboration of Care: Collaboration of Care: Medication Management AEB provider managing patient's psychiatric medications, Primary Care Provider AEB patient being seen by her primary care provider, Psychiatrist AEB patient  being seen by mental health provider at this facility, and Other provider involved in patient's care AEB patient being seen by neurology, cardiology, and OB/GYN  Patient/Guardian was advised Release of Information must be obtained prior to any record release in order to collaborate their care with an outside provider. Patient/Guardian was advised if they have not already done so to contact the registration department to sign all necessary forms in order for us  to release information regarding their care.   Consent: Patient/Guardian gives verbal consent for treatment and assignment of benefits for services provided during this visit. Patient/Guardian expressed understanding and agreed to proceed.   1. Generalized anxiety disorder (Primary)  - clonazePAM  (KLONOPIN ) 2 MG tablet; Take 1 tablet (2 mg total) by mouth at bedtime as needed for anxiety.  Dispense: 30 tablet; Refill: 0 - venlafaxine  XR (EFFEXOR -XR) 75 MG 24 hr capsule; Take 1 capsule (75 mg total) by mouth daily with breakfast.  Dispense: 90 capsule; Refill: 1  2. Moderate episode of recurrent major  depressive disorder (HCC)  - venlafaxine  XR (EFFEXOR -XR) 75 MG 24 hr capsule; Take 1 capsule (75 mg total) by mouth daily with breakfast.  Dispense: 90 capsule; Refill: 1  Patient to follow-up in 2 months Provider spent a total of 15 minutes with the patient/reviewing patient's chart  Reginia FORBES Bolster, PA 01/14/2024, 4:00 PM

## 2024-02-05 DIAGNOSIS — Z419 Encounter for procedure for purposes other than remedying health state, unspecified: Secondary | ICD-10-CM | POA: Diagnosis not present

## 2024-02-27 ENCOUNTER — Encounter (HOSPITAL_COMMUNITY): Payer: Self-pay | Admitting: Physician Assistant

## 2024-02-27 ENCOUNTER — Ambulatory Visit (HOSPITAL_COMMUNITY): Admitting: Physician Assistant

## 2024-02-27 DIAGNOSIS — F331 Major depressive disorder, recurrent, moderate: Secondary | ICD-10-CM

## 2024-02-27 DIAGNOSIS — F411 Generalized anxiety disorder: Secondary | ICD-10-CM | POA: Diagnosis not present

## 2024-02-27 MED ORDER — VENLAFAXINE HCL ER 75 MG PO CP24: 75.0000 mg | ORAL_CAPSULE | Freq: Every day | ORAL | 0 refills | Status: DC

## 2024-02-27 MED ORDER — CLONAZEPAM 2 MG PO TABS: 2.0000 mg | ORAL_TABLET | Freq: Every evening | ORAL | 0 refills | Status: DC | PRN

## 2024-02-27 NOTE — Progress Notes (Signed)
 [facility] MD/PA/NP OP Progress Note  02/27/2024 3:00 PM Amanda White  MRN:  [id number]  Chief Complaint:  Chief Complaint  Patient presents with   Follow-up   Medication Refill   HPI:   Amanda White is a 58 year old female with a past psychiatric history significant for generalized anxiety disorder and major depressive disorder who presents to [facility] for follow-up and medication management.  Patient is currently being managed on the following psychiatric medication:  Venlafaxine  XR (Effexor  XR) 75 mg daily Clonazepam  2 mg at bedtime  Patient presents to the encounter stating that she has been taking her medications regularly and denies experiencing any adverse side effects.  Since the loss of her grandson, patient reports that she feels like she has a haze over her mind.  She continues to endorse stressors surrounding her grandson's passing.  She also reports that her family lost her husband food truck business.  Patient endorses feeling sad and lonely.  She also reports that her memory has not been good stating that she often does not know what day or time it is.  A PHQ-9 screen was performed with the patient scoring a 19.  A GAD-7 screen was also performed with the patient scoring a 20.  Patient reports that she continues to take her clonazepam  regularly and states that the medication has been helpful.  She believes that the medication may go into effect roughly 30 minutes after taking the medication.  She reports that her use of clonazepam  last throughout the night.  Patient denies experiencing any adverse side effects associated with her use of clonazepam .   Patient is alert and oriented x 4, calm, cooperative, and fully engaged in conversation during the encounter.  Patient endorses depressed mood.  Patient exhibits depressed mood with congruent affect.  Patient denies suicidal or homicidal ideations.  She further denies auditory or visual  hallucinations and does not appear to be responding to internal/external stimuli.  Patient endorses fair sleep and receives on average 6 to 8 hours of sleep per night.  Patient endorses fair appetite and eats on average 2 meals per day.  Patient denies alcohol dysfunction, tobacco use, or illicit drug use.  Visit Diagnosis:    ICD-10-CM   1. Generalized anxiety disorder  F41.1 clonazePAM  (KLONOPIN ) 2 MG tablet    venlafaxine  XR (EFFEXOR -XR) 75 MG 24 hr capsule    2. Moderate episode of recurrent major depressive disorder (HCC)  F33.1 venlafaxine  XR (EFFEXOR -XR) 75 MG 24 hr capsule      Past Psychiatric History:  Patient reports that she has a history of mental health and has been treated for the last 20 years.  She reports that a lot of her mental health extends from her husband.  Patient is diagnosed with generalized anxiety disorder that is being managed by her primary care provider   Patient denies a past history of hospitalization due to mental health   Patient denies a past history of suicide attempts Patient denies a past history of homicide attempts  Past Medical History:  Past Medical History:  Diagnosis Date   Abnormal cardiac CT angiography 10/12/2022   Small intermediate density pericardial effusion versus thickening. Consider further evaluation with echocardiography.   Anxiety    Bipolar 1 disorder (HCC)    Carpal tunnel syndrome 09/25/2022   R sided release 2022, all better now.   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   Depression    Depression    Dizziness 10/09/2022   Saw emergency room 10/03/22   GERD (gastroesophageal reflux disease)    History of weight loss 12/25/2022   Hypertension    LFT elevation 12/25/2022   Lab Results       Component    Value    Date/Time           ALT    28    12/25/2022 10:31 AM           ALT    99 (H)    09/25/2022 09:53 AM           ALT    22    06/22/2020 01:26 PM           ALT    28    05/19/2018 09:51 PM           ALT    13    01/03/2012 09:55 AM             Lab Results      Component    Value    Date/Time           AST    25    12/25/2022 10:31 AM           AST    40 (H)    0   Migraine    Pressure ulcer 11/28/2022   Subcutaneous nodule 09/25/2022   Unintentional weight loss 09/25/2022   November 28, 2022 interim history:   Denies any heartburn, prior abdomen/flank pain gone, just some right upper chest pain now, gastrointestinal did endoscopy and found ulcer but we don't have report.  Off advil . Weight stable. Continuing remeron          Wt Readings from Last 5 Encounters:  11/28/22  125 lb 6.4 oz (56.9 kg)  11/24/22  125 lb 6.4 oz (56.9 kg)  11/16/22  125 lb 11.2 oz (57 kg)  10/25/22     Past Surgical History:  Procedure Laterality Date   CARPAL TUNNEL RELEASE Right 04/18/2021   Procedure: RIGHT ENDOSCOPIC CARPAL TUNNEL RELEASE;  Surgeon: Sebastian Lenis, MD;  Location: [facility];  Service: Orthopedics;  Laterality: Right;  LENTH OF SURGERY: 30 MINUTES   DILATION AND CURETTAGE OF UTERUS     FOOT SURGERY  02/27/2023   --- BI PLANAR OSTEOTOMY 1ST MPJ RT, FIXATION    Family Psychiatric History:  Patient reports that her mother's side of the family struggles with mental health.  She reports that her mother's side of the family from [place] and states that many family members saw many of the atrocities during World War II.   Family history of suicide attempts: Patient denies Family history of homicide attempts: Patient denies Family history of substance abuse: Patient reports that her nephew abused heroin.  She reports that her brother uses marijuana frequently.  She reports that her father was an alcoholic  Family History:  Family History  Problem Relation Age of Onset    Hypertension Mother    Stroke Father    BRCA 1/2 Neg Hx    Breast cancer Neg Hx     Social History:  Social History   Socioeconomic History   Marital status: Married    Spouse name: Not on file  Number of children: Not on file   Years of education: Not on file   Highest education level: Bachelor's degree (e.g., BA, AB, BS)  Occupational History   Not on file  Tobacco Use   Smoking status: Never   Smokeless tobacco: Never  Substance and Sexual Activity   Alcohol use: No    Comment: occas.   Drug use: No   Sexual activity: Not on file  Other Topics Concern   Not on file  Social History Narrative   Not on file   Social Drivers of Health   Financial Resource Strain: Medium Risk (05/31/2023)   Overall Financial Resource Strain (CARDIA)    Difficulty of Paying Living Expenses: Somewhat hard  Food Insecurity: No Food Insecurity (05/31/2023)   Hunger Vital Sign    Worried About Running Out of Food in the Last Year: Never true    Ran Out of Food in the Last Year: Never true  Transportation Needs: No Transportation Needs (04/30/2023)   PRAPARE - Administrator, Civil Service (Medical): No    Lack of Transportation (Non-Medical): No  Physical Activity: Inactive (05/31/2023)   Exercise Vital Sign    Days of Exercise per Week: 0 days    Minutes of Exercise per Session: 0 min  Stress: Stress Concern Present (05/31/2023)   Amanda White    Feeling of Stress : Very much  Social Connections: Socially Isolated (05/31/2023)   Social Connection and Isolation Panel    Frequency of Communication with Friends and Family: Once a week    Frequency of Social Gatherings with Friends and Family: Never    Attends Religious Services: Never    Database administrator or Organizations: No    Attends Engineer, structural: Never    Marital Status: Married    Allergies: No Known Allergies  Metabolic Disorder  Labs: No results found for: HGBA1C, MPG No results found for: PROLACTIN Lab Results  Component Value Date   CHOL 179 05/02/2023   TRIG 93.0 05/02/2023   HDL 74.30 05/02/2023   CHOLHDL 2 05/02/2023   VLDL 18.6 05/02/2023   LDLCALC 86 05/02/2023   LDLCALC 124 (H) 09/25/2022   Lab Results  Component Value Date   TSH 1.31 11/21/2022   TSH 0.63 09/25/2022    Therapeutic Level Labs: No results found for: LITHIUM No results found for: VALPROATE No results found for: CBMZ  Current Medications: Current Outpatient Medications  Medication Sig Dispense Refill   acetaminophen  (TYLENOL ) 325 MG tablet Take 650 mg by mouth every 6 (six) hours as needed.     ciclopirox  (LOPROX ) 0.77 % cream Apply topically 2 (two) times daily. 90 g 2   [START ON 03/03/2024] clonazePAM  (KLONOPIN ) 2 MG tablet Take 1 tablet (2 mg total) by mouth at bedtime as needed for anxiety. 30 tablet 0   cyclobenzaprine  (FLEXERIL ) 10 MG tablet TAKE 1 TABLET BY MOUTH DAILY AS NEEDED FOR MUSCLE SPASMS. 30 tablet 2   diclofenac  Sodium (VOLTAREN ) 1 % GEL Apply 4 g topically 4 (four) times daily as needed. 100 g 3   famotidine -calcium  carbonate-magnesium  hydroxide (PEPCID  COMPLETE) 10-800-165 MG chewable tablet Chew 1 tablet by mouth daily as needed. 100 tablet 11   gabapentin  (NEURONTIN ) 300 MG capsule Take 1 capsule (300 mg total) by mouth 3 (three) times daily. 270 capsule 3   lisinopril -hydrochlorothiazide  (ZESTORETIC ) 10-12.5 MG tablet TAKE 1 TABLET BY MOUTH EVERY DAY 90 tablet 1   omeprazole (  PRILOSEC) 40 MG capsule Take 40 mg by mouth every morning.     ondansetron  (ZOFRAN -ODT) 4 MG disintegrating tablet Take 1 tablet (4 mg total) by mouth every 8 (eight) hours as needed for nausea or vomiting. 15 tablet 2   rosuvastatin  (CRESTOR ) 10 MG tablet TAKE 1 TABLET (10 MG TOTAL) BY MOUTH DAILY FOR 30 DAYS, THEN 2 TABLETS (20 MG TOTAL) DAILY. 180 tablet 1   sucralfate  (CARAFATE ) 1 g tablet Take 1 tablet (1 g total) by mouth  4 (four) times daily -  with meals and at bedtime. 120 tablet 0   venlafaxine  XR (EFFEXOR -XR) 75 MG 24 hr capsule Take 1 capsule (75 mg total) by mouth daily with breakfast. 90 capsule 0   No current facility-administered medications for this visit.     Musculoskeletal: Strength & Muscle Tone: within normal limits Gait & Station: normal Patient leans: N/A  Psychiatric Specialty Exam: Review of Systems  Psychiatric/Behavioral:  Positive for dysphoric mood. Negative for decreased concentration, hallucinations, self-injury, sleep disturbance and suicidal ideas. The patient is nervous/anxious. The patient is not hyperactive.     Blood pressure 129/82, pulse 84, height 5' 3 (1.6 m), weight 149 lb 9.6 oz (67.9 kg), last menstrual period 12/20/2011, SpO2 96%.Body mass index is 26.5 kg/m.  General Appearance: Casual  Eye Contact:  Good  Speech:  Clear and Coherent and Normal Rate  Volume:  Normal  Mood:  Anxious and Depressed  Affect:  Appropriate  Thought Process:  Coherent, Goal Directed, and Descriptions of Associations: Intact  Orientation:  Full (Time, Place, and Person)  Thought Content: WDL   Suicidal Thoughts:  No  Homicidal Thoughts:  No  Memory:  Immediate;   Good Recent;   Good Remote;   Good  Judgement:  Good  Insight:  Good  Psychomotor Activity:  Normal  Concentration:  Concentration: Good and Attention Span: Good  Recall:  Good  Fund of Knowledge: Good  Language: Good  Akathisia:  No  Handed:  Right  AIMS (if indicated): not done  Assets:  Communication Skills Desire for Improvement Housing Social Support Transportation Vocational/Educational  ADL's:  Intact  Cognition: WNL  Sleep:  Good   Screenings: GAD-7    Flowsheet Row Clinical Support from 02/27/2024 in [facility] Clinical Support from 01/14/2024 in [facility] Video Visit from 11/02/2023 in [facility] Clinical  Support from 09/12/2023 in [facility] Video Visit from 07/18/2023 in [facility]  Total GAD-7 Score 20 19 12 12 2    PHQ2-9    Flowsheet Row Clinical Support from 02/27/2024 in [facility] Clinical Support from 01/14/2024 in [facility] Video Visit from 11/02/2023 in [facility] Clinical Support from 09/12/2023 in [facility] Video Visit from 07/18/2023 in [facility]  PHQ-2 Total Score 6 6 4 3  0  PHQ-9 Total Score 19 19 10 11  --   Flowsheet Row Clinical Support from 02/27/2024 in [facility] Clinical Support from 01/14/2024 in [facility] Video Visit from 11/02/2023 in [facility]  C-SSRS RISK CATEGORY Low Risk No Risk No Risk     Assessment and Plan:   Amanda White is a 58 year old female with a past psychiatric history significant for generalized anxiety disorder and major depressive disorder who presents to [facility] for follow-up and medication management.  Patient presents to the encounter stating that she continues to take her medications regularly and denies experiencing any adverse side effects.  Patient continues to endorse depression attributed to her grandson's passing.  She also endorses generalized stressors.  A PHQ-9 screen was performed with the patient scoring a 19.  A GAD-7 screen was also performed with the patient scoring a 20.  Despite patient's ongoing depression and anxiety, patient would like to continue taking her medications as prescribed.  Patient's medications to be e-prescribed to pharmacy of choice.  Provider informed patient of the therapy services that this facility provides.  Patient acknowledged the information and would let provider know when  she is ready to be set up with therapy.  A [place] Suicide Severity Rating Scale was performed with the patient being considered low risk.  Patient denies suicidal ideations and is able to contract for safety at this time.   Collaboration of Care: Collaboration of Care: Medication Management AEB provider managing patient's psychiatric medications, Primary Care Provider AEB patient being seen by her primary care provider, Psychiatrist AEB patient being seen by mental health provider at this facility, and Other provider involved in patient's care AEB patient being seen by neurology, cardiology, and OB/GYN  Patient/Guardian was advised Release of Information must be obtained prior to any record release in order to collaborate their care with an outside provider. Patient/Guardian was advised if they have not already done so to contact the registration department to sign all necessary forms in order for us  to release information regarding their care.   Consent: Patient/Guardian gives verbal consent for treatment and assignment of benefits for services provided during this visit. Patient/Guardian expressed understanding and agreed to proceed.   1. Generalized anxiety disorder  - clonazePAM  (KLONOPIN ) 2 MG tablet; Take 1 tablet (2 mg total) by mouth at bedtime as needed for anxiety.  Dispense: 30 tablet; Refill: 0 - venlafaxine  XR (EFFEXOR -XR) 75 MG 24 hr capsule; Take 1 capsule (75 mg total) by mouth daily with breakfast.  Dispense: 90 capsule; Refill: 0  2. Moderate episode of recurrent major depressive disorder (HCC)  - venlafaxine  XR (EFFEXOR -XR) 75 MG 24 hr capsule; Take 1 capsule (75 mg total) by mouth daily with breakfast.  Dispense: 90 capsule; Refill: 0  Patient to follow-up in 5 weeks Provider spent a total of 24 minutes with the patient/reviewing patient's chart  Amanda FORBES Bolster, PA 02/27/2024, 3:00 PM

## 2024-03-07 DIAGNOSIS — Z419 Encounter for procedure for purposes other than remedying health state, unspecified: Secondary | ICD-10-CM | POA: Diagnosis not present

## 2024-03-27 ENCOUNTER — Other Ambulatory Visit (HOSPITAL_COMMUNITY): Payer: Self-pay | Admitting: Physician Assistant

## 2024-03-27 DIAGNOSIS — F411 Generalized anxiety disorder: Secondary | ICD-10-CM

## 2024-04-02 ENCOUNTER — Encounter (HOSPITAL_COMMUNITY): Payer: Self-pay | Admitting: Physician Assistant

## 2024-04-02 ENCOUNTER — Ambulatory Visit (HOSPITAL_COMMUNITY): Admitting: Physician Assistant

## 2024-04-02 DIAGNOSIS — F331 Major depressive disorder, recurrent, moderate: Secondary | ICD-10-CM

## 2024-04-02 DIAGNOSIS — F411 Generalized anxiety disorder: Secondary | ICD-10-CM | POA: Diagnosis not present

## 2024-04-02 MED ORDER — CLONAZEPAM 2 MG PO TABS: 2.0000 mg | ORAL_TABLET | Freq: Every evening | ORAL | 0 refills | Status: DC | PRN

## 2024-04-02 MED ORDER — VENLAFAXINE HCL ER 75 MG PO CP24: 75.0000 mg | ORAL_CAPSULE | Freq: Every day | ORAL | 0 refills | Status: DC

## 2024-04-02 NOTE — Progress Notes (Signed)
 [facility] MD/PA/NP OP Progress Note  04/02/2024 2:30 PM Amanda White  MRN:  [id number]  Chief Complaint:  Chief Complaint  Patient presents with   Follow-up   Medication Refill   HPI:   Amanda White is a 58 year old female with a past psychiatric history significant for generalized anxiety disorder and major depressive disorder who presents to [facility] for follow-up and medication management.  Patient is currently being managed on the following psychiatric medication:  Venlafaxine  XR (Effexor  XR) 75 mg daily Clonazepam  2 mg at bedtime  Patient reports that she is still deeply impacted by the loss of her grandson.  She also reports that her family has been deeply affected as well.  She reports that she sees her husband cry a lot due to the loss.  Patient reports that her son is not handling the loss of his son well and recently got into an argument with her and his father.  She reports that everywhere she turns in the house, she sees her grandson.  She reports that she is unable to get over the details surrounding the incident involving her grandson's passing.  She reports that things are not the same anymore.  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  She reports being really sad but denies suicidal ideations.  She reports that she continues to take her medications regularly without issue.  A PHQ-9 screening was performed with the patient scoring a 15.  A GAD-7 screen was also performed with the patient scoring a 14.  Patient is alert and oriented x 4, calm, cooperative, and fully engaged in conversation during the encounter.  Patient describes her mood as sad and irritated.  Patient exhibits depressed mood with congruent affect.  Patient denies suicidal or homicidal ideations.  She further denies auditory or visual hallucinations and does not appear to be responding to  internal/external stimuli.  Patient endorses fair sleep and receives on average 6 to 8 hours of intermittent sleep per night.  Patient reports that she often wakes up throughout the night.  Patient endorses fair appetite and eats on average 1-2 meals per Amanda.  Patient denies alcohol consumption, tobacco use, or illicit drug use.  Visit Diagnosis:    ICD-10-CM   1. Generalized anxiety disorder  F41.1 clonazePAM  (KLONOPIN ) 2 MG tablet    venlafaxine  XR (EFFEXOR -XR) 75 MG 24 hr capsule    2. Moderate episode of recurrent major depressive disorder (HCC)  F33.1 venlafaxine  XR (EFFEXOR -XR) 75 MG 24 hr capsule      Past Psychiatric History:  Patient reports that she has a history of mental health and has been treated for the last 20 years.  She reports that a lot of her mental health extends from her husband.  Patient is diagnosed with generalized anxiety disorder that is being managed by her primary care provider   Patient denies a past history of hospitalization due to mental health   Patient denies a past history of suicide attempts Patient denies a past history of homicide attempts  Past Medical History:  Past Medical History:  Diagnosis Date   Abnormal cardiac CT angiography 10/12/2022   Small intermediate density pericardial effusion versus thickening. Consider further evaluation with echocardiography.   Anxiety    Bipolar 1 disorder (HCC)    Carpal tunnel syndrome 09/25/2022   R sided release 2022, all better now.   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   Depression    Depression    Dizziness 10/09/2022   Saw emergency room 10/03/22   GERD (gastroesophageal reflux disease)    History of weight loss 12/25/2022   Hypertension    LFT elevation  12/25/2022   Lab Results      Component    Value    Date/Time           ALT    28    12/25/2022 10:31 AM           ALT    99 (H)    09/25/2022 09:53 AM           ALT    22    06/22/2020 01:26 PM           ALT    28    05/19/2018 09:51 PM           ALT    13    01/03/2012 09:55 AM             Lab Results      Component    Value    Date/Time           AST    25    12/25/2022 10:31 AM           AST    40 (H)    0   Migraine    Pressure ulcer 11/28/2022   Subcutaneous nodule 09/25/2022   Unintentional weight loss 09/25/2022   November 28, 2022 interim history:   Denies any heartburn, prior abdomen/flank pain gone, just some right upper chest pain now, gastrointestinal did endoscopy and found ulcer but we don't have report.  Off advil . Weight stable. Continuing remeron          Wt Readings from Last 5 Encounters:  11/28/22  125 lb 6.4 oz (56.9 kg)  11/24/22  125 lb 6.4 oz (56.9 kg)  11/16/22  125 lb 11.2 oz (57 kg)  10/25/22     Past Surgical History:  Procedure Laterality Date   CARPAL TUNNEL RELEASE Right 04/18/2021   Procedure: RIGHT ENDOSCOPIC CARPAL TUNNEL RELEASE;  Surgeon: Sebastian Lenis, MD;  Location: [facility];  Service: Orthopedics;  Laterality: Right;  LENTH OF SURGERY: 30 MINUTES   DILATION AND CURETTAGE OF UTERUS     FOOT SURGERY  02/27/2023   --- BI PLANAR OSTEOTOMY 1ST MPJ RT, FIXATION    Family Psychiatric History:  Patient reports that her mother's side of the family struggles with mental health.  She reports that her mother's side of the family from [place] and states that many family members saw many of the atrocities during World War II.   Family history of suicide attempts: Patient denies Family history of homicide attempts: Patient denies Family history of substance abuse: Patient reports that her nephew abused heroin.  She reports that her brother uses marijuana frequently.  She reports that her father was an alcoholic  Family History:  Family History  Problem  Relation Age of Onset   Hypertension Mother    Stroke Father    BRCA 1/2 Neg Hx    Breast cancer Neg Hx     Social History:  Social History   Socioeconomic History  Marital status: Married    Spouse name: Not on file   Number of children: Not on file   Years of education: Not on file   Highest education level: Bachelor's degree (e.g., BA, AB, BS)  Occupational History   Not on file  Tobacco Use   Smoking status: Never   Smokeless tobacco: Never  Substance and Sexual Activity   Alcohol use: No    Comment: occas.   Drug use: No   Sexual activity: Not on file  Other Topics Concern   Not on file  Social History Narrative   Not on file   Social Drivers of Health   Financial Resource Strain: Medium Risk (05/31/2023)   Overall Financial Resource Strain (CARDIA)    Difficulty of Paying Living Expenses: Somewhat hard  Food Insecurity: No Food Insecurity (05/31/2023)   Hunger Vital Sign    Worried About Running Out of Food in the Last Year: Never true    Ran Out of Food in the Last Year: Never true  Transportation Needs: No Transportation Needs (04/30/2023)   PRAPARE - Administrator, Civil Service (Medical): No    Lack of Transportation (Non-Medical): No  Physical Activity: Inactive (05/31/2023)   Exercise Vital Sign    Days of Exercise per Week: 0 days    Minutes of Exercise per Session: 0 min  Stress: Stress Concern Present (05/31/2023)   [employer] of Occupational Health - Occupational Stress Questionnaire    Feeling of Stress : Very much  Social Connections: Socially Isolated (05/31/2023)   Social Connection and Isolation Panel    Frequency of Communication with Friends and Family: Once a week    Frequency of Social Gatherings with Friends and Family: Never    Attends Religious Services: Never    Database administrator or Organizations: No    Attends Engineer, structural: Never    Marital Status: Married    Allergies: No Known  Allergies  Metabolic Disorder Labs: No results found for: HGBA1C, MPG No results found for: PROLACTIN Lab Results  Component Value Date   CHOL 179 05/02/2023   TRIG 93.0 05/02/2023   HDL 74.30 05/02/2023   CHOLHDL 2 05/02/2023   VLDL 18.6 05/02/2023   LDLCALC 86 05/02/2023   LDLCALC 124 (H) 09/25/2022   Lab Results  Component Value Date   TSH 1.31 11/21/2022   TSH 0.63 09/25/2022    Therapeutic Level Labs: No results found for: LITHIUM No results found for: VALPROATE No results found for: CBMZ  Current Medications: Current Outpatient Medications  Medication Sig Dispense Refill   acetaminophen  (TYLENOL ) 325 MG tablet Take 650 mg by mouth every 6 (six) hours as needed.     ciclopirox  (LOPROX ) 0.77 % cream Apply topically 2 (two) times daily. 90 g 2   clonazePAM  (KLONOPIN ) 2 MG tablet Take 1 tablet (2 mg total) by mouth at bedtime as needed for anxiety. 30 tablet 0   cyclobenzaprine  (FLEXERIL ) 10 MG tablet TAKE 1 TABLET BY MOUTH DAILY AS NEEDED FOR MUSCLE SPASMS. 30 tablet 2   diclofenac  Sodium (VOLTAREN ) 1 % GEL Apply 4 g topically 4 (four) times daily as needed. 100 g 3   famotidine -calcium  carbonate-magnesium  hydroxide (PEPCID  COMPLETE) 10-800-165 MG chewable tablet Chew 1 tablet by mouth daily as needed. 100 tablet 11   gabapentin  (NEURONTIN ) 300 MG capsule Take 1 capsule (300 mg total) by mouth 3 (three) times daily. 270 capsule 3   lisinopril -hydrochlorothiazide  (ZESTORETIC ) 10-12.5 MG tablet TAKE 1 TABLET  BY MOUTH EVERY Amanda 90 tablet 1   omeprazole (PRILOSEC) 40 MG capsule Take 40 mg by mouth every morning.     ondansetron  (ZOFRAN -ODT) 4 MG disintegrating tablet Take 1 tablet (4 mg total) by mouth every 8 (eight) hours as needed for nausea or vomiting. 15 tablet 2   rosuvastatin  (CRESTOR ) 10 MG tablet TAKE 1 TABLET (10 MG TOTAL) BY MOUTH DAILY FOR 30 DAYS, THEN 2 TABLETS (20 MG TOTAL) DAILY. 180 tablet 1   sucralfate  (CARAFATE ) 1 g tablet Take 1 tablet (1 g  total) by mouth 4 (four) times daily -  with meals and at bedtime. 120 tablet 0   venlafaxine  XR (EFFEXOR -XR) 75 MG 24 hr capsule Take 1 capsule (75 mg total) by mouth daily with breakfast. 90 capsule 0   No current facility-administered medications for this visit.     Musculoskeletal: Strength & Muscle Tone: within normal limits Gait & Station: normal Patient leans: N/A  Psychiatric Specialty Exam: Review of Systems  Psychiatric/Behavioral:  Positive for dysphoric mood and sleep disturbance. Negative for decreased concentration, hallucinations, self-injury and suicidal ideas. The patient is nervous/anxious. The patient is not hyperactive.     Blood pressure (!) 161/84, pulse 78, temperature 98.4 F (36.9 C), temperature source Oral, height 5' 3 (1.6 m), weight 147 lb (66.7 kg), last menstrual period 12/20/2011, SpO2 98%.Body mass index is 26.04 kg/m.  General Appearance: Casual  Eye Contact:  Good  Speech:  Clear and Coherent and Normal Rate  Volume:  Normal  Mood:  Anxious and Depressed  Affect:  Congruent  Thought Process:  Coherent, Goal Directed, and Descriptions of Associations: Intact  Orientation:  Full (Time, Place, and Person)  Thought Content: WDL   Suicidal Thoughts:  No  Homicidal Thoughts:  No  Memory:  Immediate;   Good Recent;   Good Remote;   Good  Judgement:  Good  Insight:  Good  Psychomotor Activity:  Normal  Concentration:  Concentration: Good and Attention Span: Good  Recall:  Good  Fund of Knowledge: Good  Language: Good  Akathisia:  No  Handed:  Right  AIMS (if indicated): not done  Assets:  Communication Skills Desire for Improvement Housing Social Support Transportation Vocational/Educational  ADL's:  Intact  Cognition: WNL  Sleep:  Fair   Screenings: GAD-7    Flowsheet Row Clinical Support from 04/02/2024 in [facility] Clinical Support from 02/27/2024 in [facility] Clinical  Support from 01/14/2024 in [facility] Video Visit from 11/02/2023 in [facility] Clinical Support from 09/12/2023 in [facility]  Total GAD-7 Score 14 20 19 12 12    PHQ2-9    Flowsheet Row Clinical Support from 04/02/2024 in [facility] Clinical Support from 02/27/2024 in [facility] Clinical Support from 01/14/2024 in [facility] Video Visit from 11/02/2023 in [facility] Clinical Support from 09/12/2023 in [facility]  PHQ-2 Total Score 4 6 6 4 3   PHQ-9 Total Score 15 19 19 10 11    Flowsheet Row Clinical Support from 04/02/2024 in [facility] Clinical Support from 02/27/2024 in [facility] Clinical Support from 01/14/2024 in [facility]  C-SSRS RISK CATEGORY Low Risk Low Risk No Risk     Assessment and Plan:   Amanda White is a 58 year old female with a past psychiatric  history significant for generalized anxiety disorder and major depressive disorder who presents to [facility] for follow-up and medication management.  Patient reports that she continues to take her medications regularly and denies experiencing any adverse side effects.  She reports that her use of clonazepam  has been helpful in managing her anxiety and that she struggles with.  Patient denies experiencing any adverse side effects from her use of clonazepam .  Patient continues to endorse worsening depression attributed to reeling over the loss of her grandson.  She reports that everyone in the family has been deeply impacted by the loss and states that it has been a struggle to move forward.  A PHQ-9 screen was performed with the patient scoring a 15.  A GAD-7 screen was also  performed with the patient scoring a 14.  Despite her ongoing depression and anxiety, patient reports that her medications have been helpful in managing her feelings when they are at their worst.  Patient would like to continue taking her medications as prescribed.  Patient medications to be e-prescribed to pharmacy of choice.  A [place] Suicide Severity Rating Scale was performed with the patient being considered low risk.  Patient denies suicidal ideations and is able to contract for safety at this time.   Collaboration of Care: Collaboration of Care: Medication Management AEB provider managing patient's psychiatric medications, Primary Care Provider AEB patient being seen by her primary care provider, Psychiatrist AEB patient being seen by mental health provider at this facility, and Other provider involved in patient's care AEB patient being seen by neurology, cardiology, and OB/GYN  Patient/Guardian was advised Release of Information must be obtained prior to any record release in order to collaborate their care with an outside provider. Patient/Guardian was advised if they have not already done so to contact the registration department to sign all necessary forms in order for us  to release information regarding their care.   Consent: Patient/Guardian gives verbal consent for treatment and assignment of benefits for services provided during this visit. Patient/Guardian expressed understanding and agreed to proceed.   1. Generalized anxiety disorder  - clonazePAM  (KLONOPIN ) 2 MG tablet; Take 1 tablet (2 mg total) by mouth at bedtime as needed for anxiety.  Dispense: 30 tablet; Refill: 0 - venlafaxine  XR (EFFEXOR -XR) 75 MG 24 hr capsule; Take 1 capsule (75 mg total) by mouth daily with breakfast.  Dispense: 90 capsule; Refill: 0  2. Moderate episode of recurrent major depressive disorder (HCC)  - venlafaxine  XR (EFFEXOR -XR) 75 MG 24 hr capsule; Take 1 capsule (75 mg total) by mouth daily with  breakfast.  Dispense: 90 capsule; Refill: 0  Patient to follow-up in 6 weeks Provider spent a total of 25 minutes with the patient/reviewing patient's chart  Reginia FORBES Bolster, PA 04/02/2024, 2:30 PM

## 2024-04-06 DIAGNOSIS — Z419 Encounter for procedure for purposes other than remedying health state, unspecified: Secondary | ICD-10-CM | POA: Diagnosis not present

## 2024-05-02 ENCOUNTER — Other Ambulatory Visit: Payer: Self-pay | Admitting: Internal Medicine

## 2024-05-02 DIAGNOSIS — R5383 Other fatigue: Secondary | ICD-10-CM

## 2024-05-02 DIAGNOSIS — M5440 Lumbago with sciatica, unspecified side: Secondary | ICD-10-CM

## 2024-05-07 DIAGNOSIS — K709 Alcoholic liver disease, unspecified: Secondary | ICD-10-CM | POA: Diagnosis not present

## 2024-05-07 DIAGNOSIS — R7401 Elevation of levels of liver transaminase levels: Secondary | ICD-10-CM | POA: Diagnosis not present

## 2024-05-07 DIAGNOSIS — K219 Gastro-esophageal reflux disease without esophagitis: Secondary | ICD-10-CM | POA: Diagnosis not present

## 2024-05-14 ENCOUNTER — Ambulatory Visit (INDEPENDENT_AMBULATORY_CARE_PROVIDER_SITE_OTHER): Admitting: Physician Assistant

## 2024-05-14 ENCOUNTER — Encounter (HOSPITAL_COMMUNITY): Payer: Self-pay | Admitting: Physician Assistant

## 2024-05-14 DIAGNOSIS — F331 Major depressive disorder, recurrent, moderate: Secondary | ICD-10-CM | POA: Diagnosis not present

## 2024-05-14 DIAGNOSIS — F411 Generalized anxiety disorder: Secondary | ICD-10-CM | POA: Diagnosis not present

## 2024-05-14 MED ORDER — CLONAZEPAM 2 MG PO TABS: 2.0000 mg | ORAL_TABLET | Freq: Every evening | ORAL | 0 refills | Status: DC | PRN

## 2024-05-14 MED ORDER — VENLAFAXINE HCL ER 75 MG PO CP24: 75.0000 mg | ORAL_CAPSULE | Freq: Every day | ORAL | 0 refills | Status: DC

## 2024-05-14 NOTE — Progress Notes (Signed)
 [facility] MD/PA/NP OP Progress Note  05/14/2024 3:30 PM Amanda White  MRN:  [id number]  Chief Complaint:  Chief Complaint  Patient presents with   Follow-up   Medication Refill   HPI:   Amanda White is a 58 year old female with a past psychiatric history significant for generalized anxiety disorder and major depressive disorder who presents to [facility] for follow-up and medication management.  Patient is currently being managed on the following psychiatric medication:  Venlafaxine  XR (Effexor  XR) 75 mg daily Clonazepam  2 mg at bedtime  Patient presents to the encounter stating that she has been taking her medications regularly and denies experiencing any adverse side effects.  Patient reports that her use of clonazepam  has been helpful in managing her sleep.  She reports that when she takes the medication, she is unaware of how long it takes for the medication to go into effect.  Patient denies experiencing any adverse side effects associated with her use of clonazepam .  Patient reports that she has been staying busy and keeping herself preoccupied.  She reports that she is still processing over the death of her grandson but states that keeping her mind preoccupied with something to do helps her to manage her emotions.  She reports that she has not been seeing eye to eye with her son.  She feels that he is mad at her, but he often denies it.  She believes that her son is deeply impacted by the passing of his son (her grandson).  Though patient expresses that she still continues to grieve, patient would like to continue taking her medications as prescribed.  In addition to taking her medications, patient reports that she has been confiding in people that she is able to talk to such as her friends or her pastor.  She reports that she has also started looking for work.  A PHQ-9 screen was performed with the patient scoring an 11.  A GAD-7 screen was  also performed and the patient scoring a 12.  Patient is alert and oriented x 4, calm, cooperative, and fully engaged in conversation during the encounter.  Patient reports that she has not been herself lately and describes her mood as solemn.  She reports that she is neither happy nor is she sad.  Patient exhibits depressed mood with appropriate affect.  Patient denies suicidal or homicidal ideations.  She further denies auditory or visual hallucinations and does not appear to be responding to internal/external stimuli.  Patient endorses good sleep and receives on average 6 to 7 hours of sleep per night.  Patient endorses good appetite and eats on average 2 meals per day.  Patient denies alcohol consumption, tobacco use, or illicit drug use.  Visit Diagnosis:    ICD-10-CM   1. Generalized anxiety disorder  F41.1 clonazePAM  (KLONOPIN ) 2 MG tablet    venlafaxine  XR (EFFEXOR -XR) 75 MG 24 hr capsule    2. Moderate episode of recurrent major depressive disorder (HCC)  F33.1 venlafaxine  XR (EFFEXOR -XR) 75 MG 24 hr capsule      Past Psychiatric History:  Patient reports that she has a history of mental health and has been treated for the last 20 years.  She reports that a lot of her mental health extends from her husband.  Patient is diagnosed with generalized anxiety disorder that is being managed by her primary care provider   Patient denies a past history of hospitalization due to mental health   Patient denies a past  history of suicide attempts Patient denies a past history of homicide attempts  Past Medical History:  Past Medical History:  Diagnosis Date   Abnormal cardiac CT angiography 10/12/2022   Small intermediate density pericardial effusion versus thickening. Consider further evaluation with echocardiography.   Anxiety    Bipolar 1 disorder (HCC)    Carpal tunnel syndrome 09/25/2022   R sided release 2022, all better now.   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   Depression    Depression    Dizziness 10/09/2022   Saw emergency room 10/03/22   GERD (gastroesophageal reflux disease)    History of weight loss 12/25/2022   Hypertension    LFT elevation 12/25/2022   Lab Results      Component    Value    Date/Time           ALT    28    12/25/2022 10:31 AM           ALT    99 (H)    09/25/2022 09:53 AM           ALT    22    06/22/2020 01:26 PM           ALT    28    05/19/2018 09:51 PM           ALT    13    01/03/2012 09:55 AM             Lab Results      Component    Value    Date/Time           AST    25    12/25/2022 10:31 AM           AST    40 (H)    0   Migraine    Pressure ulcer 11/28/2022   Subcutaneous nodule 09/25/2022   Unintentional weight loss 09/25/2022   November 28, 2022 interim history:   Denies any heartburn, prior abdomen/flank pain gone, just some right upper chest pain now, gastrointestinal did endoscopy and found ulcer but we don't have report.  Off advil . Weight stable. Continuing remeron          Wt Readings from Last 5 Encounters:  11/28/22  125 lb 6.4 oz (56.9 kg)  11/24/22  125 lb 6.4 oz (56.9 kg)  11/16/22  125 lb 11.2 oz (57 kg)  10/25/22     Past Surgical History:  Procedure Laterality Date   CARPAL TUNNEL RELEASE Right 04/18/2021   Procedure: RIGHT ENDOSCOPIC CARPAL TUNNEL RELEASE;  Surgeon: Sebastian Lenis, MD;  Location: [facility];  Service: Orthopedics;  Laterality: Right;  LENTH OF SURGERY: 30 MINUTES   DILATION AND CURETTAGE OF UTERUS     FOOT SURGERY  02/27/2023   --- BI PLANAR OSTEOTOMY 1ST MPJ RT, FIXATION    Family Psychiatric History:  Patient reports that her mother's side of the family struggles with mental health.  She reports that her mother's side of the family from [place] and  states that many family members saw many of the atrocities during World War II.   Family history of suicide attempts: Patient denies Family history of homicide attempts: Patient denies Family history of  substance abuse: Patient reports that her nephew abused heroin.  She reports that her brother uses marijuana frequently.  She reports that her father was an alcoholic  Family History:  Family History  Problem Relation Age of Onset   Hypertension Mother    Stroke Father    BRCA 1/2 Neg Hx    Breast cancer Neg Hx     Social History:  Social History   Socioeconomic History   Marital status: Married    Spouse name: Not on file   Number of children: Not on file   Years of education: Not on file   Highest education level: Bachelor's degree (e.g., BA, AB, BS)  Occupational History   Not on file  Tobacco Use   Smoking status: Never   Smokeless tobacco: Never  Substance and Sexual Activity   Alcohol use: No    Comment: occas.   Drug use: No   Sexual activity: Not on file  Other Topics Concern   Not on file  Social History Narrative   Not on file   Social Drivers of Health   Financial Resource Strain: Medium Risk (05/31/2023)   Overall Financial Resource Strain (CARDIA)    Difficulty of Paying Living Expenses: Somewhat hard  Food Insecurity: No Food Insecurity (05/31/2023)   Hunger Vital Sign    Worried About Running Out of Food in the Last Year: Never true    Ran Out of Food in the Last Year: Never true  Transportation Needs: No Transportation Needs (04/30/2023)   PRAPARE - Administrator, Civil Service (Medical): No    Lack of Transportation (Non-Medical): No  Physical Activity: Inactive (05/31/2023)   Exercise Vital Sign    Days of Exercise per Week: 0 days    Minutes of Exercise per Session: 0 min  Stress: Stress Concern Present (05/31/2023)   [employer] of Occupational Health - Occupational Stress Questionnaire    Feeling of Stress : Very much  Social  Connections: Socially Isolated (05/31/2023)   Social Connection and Isolation Panel    Frequency of Communication with Friends and Family: Once a week    Frequency of Social Gatherings with Friends and Family: Never    Attends Religious Services: Never    Database Administrator or Organizations: No    Attends Engineer, Structural: Never    Marital Status: Married    Allergies: No Known Allergies  Metabolic Disorder Labs: No results found for: HGBA1C, MPG No results found for: PROLACTIN Lab Results  Component Value Date   CHOL 179 05/02/2023   TRIG 93.0 05/02/2023   HDL 74.30 05/02/2023   CHOLHDL 2 05/02/2023   VLDL 18.6 05/02/2023   LDLCALC 86 05/02/2023   LDLCALC 124 (H) 09/25/2022   Lab Results  Component Value Date   TSH 1.31 11/21/2022   TSH 0.63 09/25/2022    Therapeutic Level Labs: No results found for: LITHIUM No results found for: VALPROATE No results found for: CBMZ  Current Medications: Current Outpatient Medications  Medication Sig Dispense Refill   acetaminophen  (TYLENOL ) 325 MG tablet Take 650 mg by mouth every 6 (six) hours as needed.     ciclopirox  (LOPROX ) 0.77 % cream Apply topically 2 (two) times daily. 90 g 2   clonazePAM  (KLONOPIN ) 2 MG tablet Take 1 tablet (2 mg total) by mouth at bedtime as needed for anxiety. 30 tablet 0   cyclobenzaprine  (FLEXERIL ) 10 MG tablet TAKE 1 TABLET BY MOUTH DAILY AS NEEDED FOR MUSCLE SPASMS. 30  tablet 2   diclofenac  Sodium (VOLTAREN ) 1 % GEL Apply 4 g topically 4 (four) times daily as needed. 100 g 3   famotidine -calcium  carbonate-magnesium  hydroxide (PEPCID  COMPLETE) 10-800-165 MG chewable tablet Chew 1 tablet by mouth daily as needed. 100 tablet 11   gabapentin  (NEURONTIN ) 300 MG capsule Take 1 capsule (300 mg total) by mouth 3 (three) times daily. 270 capsule 3   lisinopril -hydrochlorothiazide  (ZESTORETIC ) 10-12.5 MG tablet TAKE 1 TABLET BY MOUTH EVERY DAY 90 tablet 1   omeprazole (PRILOSEC) 40 MG  capsule Take 40 mg by mouth every morning.     ondansetron  (ZOFRAN -ODT) 4 MG disintegrating tablet Take 1 tablet (4 mg total) by mouth every 8 (eight) hours as needed for nausea or vomiting. 15 tablet 2   rosuvastatin  (CRESTOR ) 10 MG tablet TAKE 1 TABLET (10 MG TOTAL) BY MOUTH DAILY FOR 30 DAYS, THEN 2 TABLETS (20 MG TOTAL) DAILY. 180 tablet 1   sucralfate  (CARAFATE ) 1 g tablet Take 1 tablet (1 g total) by mouth 4 (four) times daily -  with meals and at bedtime. 120 tablet 0   venlafaxine  XR (EFFEXOR -XR) 75 MG 24 hr capsule Take 1 capsule (75 mg total) by mouth daily with breakfast. 90 capsule 0   No current facility-administered medications for this visit.     Musculoskeletal: Strength & Muscle Tone: within normal limits Gait & Station: normal Patient leans: N/A  Psychiatric Specialty Exam: Review of Systems  Psychiatric/Behavioral:  Positive for dysphoric mood and sleep disturbance. Negative for decreased concentration, hallucinations, self-injury and suicidal ideas. The patient is nervous/anxious. The patient is not hyperactive.     Blood pressure 134/71, pulse 93, temperature 98.4 F (36.9 C), temperature source Oral, height 5' 3 (1.6 m), weight 146 lb 9.6 oz (66.5 kg), last menstrual period 12/20/2011, SpO2 99%.Body mass index is 25.97 kg/m.  General Appearance: Casual  Eye Contact:  Good  Speech:  Clear and Coherent and Normal Rate  Volume:  Normal  Mood:  Anxious and Depressed  Affect:  Congruent  Thought Process:  Coherent, Goal Directed, and Descriptions of Associations: Intact  Orientation:  Full (Time, Place, and Person)  Thought Content: WDL   Suicidal Thoughts:  No  Homicidal Thoughts:  No  Memory:  Immediate;   Good Recent;   Good Remote;   Good  Judgement:  Good  Insight:  Good  Psychomotor Activity:  Normal  Concentration:  Concentration: Good and Attention Span: Good  Recall:  Good  Fund of Knowledge: Good  Language: Good  Akathisia:  No  Handed:  Right   AIMS (if indicated): not done  Assets:  Communication Skills Desire for Improvement Housing Social Support Transportation Vocational/Educational  ADL's:  Intact  Cognition: WNL  Sleep:  Fair   Screenings: GAD-7    Flowsheet Row Clinical Support from 05/14/2024 in [facility] Clinical Support from 04/02/2024 in [facility] Clinical Support from 02/27/2024 in [facility] Clinical Support from 01/14/2024 in [facility] Video Visit from 11/02/2023 in [facility]  Total GAD-7 Score 12 14 20 19 12    PHQ2-9    Flowsheet Row Clinical Support from 05/14/2024 in [facility] Clinical Support from 04/02/2024 in [facility] Clinical Support from 02/27/2024 in [facility] Clinical Support from 01/14/2024 in [facility] Video Visit from 11/02/2023 in [place]  PHQ-2 Total Score  2 4 6 6 4   PHQ-9 Total Score 11 15 19 19 10    Flowsheet Row Clinical Support from 05/14/2024 in [facility] Clinical Support from 04/02/2024 in [facility] Clinical Support from 02/27/2024 in [facility]  C-SSRS RISK CATEGORY No Risk Low Risk Low Risk     Assessment and Plan:   Amanda White is a 58 year old female with a past psychiatric history significant for generalized anxiety disorder and major depressive disorder who presents to [facility] for follow-up and medication management.  Patient presents to the encounter stating that she continues to take her medications regularly and denies experiencing any adverse side effects.  This includes her use of clonazepam  which she says is effective in managing her  sleep.  Patient reports that she still continues to grieve over the passing of her grandson; however, she reports that she has been trying to stay busy and keep herself preoccupied so that she is able to get through the day.  A PHQ-9 screen was performed with the patient scoring an 11.  A GAD-7 screen was also performed with the patient scoring a 12.  Though patient continues to grieve, patient would like to continue taking her medications as prescribed.  Patient's medications to be e-prescribed to pharmacy of choice.  A [place] Suicide Severity Rating Scale was performed with the patient being considered low risk.  Patient denies suicidal ideations and is able to contract for safety at this time.   Collaboration of Care: Collaboration of Care: Medication Management AEB provider managing patient's psychiatric medications, Primary Care Provider AEB patient being seen by her primary care provider, Psychiatrist AEB patient being seen by mental health provider at this facility, and Other provider involved in patient's care AEB patient being seen by neurology, cardiology, and OB/GYN  Patient/Guardian was advised Release of Information must be obtained prior to any record release in order to collaborate their care with an outside provider. Patient/Guardian was advised if they have not already done so to contact the registration department to sign all necessary forms in order for us  to release information regarding their care.   Consent: Patient/Guardian gives verbal consent for treatment and assignment of benefits for services provided during this visit. Patient/Guardian expressed understanding and agreed to proceed.   1. Generalized anxiety disorder  - clonazePAM  (KLONOPIN ) 2 MG tablet; Take 1 tablet (2 mg total) by mouth at bedtime as needed for anxiety.  Dispense: 30 tablet; Refill: 0 - venlafaxine  XR (EFFEXOR -XR) 75 MG 24 hr capsule; Take 1 capsule (75 mg total) by mouth daily with breakfast.   Dispense: 90 capsule; Refill: 0  2. Moderate episode of recurrent major depressive disorder (HCC)  - venlafaxine  XR (EFFEXOR -XR) 75 MG 24 hr capsule; Take 1 capsule (75 mg total) by mouth daily with breakfast.  Dispense: 90 capsule; Refill: 0  Patient to follow-up in 7 weeks Provider spent a total of 23 minutes with the patient/reviewing patient's chart  Amanda FORBES Bolster, PA 05/14/2024, 3:30 PM

## 2024-06-20 ENCOUNTER — Other Ambulatory Visit (HOSPITAL_COMMUNITY): Payer: Self-pay | Admitting: Physician Assistant

## 2024-06-20 DIAGNOSIS — F411 Generalized anxiety disorder: Secondary | ICD-10-CM

## 2024-06-23 ENCOUNTER — Telehealth (HOSPITAL_COMMUNITY): Payer: Self-pay

## 2024-06-23 NOTE — Telephone Encounter (Signed)
 lonazePAM (KLONOPIN ) 2 MG tablet 2 mg, At bedtime PRN         Summary:Take 1 tablet (2 mg total) by mouth at bedtime as needed for anxiety., Starting Wed 05/14/2024, Normal Dose, Route, Frequency:2 mg, Oral, At bedtime PRNStart:11/19/2025Ordered On:11/19/2025Pharmacy:[id number] [address], [place] [address] Associated:Taking:Long-term:Med Note:       Change Directions:Take 1 tablet (2 mg total) by mouth at bedtime as needed for anxiety. Ordering Department:[facility] Authorized Ab:Wtnxn, Uchenna E, PA Dispense:30 tablet Refills:0 ordered Note to Pharmacy:Not to exceed 5 additional fills before 06/01/2024 DX Code Needed NEED REFILL. Dose History

## 2024-06-24 ENCOUNTER — Ambulatory Visit: Payer: Self-pay

## 2024-06-24 NOTE — Telephone Encounter (Signed)
 Message acknowledged and reviewed.

## 2024-06-24 NOTE — Telephone Encounter (Signed)
 Pt scheduled with PCP  06/30/24

## 2024-06-24 NOTE — Telephone Encounter (Signed)
 FYI Only or Action Required?: FYI only for provider: appointment scheduled on 06/30/24.  Patient was last seen in primary care on 10/17/2023 by Job Lukes, PA.  Called Nurse Triage reporting Abdominal Pain.  Symptoms began ongoing.  Interventions attempted: Prescription medications: Omeprazole.  Symptoms are: unchanged.  Triage Disposition: See HCP Within 4 Hours (Or PCP Triage)  Patient/caregiver understands and will follow disposition?:   Reason for Disposition  Abdominal pain is a chronic symptom (recurrent or ongoing AND present > 4 weeks)  Answer Assessment - Initial Assessment Questions Pt calling to report a burning sensation in stomach. Pt has hx of stomach ulcers and is on omeprazole. Pt states she has anxiety and panic attacks d/t grandson passing away. Pt thinks the anxiety and grief are taking a told on her physical wellbeing.   1. LOCATION: Where does it hurt?      Stomach  2. RADIATION: Does the pain shoot anywhere else? (e.g., chest, back)     N/a  3. ONSET: When did the pain begin? (e.g., minutes, hours or days ago)      Been going on for some time now  4. SUDDEN: Gradual or sudden onset?     Ongoing  5. PATTERN Does the pain come and go, or is it constant?     Comes and goes 6. SEVERITY: How bad is the pain?  (e.g., Scale 1-10; mild, moderate, or severe)     3/10 7. RECURRENT SYMPTOM: Have you ever had this type of stomach pain before? If Yes, ask: When was the last time? and What happened that time?      Yes  8. AGGRAVATING FACTORS: Does anything seem to cause this pain? (e.g., foods, stress, alcohol)     Pt unaware  9. CARDIAC SYMPTOMS: Do you have any of the following symptoms: chest pain, difficulty breathing, sweating, nausea?     Yes  10. OTHER SYMPTOMS: Do you have any other symptoms? (e.g., back pain, diarrhea, fever, urination pain, vomiting)       Denies  Protocols used: Abdominal Pain - Upper-A-AH  Copied from CRM  #[id number]. Topic: Clinical - Red Word Triage >> Jun 24, 2024 10:47 AM Eva FALCON wrote: Red Word that prompted transfer to Nurse Triage: feels irritation in upper stomach pain, not feeling well, dealing with grief, depression and anxiety is not good. Lost grandson back in July.

## 2024-06-30 ENCOUNTER — Encounter: Payer: Self-pay | Admitting: Internal Medicine

## 2024-06-30 ENCOUNTER — Ambulatory Visit: Admitting: Internal Medicine

## 2024-06-30 ENCOUNTER — Ambulatory Visit

## 2024-06-30 ENCOUNTER — Ambulatory Visit: Payer: Self-pay | Admitting: Internal Medicine

## 2024-06-30 VITALS — BP 130/70 | HR 97 | Temp 98.0°F | Ht 63.0 in | Wt 148.7 lb

## 2024-06-30 DIAGNOSIS — G479 Sleep disorder, unspecified: Secondary | ICD-10-CM | POA: Diagnosis not present

## 2024-06-30 DIAGNOSIS — E7800 Pure hypercholesterolemia, unspecified: Secondary | ICD-10-CM | POA: Diagnosis not present

## 2024-06-30 DIAGNOSIS — I10 Essential (primary) hypertension: Secondary | ICD-10-CM | POA: Diagnosis not present

## 2024-06-30 DIAGNOSIS — F4310 Post-traumatic stress disorder, unspecified: Secondary | ICD-10-CM | POA: Insufficient documentation

## 2024-06-30 DIAGNOSIS — R5383 Other fatigue: Secondary | ICD-10-CM

## 2024-06-30 DIAGNOSIS — R110 Nausea: Secondary | ICD-10-CM | POA: Diagnosis not present

## 2024-06-30 DIAGNOSIS — I700 Atherosclerosis of aorta: Secondary | ICD-10-CM

## 2024-06-30 DIAGNOSIS — K279 Peptic ulcer, site unspecified, unspecified as acute or chronic, without hemorrhage or perforation: Secondary | ICD-10-CM

## 2024-06-30 DIAGNOSIS — M5440 Lumbago with sciatica, unspecified side: Secondary | ICD-10-CM

## 2024-06-30 LAB — COMPREHENSIVE METABOLIC PANEL WITH GFR
ALT: 19 U/L (ref 3–35)
AST: 25 U/L (ref 5–37)
Albumin: 4.8 g/dL (ref 3.5–5.2)
Alkaline Phosphatase: 44 U/L (ref 39–117)
BUN: 20 mg/dL (ref 6–23)
CO2: 34 meq/L — ABNORMAL HIGH (ref 19–32)
Calcium: 9.5 mg/dL (ref 8.4–10.5)
Chloride: 97 meq/L (ref 96–112)
Creatinine, Ser: 0.68 mg/dL (ref 0.40–1.20)
GFR: 95.24 mL/min
Glucose, Bld: 91 mg/dL (ref 70–99)
Potassium: 3.9 meq/L (ref 3.5–5.1)
Sodium: 137 meq/L (ref 135–145)
Total Bilirubin: 0.6 mg/dL (ref 0.2–1.2)
Total Protein: 7.3 g/dL (ref 6.0–8.3)

## 2024-06-30 LAB — LIPID PANEL
Cholesterol: 179 mg/dL (ref 28–200)
HDL: 82.7 mg/dL
LDL Cholesterol: 72 mg/dL (ref 10–99)
NonHDL: 96.66
Total CHOL/HDL Ratio: 2
Triglycerides: 124 mg/dL (ref 10.0–149.0)
VLDL: 24.8 mg/dL (ref 0.0–40.0)

## 2024-06-30 LAB — CBC WITH DIFFERENTIAL/PLATELET
Basophils Absolute: 0.1 K/uL (ref 0.0–0.1)
Basophils Relative: 0.9 % (ref 0.0–3.0)
Eosinophils Absolute: 0.1 K/uL (ref 0.0–0.7)
Eosinophils Relative: 1.1 % (ref 0.0–5.0)
HCT: 35.2 % — ABNORMAL LOW (ref 36.0–46.0)
Hemoglobin: 12.2 g/dL (ref 12.0–15.0)
Lymphocytes Relative: 25.1 % (ref 12.0–46.0)
Lymphs Abs: 1.6 K/uL (ref 0.7–4.0)
MCHC: 34.8 g/dL (ref 30.0–36.0)
MCV: 94.1 fl (ref 78.0–100.0)
Monocytes Absolute: 0.5 K/uL (ref 0.1–1.0)
Monocytes Relative: 8.4 % (ref 3.0–12.0)
Neutro Abs: 4 K/uL (ref 1.4–7.7)
Neutrophils Relative %: 64.5 % (ref 43.0–77.0)
Platelets: 373 K/uL (ref 150.0–400.0)
RBC: 3.74 Mil/uL — ABNORMAL LOW (ref 3.87–5.11)
RDW: 13.3 % (ref 11.5–15.5)
WBC: 6.3 K/uL (ref 4.0–10.5)

## 2024-06-30 LAB — SEDIMENTATION RATE: Sed Rate: 3 mm/h (ref 0–30)

## 2024-06-30 MED ORDER — ROSUVASTATIN CALCIUM 10 MG PO TABS: ORAL_TABLET | ORAL | 1 refills | Status: AC

## 2024-06-30 MED ORDER — LISINOPRIL-HYDROCHLOROTHIAZIDE 10-12.5 MG PO TABS: 1.0000 | ORAL_TABLET | Freq: Every day | ORAL | 1 refills | Status: AC

## 2024-06-30 MED ORDER — GABAPENTIN 300 MG PO CAPS: 300.0000 mg | ORAL_CAPSULE | Freq: Three times a day (TID) | ORAL | 3 refills | Status: AC

## 2024-06-30 MED ORDER — CYCLOBENZAPRINE HCL 10 MG PO TABS: 10.0000 mg | ORAL_TABLET | Freq: Two times a day (BID) | ORAL | 2 refills | Status: AC

## 2024-06-30 MED ORDER — ONDANSETRON 4 MG PO TBDP: 4.0000 mg | ORAL_TABLET | Freq: Three times a day (TID) | ORAL | 2 refills | Status: AC | PRN

## 2024-06-30 NOTE — Assessment & Plan Note (Signed)
 Managed with omeprazole and sucralfate  as needed. Advised against aspirin due to gastrointestinal history. Omeprazole 40 mg will continue twice daily. Sucralfate  will be used as needed for ulcer pain.

## 2024-06-30 NOTE — Patient Instructions (Signed)
 It was a pleasure seeing you today! Your health and satisfaction are our top priorities.  Amanda Cone, MD  VISIT SUMMARY: Today, we discussed your ongoing issues with abdominal and back pain, as well as your mental health and sleep concerns. We reviewed your current medications and made some recommendations for further management and follow-up.  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 We will continue your current medications, gabapentin  and cyclobenzaprine , and refer you to physical therapy. A new lumbar spine x-ray has been ordered to check the status of your condition. If physical therapy does not help, we may consider an MRI and a surgical consultation.  -POST-TRAUMATIC STRESS DISORDER (PTSD): PTSD is a mental health condition triggered by a traumatic event, causing emotional distress and impacting daily life. You will continue taking clonazepam  and venlafaxine . We have referred you to a therapist for counseling to help manage your symptoms.  -SLEEP DISORDER, RULE OUT SLEEP APNEA: Sleep apnea is a condition where breathing repeatedly stops and starts during sleep, which can affect memory and cause sleep disturbances. We have ordered a sleep study to determine if you have sleep apnea.  -AORTIC ATHEROSCLEROSIS: This is the buildup of fats and cholesterol in the artery walls, which can restrict blood flow. You will continue taking rosuvastatin  to manage your cholesterol levels.  -PRIMARY HYPERTENSION: Hypertension is high blood pressure, which can lead to serious health issues if not managed. Your blood pressure is well-controlled with lisinopril -hydrochlorothiazide , which you will continue taking.  -PURE HYPERCHOLESTEROLEMIA: This is high cholesterol, which can increase the risk of heart disease. You will continue taking rosuvastatin  to manage your  cholesterol levels.  -PEPTIC ULCER DISEASE: This condition involves sores in the lining of your stomach, causing pain. You will continue taking omeprazole twice daily and use sucralfate  as needed for pain. Avoid aspirin due to your gastrointestinal history.  -GENERAL HEALTH MAINTENANCE: We discussed the importance of routine health maintenance, including blood work to assess your thyroid  function and overall health. Blood work has been ordered.  INSTRUCTIONS: Please follow up with physical therapy for your back pain and attend the sleep study as scheduled. Continue taking your medications as prescribed. We will review the results of your blood work and sleep study at your next appointment. If you have any new or worsening symptoms, please contact our office.  Your Providers PCP: Amanda White MATSU, MD,  [phone]) Referring Provider: Cone Amanda MATSU, MD,  [phone]) Care Team Provider: Dasie Leonor CROME, MD,  [phone]) Care Team Provider: Letha Savant, [place],  [phone]) Care Team Provider: Dartha Ernst, MD,  [phone]) Care Team Provider: Collene Mac FERNS, NP,  [phone]) Care Team Provider: Lonni Slain, MD,  [phone]) Care Team Provider: Collene Mac FERNS, NP,  [phone]) Care Team Provider: Ernesto Paulita SAILOR, MD,  [phone])  NEXT STEPS: [x]  Early Intervention: Schedule sooner appointment, call our on-call services, or go to emergency room if there is any significant Increase in pain or discomfort New or worsening symptoms Sudden or severe changes in your health [x]  Flexible Follow-Up: We recommend a No follow-ups on file. for optimal routine care. This allows for progress monitoring and treatment adjustments. [x]  Preventive Care: Schedule your annual preventive care visit! It's typically covered by insurance and helps identify potential health issues early. [x]  Lab & X-ray Appointments: Incomplete tests scheduled today, or call to schedule.  X-rays: [facility]  [place] (M-F, 8:30am-noon or 1pm-5pm). [x]  Medical Information Release: Sign a release form at front desk to obtain relevant medical information we don't have.  MAKING THE MOST OF OUR FOCUSED 20 MINUTE APPOINTMENTS: [x]   Clearly state your top concerns at the beginning of the visit to focus our discussion [x]   If you anticipate you will need more time, please inform the front desk during scheduling - we can book multiple appointments in the same week. [x]   If you have transportation problems- use our convenient video appointments or ask about transportation support. [x]   We can get down to business faster if you use MyChart to update information before the visit and submit non-urgent questions before your visit. Thank you for taking the time to provide details through MyChart.  Let our nurse know and she can import this information into your encounter documents.  Arrival and Wait Times: [x]   Arriving on time ensures that everyone receives prompt attention. [x]   Early morning (8a) and afternoon (1p) appointments tend to have shortest wait times. [x]   Unfortunately, we cannot delay appointments for late arrivals or hold slots during phone calls.  Getting Answers and Following Up [x]   Simple Questions & Concerns: For quick questions or basic follow-up after your visit, reach us  at [phone] or MyChart messaging. [x]   Complex Concerns: If your concern is more complex, scheduling an appointment might be best. Discuss this with the staff to find the most suitable option. [x]   Lab & Imaging Results: We'll contact you directly if results are abnormal or you don't use MyChart. Most normal results will be on MyChart within 2-3 business days, with a review message from Dr. Jesus. Haven't heard back in 2 weeks? Need results sooner? Contact us  at [phone]. [x]   Referrals: Our referral coordinator will manage specialist referrals. The specialist's office should contact  you within 2 weeks to schedule an appointment. Call us  if you haven't heard from them after 2 weeks.  Staying Connected [x]   MyChart: Activate your MyChart for the fastest way to access results and message us . See the last page of this paperwork for instructions on how to activate.  Bring to Your Next Appointment [x]   Medications: Please bring all your medication bottles to your next appointment to ensure we have an accurate record of your prescriptions. [x]   Health Diaries: If you're monitoring any health conditions at home, keeping a diary of your readings can be very helpful for discussions at your next appointment.  Billing [x]   X-ray & Lab Orders: These are billed by separate companies. Contact the invoicing company directly for questions or concerns. [x]   Visit Charges: Discuss any billing inquiries with our administrative services team.  Your Satisfaction Matters [x]   Share Your Experience: We strive for your satisfaction! If you have any complaints, or preferably compliments, please let Dr. Jesus know directly or contact our Practice Administrators, Manuelita Rubin or [employer], by asking at the front desk.   Reviewing Your Records [x]   Review this early draft of your clinical encounter notes below and the final encounter summary tomorrow on MyChart after its been completed.  All orders placed so far are visible here: PTSD (post-traumatic stress disorder) -     Ambulatory referral to Psychology -     Ambulatory referral to Sleep Studies  Sleep disturbances -     Ambulatory referral to Sleep Studies  Lumbago of lumbar region with sciatica -     DG Lumbar Spine Complete; Future -  Gabapentin ; Take 1 capsule (300 mg total) by mouth 3 (three) times daily.  Dispense: 270 capsule; Refill: 3 -     Ambulatory referral to Physical Therapy -     Cyclobenzaprine  HCl; Take 1 tablet (10 mg total) by mouth 2 (two) times daily.  Dispense: 60 tablet; Refill: 2 -     Sedimentation  rate  Aortic atherosclerosis -     Rosuvastatin  Calcium ; Take 1 tablet (10 mg total) by mouth daily for 30 days, THEN 2 tablets (20 mg total) daily.  Dispense: 180 tablet; Refill: 1  Pure hypercholesterolemia -     Lipid panel -     Rosuvastatin  Calcium ; Take 1 tablet (10 mg total) by mouth daily for 30 days, THEN 2 tablets (20 mg total) daily.  Dispense: 180 tablet; Refill: 1  Other fatigue -     Comprehensive metabolic panel with GFR -     CBC with Differential/Platelet -     TSH Rfx on Abnormal to Free T4 -     Cyclobenzaprine  HCl; Take 1 tablet (10 mg total) by mouth 2 (two) times daily.  Dispense: 60 tablet; Refill: 2 -     Ondansetron ; Take 1 tablet (4 mg total) by mouth every 8 (eight) hours as needed for nausea or vomiting.  Dispense: 15 tablet; Refill: 2  Nausea -     Ondansetron ; Take 1 tablet (4 mg total) by mouth every 8 (eight) hours as needed for nausea or vomiting.  Dispense: 15 tablet; Refill: 2  Primary hypertension -     Lisinopril -hydroCHLOROthiazide ; Take 1 tablet by mouth daily.  Dispense: 90 tablet; Refill: 1  PUD (peptic ulcer disease)

## 2024-06-30 NOTE — Progress Notes (Signed)
 ==============================  [facility]: [phone]   -- Medical Office Visit --  Patient: Amanda White      Age: 59 y.o.       Sex:  female  Date:   06/30/2024 Today's Healthcare Provider: Bernardino KANDICE Cone, MD  ==============================   Chief Complaint: Abdominal Pain (Nausea at times out of know where) and Back Pain (Back pain for while now getting worse )  Discussed the use of AI scribe software for clinical note transcription with the patient, who gave verbal consent to proceed.  History of Present Illness 59 year old female who presents with abdominal and back pain.  She experiences intermittent nausea that improves with water intake. She has a history of colitis, previously treated successfully in the ER, which resolved symptoms of abdominal pain and nausea. She takes omeprazole 40 mg twice daily for heartburn, which is effective, and uses sucralfate  as needed for ulcer pain. She avoids blood thinners due to previous advice against aspirin and ibuprofen  use.  She has chronic lower back pain, which has worsened, preventing her from working. She has not worked since the pain intensified. She takes gabapentin  and Flexeril , which help manage the pain without causing drowsiness.  She is on clonazepam  and venlafaxine  for anxiety and depression, which have been helpful. She experienced significant emotional trauma following the death of her grandson in a car accident, affecting her mental health and family dynamics. She wants therapy but has not found a suitable therapist yet.  She has a family history of Alzheimer's disease on both sides and expresses concern about her memory issues, attributing them to stress and sleep disturbances. No personal history of sleep apnea but reports snoring as 'normal'.  She is on rosuvastatin  for cholesterol management, which she tolerates well after switching from atorvastatin  due to muscle aches. She  has a history of hypertension, managed with lisinopril  hydrochlorothiazide , and reports good blood pressure control. No recent urinary tract infections.  Background Reviewed: Problem List: has Hyperlipidemia; Benzodiazepine dependence (HCC); GERD (gastroesophageal reflux disease); Lumbago of lumbar region with sciatica; Podagra; Hypertension; Generalized anxiety disorder; Memory impairment; Subcutaneous nodule; RUQ abdominal tenderness; Cystic disease of liver; Right-sided chest pain; Cyst of gallbladder; Aortic atherosclerosis; History of colon polyps; DDD (degenerative disc disease), thoracolumbar; Moderate episode of recurrent major depressive disorder (HCC); Long-term current use of benzodiazepine; PUD (peptic ulcer disease); Fecal retention; High gamma glutamyl transferase (GGT); Arthritis of first metatarsophalangeal (MTP) joint of right foot; Onychomycosis; Family history of rheumatoid arthritis; History of weight loss; Hallux limitus of right foot; Statin intolerance; Pericardial effusion; History of peptic ulcer; Overweight; Other fatigue; Other insomnia; PTSD (post-traumatic stress disorder); Sleep disturbances; and Nausea on their problem list. Past Medical History:  has a past medical history of Abnormal cardiac CT angiography (10/12/2022), Anxiety, Bipolar 1 disorder (HCC), Carpal tunnel syndrome (09/25/2022), DDD (degenerative disc disease), thoracolumbar (10/25/2022), Depression, Depression, Dizziness (10/09/2022), GERD (gastroesophageal reflux disease), History of weight loss (12/25/2022), Hypertension, LFT elevation (12/25/2022), Migraine, Pressure ulcer (11/28/2022), Subcutaneous nodule (09/25/2022), and Unintentional weight loss (09/25/2022). Past Surgical History:   has a past surgical history that includes Dilation and curettage of uterus; Carpal tunnel release (Right, 04/18/2021); and Foot surgery (02/27/2023). Social History:   reports that she has never smoked. She has never used  smokeless tobacco. She reports that she does not drink alcohol and does not use drugs. Family History:  family history includes Hypertension in her mother; Stroke in her father. Allergies:  has no known  allergies.   Medication Reconciliation: Current Outpatient Medications on File Prior to Visit  Medication Sig   acetaminophen  (TYLENOL ) 325 MG tablet Take 650 mg by mouth every 6 (six) hours as needed.   ciclopirox  (LOPROX ) 0.77 % cream Apply topically 2 (two) times daily.   clonazePAM  (KLONOPIN ) 2 MG tablet TAKE 1 TABLET (2 MG TOTAL) BY MOUTH AT BEDTIME AS NEEDED FOR ANXIETY.   diclofenac  Sodium (VOLTAREN ) 1 % GEL Apply 4 g topically 4 (four) times daily as needed.   famotidine -calcium  carbonate-magnesium  hydroxide (PEPCID  COMPLETE) 10-800-165 MG chewable tablet Chew 1 tablet by mouth daily as needed.   omeprazole (PRILOSEC) 40 MG capsule Take 40 mg by mouth every morning.   sucralfate  (CARAFATE ) 1 g tablet Take 1 tablet (1 g total) by mouth 4 (four) times daily -  with meals and at bedtime.   venlafaxine  XR (EFFEXOR -XR) 75 MG 24 hr capsule Take 1 capsule (75 mg total) by mouth daily with breakfast.   No current facility-administered medications on file prior to visit.   Medications Discontinued During This Encounter  Medication Reason   rosuvastatin  (CRESTOR ) 10 MG tablet Reorder   ondansetron  (ZOFRAN -ODT) 4 MG disintegrating tablet Reorder   gabapentin  (NEURONTIN ) 300 MG capsule Reorder   lisinopril -hydrochlorothiazide  (ZESTORETIC ) 10-12.5 MG tablet Reorder   cyclobenzaprine  (FLEXERIL ) 10 MG tablet Reorder     Physical Exam:    06/30/2024    9:22 AM 05/14/2024    3:54 PM 04/02/2024    8:42 PM  Vitals with BMI  Height 5' 3    Weight 148 lbs 11 oz    BMI 26.35    Systolic 130    Diastolic 70    Pulse 97       Information is confidential and restricted. Go to Review Flowsheets to unlock data.  Vital signs reviewed.  Nursing notes reviewed. Weight trend reviewed. Physical  Activity: Inactive (05/31/2023)   Exercise Vital Sign    Days of Exercise per Week: 0 days    Minutes of Exercise per Session: 0 min  General Appearance:  No acute distress appreciable.   Well-groomed, healthy-appearing female.  Well proportioned with no abnormal fat distribution.  Good muscle tone. Pulmonary:  Normal work of breathing at rest, no respiratory distress apparent. SpO2: 98 %  Musculoskeletal: All extremities are intact.  Neurological:  Awake, alert, oriented, and engaged.  No obvious focal neurological deficits or cognitive impairments.  Sensorium seems unclouded.   Speech is clear and coherent with logical content. Psychiatric:  Appropriate mood, pleasant and cooperative demeanor, thoughtful and engaged during the exam Results Radiology Lumbar spine x-ray (11/2022): Anterolisthesis of L5 on S1; reduced intervertebral disc height at multiple lumbar levels     05/14/2024    3:54 PM 04/02/2024    2:46 PM 02/27/2024    3:49 PM 01/14/2024    4:35 PM  PHQ 2/9 Scores  PHQ - 2 Score      PHQ- 9 Score         Information is confidential and restricted. Go to Review Flowsheets to unlock data.   Office Visit on 06/30/2024  Component Date Value Ref Range Status   Cholesterol 06/30/2024 179  28 - 200 mg/dL Final   Triglycerides 98/94/7973 124.0  10.0 - 149.0 mg/dL Final   HDL 98/94/7973 82.70  >39.00 mg/dL Final   VLDL 98/94/7973 24.8  0.0 - 40.0 mg/dL Final   LDL Cholesterol 06/30/2024 72  10 - 99 mg/dL Final   Total CHOL/HDL Ratio 06/30/2024 2  Final   NonHDL 06/30/2024 96.66   Final   Sodium 06/30/2024 137  135 - 145 mEq/L Final   Potassium 06/30/2024 3.9  3.5 - 5.1 mEq/L Final   Chloride 06/30/2024 97  96 - 112 mEq/L Final   CO2 06/30/2024 34 (H)  19 - 32 mEq/L Final   Glucose, Bld 06/30/2024 91  70 - 99 mg/dL Final   BUN 98/94/7973 20  6 - 23 mg/dL Final   Creatinine, Ser 06/30/2024 0.68  0.40 - 1.20 mg/dL Final   Total Bilirubin 06/30/2024 0.6  0.2 - 1.2 mg/dL Final    Alkaline Phosphatase 06/30/2024 44  39 - 117 U/L Final   AST 06/30/2024 25  5 - 37 U/L Final   ALT 06/30/2024 19  3 - 35 U/L Final   Total Protein 06/30/2024 7.3  6.0 - 8.3 g/dL Final   Albumin 98/94/7973 4.8  3.5 - 5.2 g/dL Final   GFR 98/94/7973 95.24  >60.00 mL/min Final   Calcium  06/30/2024 9.5  8.4 - 10.5 mg/dL Final   WBC 98/94/7973 6.3  4.0 - 10.5 K/uL Final   RBC 06/30/2024 3.74 (L)  3.87 - 5.11 Mil/uL Final   Hemoglobin 06/30/2024 12.2  12.0 - 15.0 g/dL Final   HCT 98/94/7973 35.2 (L)  36.0 - 46.0 % Final   MCV 06/30/2024 94.1  78.0 - 100.0 fl Final   MCHC 06/30/2024 34.8  30.0 - 36.0 g/dL Final   RDW 98/94/7973 13.3  11.5 - 15.5 % Final   Platelets 06/30/2024 373.0  150.0 - 400.0 K/uL Final   Neutrophils Relative % 06/30/2024 64.5  43.0 - 77.0 % Final   Lymphocytes Relative 06/30/2024 25.1  12.0 - 46.0 % Final   Monocytes Relative 06/30/2024 8.4  3.0 - 12.0 % Final   Eosinophils Relative 06/30/2024 1.1  0.0 - 5.0 % Final   Basophils Relative 06/30/2024 0.9  0.0 - 3.0 % Final   Neutro Abs 06/30/2024 4.0  1.4 - 7.7 K/uL Final   Lymphs Abs 06/30/2024 1.6  0.7 - 4.0 K/uL Final   Monocytes Absolute 06/30/2024 0.5  0.1 - 1.0 K/uL Final   Eosinophils Absolute 06/30/2024 0.1  0.0 - 0.7 K/uL Final   Basophils Absolute 06/30/2024 0.1  0.0 - 0.1 K/uL Final   Sed Rate 06/30/2024 3  0 - 30 mm/hr Final  Admission on 10/17/2023, Discharged on 10/17/2023  Component Date Value Ref Range Status   Lipase 10/17/2023 14  11 - 51 U/L Final   Sodium 10/17/2023 140  135 - 145 mmol/L Final   Potassium 10/17/2023 3.8  3.5 - 5.1 mmol/L Final   Chloride 10/17/2023 100  98 - 111 mmol/L Final   CO2 10/17/2023 28  22 - 32 mmol/L Final   Glucose, Bld 10/17/2023 105 (H)  70 - 99 mg/dL Final   BUN 95/76/7974 12  6 - 20 mg/dL Final   Creatinine, Ser 10/17/2023 0.66  0.44 - 1.00 mg/dL Final   Calcium  10/17/2023 10.1  8.9 - 10.3 mg/dL Final   Total Protein 95/76/7974 7.7  6.5 - 8.1 g/dL Final   Albumin  95/76/7974 5.2 (H)  3.5 - 5.0 g/dL Final   AST 95/76/7974 32  15 - 41 U/L Final   ALT 10/17/2023 35  0 - 44 U/L Final   Alkaline Phosphatase 10/17/2023 63  38 - 126 U/L Final   Total Bilirubin 10/17/2023 0.5  0.0 - 1.2 mg/dL Final   GFR, Estimated 10/17/2023 >60  >60 mL/min Final  Anion gap 10/17/2023 13  5 - 15 Final   WBC 10/17/2023 9.5  4.0 - 10.5 K/uL Final   RBC 10/17/2023 4.40  3.87 - 5.11 MIL/uL Final   Hemoglobin 10/17/2023 13.4  12.0 - 15.0 g/dL Final   HCT 95/76/7974 38.1  36.0 - 46.0 % Final   MCV 10/17/2023 86.6  80.0 - 100.0 fL Final   MCH 10/17/2023 30.5  26.0 - 34.0 pg Final   MCHC 10/17/2023 35.2  30.0 - 36.0 g/dL Final   RDW 95/76/7974 12.5  11.5 - 15.5 % Final   Platelets 10/17/2023 392  150 - 400 K/uL Final   nRBC 10/17/2023 0.0  0.0 - 0.2 % Final   Color, Urine 10/17/2023 YELLOW  YELLOW Final   APPearance 10/17/2023 CLEAR  CLEAR Final   Specific Gravity, Urine 10/17/2023 1.027  1.005 - 1.030 Final   pH 10/17/2023 6.5  5.0 - 8.0 Final   Glucose, UA 10/17/2023 NEGATIVE  NEGATIVE mg/dL Final   Hgb urine dipstick 10/17/2023 SMALL (A)  NEGATIVE Final   Bilirubin Urine 10/17/2023 NEGATIVE  NEGATIVE Final   Ketones, ur 10/17/2023 NEGATIVE  NEGATIVE mg/dL Final   Protein, ur 95/76/7974 30 (A)  NEGATIVE mg/dL Final   Nitrite 95/76/7974 NEGATIVE  NEGATIVE Final   Leukocytes,Ua 10/17/2023 NEGATIVE  NEGATIVE Final   RBC / HPF 10/17/2023 0-5  0 - 5 RBC/hpf Final   WBC, UA 10/17/2023 0-5  0 - 5 WBC/hpf Final   Bacteria, UA 10/17/2023 NONE SEEN  NONE SEEN Final   Squamous Epithelial / HPF 10/17/2023 0-5  0 - 5 /HPF Final   Mucus 10/17/2023 PRESENT   Final  Office Visit on 07/09/2023  Component Date Value Ref Range Status   SARS Coronavirus 2 Ag 07/09/2023 Negative  Negative Final  Office Visit on 07/05/2023  Component Date Value Ref Range Status   Rapid Strep A Screen 07/05/2023 Negative  Negative Final   SARS Coronavirus 2 Ag 07/05/2023 Positive (A)  Negative Final    Influenza A, POC 07/05/2023 Negative  Negative Final   Influenza B, POC 07/05/2023 Negative  Negative Final  Office Visit on 06/18/2023  Component Date Value Ref Range Status   WBC 06/18/2023 10.0  4.0 - 10.5 K/uL Final   RBC 06/18/2023 3.52 (L)  3.87 - 5.11 Mil/uL Final   Hemoglobin 06/18/2023 11.3 (L)  12.0 - 15.0 g/dL Final   HCT 87/76/7975 33.0 (L)  36.0 - 46.0 % Final   MCV 06/18/2023 93.9  78.0 - 100.0 fl Final   MCHC 06/18/2023 34.4  30.0 - 36.0 g/dL Final   RDW 87/76/7975 12.9  11.5 - 15.5 % Final   Platelets 06/18/2023 384.0  150.0 - 400.0 K/uL Final   Neutrophils Relative % 06/18/2023 75.8  43.0 - 77.0 % Final   Lymphocytes Relative 06/18/2023 15.1  12.0 - 46.0 % Final   Monocytes Relative 06/18/2023 7.6  3.0 - 12.0 % Final   Eosinophils Relative 06/18/2023 1.0  0.0 - 5.0 % Final   Basophils Relative 06/18/2023 0.5  0.0 - 3.0 % Final   Neutro Abs 06/18/2023 7.6  1.4 - 7.7 K/uL Final   Lymphs Abs 06/18/2023 1.5  0.7 - 4.0 K/uL Final   Monocytes Absolute 06/18/2023 0.8  0.1 - 1.0 K/uL Final   Eosinophils Absolute 06/18/2023 0.1  0.0 - 0.7 K/uL Final   Basophils Absolute 06/18/2023 0.1  0.0 - 0.1 K/uL Final   Sodium 06/18/2023 139  135 - 145 mEq/L Final   Potassium  06/18/2023 4.0  3.5 - 5.1 mEq/L Final   Chloride 06/18/2023 99  96 - 112 mEq/L Final   CO2 06/18/2023 34 (H)  19 - 32 mEq/L Final   Glucose, Bld 06/18/2023 179 (H)  70 - 99 mg/dL Final   BUN 87/76/7975 9  6 - 23 mg/dL Final   Creatinine, Ser 06/18/2023 0.63  0.40 - 1.20 mg/dL Final   Total Bilirubin 06/18/2023 0.3  0.2 - 1.2 mg/dL Final   Alkaline Phosphatase 06/18/2023 58  39 - 117 U/L Final   AST 06/18/2023 21  0 - 37 U/L Final   ALT 06/18/2023 20  0 - 35 U/L Final   Total Protein 06/18/2023 7.0  6.0 - 8.3 g/dL Final   Albumin 87/76/7975 4.2  3.5 - 5.2 g/dL Final   GFR 87/76/7975 97.72  >60.00 mL/min Final   Calcium  06/18/2023 9.5  8.4 - 10.5 mg/dL Final  Admission on 87/80/7975, Discharged on 06/15/2023   Component Date Value Ref Range Status   WBC 06/14/2023 18.4 (H)  4.0 - 10.5 K/uL Final   RBC 06/14/2023 3.49 (L)  3.87 - 5.11 MIL/uL Final   Hemoglobin 06/14/2023 11.5 (L)  12.0 - 15.0 g/dL Final   HCT 87/80/7975 31.4 (L)  36.0 - 46.0 % Final   MCV 06/14/2023 90.0  80.0 - 100.0 fL Final   MCH 06/14/2023 33.0  26.0 - 34.0 pg Final   MCHC 06/14/2023 36.6 (H)  30.0 - 36.0 g/dL Final   RDW 87/80/7975 12.3  11.5 - 15.5 % Final   Platelets 06/14/2023 324  150 - 400 K/uL Final   nRBC 06/14/2023 0.0  0.0 - 0.2 % Final   Neutrophils Relative % 06/14/2023 88  % Final   Neutro Abs 06/14/2023 16.3 (H)  1.7 - 7.7 K/uL Final   Lymphocytes Relative 06/14/2023 6  % Final   Lymphs Abs 06/14/2023 1.0  0.7 - 4.0 K/uL Final   Monocytes Relative 06/14/2023 5  % Final   Monocytes Absolute 06/14/2023 0.9  0.1 - 1.0 K/uL Final   Eosinophils Relative 06/14/2023 0  % Final   Eosinophils Absolute 06/14/2023 0.0  0.0 - 0.5 K/uL Final   Basophils Relative 06/14/2023 0  % Final   Basophils Absolute 06/14/2023 0.0  0.0 - 0.1 K/uL Final   Immature Granulocytes 06/14/2023 1  % Final   Abs Immature Granulocytes 06/14/2023 0.10 (H)  0.00 - 0.07 K/uL Final   Sodium 06/14/2023 135  135 - 145 mmol/L Final   Potassium 06/14/2023 3.4 (L)  3.5 - 5.1 mmol/L Final   Chloride 06/14/2023 97 (L)  98 - 111 mmol/L Final   CO2 06/14/2023 29  22 - 32 mmol/L Final   Glucose, Bld 06/14/2023 126 (H)  70 - 99 mg/dL Final   BUN 87/80/7975 23 (H)  6 - 20 mg/dL Final   Creatinine, Ser 06/14/2023 0.66  0.44 - 1.00 mg/dL Final   Calcium  06/14/2023 9.3  8.9 - 10.3 mg/dL Final   Total Protein 87/80/7975 7.2  6.5 - 8.1 g/dL Final   Albumin 87/80/7975 4.5  3.5 - 5.0 g/dL Final   AST 87/80/7975 23  15 - 41 U/L Final   ALT 06/14/2023 24  0 - 44 U/L Final   Alkaline Phosphatase 06/14/2023 53  38 - 126 U/L Final   Total Bilirubin 06/14/2023 0.7  <1.2 mg/dL Final   GFR, Estimated 06/14/2023 >60  >60 mL/min Final   Anion gap 06/14/2023 9  5 -  15 Final  Lipase 06/14/2023 24  11 - 51 U/L Final   Prothrombin Time 06/14/2023 12.9  11.4 - 15.2 seconds Final   INR 06/14/2023 1.0  0.8 - 1.2 Final   Fecal Occult Bld 06/15/2023 NEGATIVE  NEGATIVE Final   Hemoglobin 06/15/2023 11.4 (L)  12.0 - 15.0 g/dL Final   HCT 87/79/7975 31.5 (L)  36.0 - 46.0 % Final  Office Visit on 05/02/2023  Component Date Value Ref Range Status   WBC 05/02/2023 6.0  4.0 - 10.5 K/uL Final   RBC 05/02/2023 3.47 (L)  3.87 - 5.11 Mil/uL Final   Hemoglobin 05/02/2023 10.9 (L)  12.0 - 15.0 g/dL Final   HCT 88/93/7975 32.7 (L)  36.0 - 46.0 % Final   MCV 05/02/2023 94.4  78.0 - 100.0 fl Final   MCHC 05/02/2023 33.4  30.0 - 36.0 g/dL Final   RDW 88/93/7975 13.6  11.5 - 15.5 % Final   Platelets 05/02/2023 370.0  150.0 - 400.0 K/uL Final   Neutrophils Relative % 05/02/2023 57.7  43.0 - 77.0 % Final   Lymphocytes Relative 05/02/2023 29.8  12.0 - 46.0 % Final   Monocytes Relative 05/02/2023 9.6  3.0 - 12.0 % Final   Eosinophils Relative 05/02/2023 1.9  0.0 - 5.0 % Final   Basophils Relative 05/02/2023 1.0  0.0 - 3.0 % Final   Neutro Abs 05/02/2023 3.5  1.4 - 7.7 K/uL Final   Lymphs Abs 05/02/2023 1.8  0.7 - 4.0 K/uL Final   Monocytes Absolute 05/02/2023 0.6  0.1 - 1.0 K/uL Final   Eosinophils Absolute 05/02/2023 0.1  0.0 - 0.7 K/uL Final   Basophils Absolute 05/02/2023 0.1  0.0 - 0.1 K/uL Final   Sodium 05/02/2023 136  135 - 145 mEq/L Final   Potassium 05/02/2023 4.6  3.5 - 5.1 mEq/L Final   Chloride 05/02/2023 98  96 - 112 mEq/L Final   CO2 05/02/2023 33 (H)  19 - 32 mEq/L Final   Glucose, Bld 05/02/2023 81  70 - 99 mg/dL Final   BUN 88/93/7975 18  6 - 23 mg/dL Final   Creatinine, Ser 05/02/2023 0.64  0.40 - 1.20 mg/dL Final   Total Bilirubin 05/02/2023 0.5  0.2 - 1.2 mg/dL Final   Alkaline Phosphatase 05/02/2023 49  39 - 117 U/L Final   AST 05/02/2023 19  0 - 37 U/L Final   ALT 05/02/2023 16  0 - 35 U/L Final   Total Protein 05/02/2023 7.0  6.0 - 8.3 g/dL  Final   Albumin 88/93/7975 4.6  3.5 - 5.2 g/dL Final   GFR 88/93/7975 97.44  >60.00 mL/min Final   Calcium  05/02/2023 9.7  8.4 - 10.5 mg/dL Final   Cholesterol 88/93/7975 179  0 - 200 mg/dL Final   Triglycerides 88/93/7975 93.0  0.0 - 149.0 mg/dL Final   HDL 88/93/7975 74.30  >39.00 mg/dL Final   VLDL 88/93/7975 18.6  0.0 - 40.0 mg/dL Final   LDL Cholesterol 05/02/2023 86  0 - 99 mg/dL Final   Total CHOL/HDL Ratio 05/02/2023 2   Final   NonHDL 05/02/2023 104.33   Final   GGT 05/02/2023 38  7 - 51 U/L Final  Appointment on 02/14/2023  Component Date Value Ref Range Status   S' Lateral 02/14/2023 2.93  cm Final   Area-P 1/2 02/14/2023 3.85  cm2 Final   MV M vel 02/14/2023 4.59  m/s Final   MV Peak grad 02/14/2023 84.3  mmHg Final   Est EF 02/14/2023 55 - 60%  Final  Office Visit on 12/25/2022  Component Date Value Ref Range Status   Uric Acid, Serum 12/25/2022 3.8  2.4 - 7.0 mg/dL Final   Sed Rate 92/98/7975 11  0 - 30 mm/hr Final   CRP 12/25/2022 <1.0  0.5 - 20.0 mg/dL Final   Sodium 92/98/7975 140  135 - 145 mEq/L Final   Potassium 12/25/2022 4.6  3.5 - 5.1 mEq/L Final   Chloride 12/25/2022 99  96 - 112 mEq/L Final   CO2 12/25/2022 34 (H)  19 - 32 mEq/L Final   Glucose, Bld 12/25/2022 94  70 - 99 mg/dL Final   BUN 92/98/7975 14  6 - 23 mg/dL Final   Creatinine, Ser 12/25/2022 0.65  0.40 - 1.20 mg/dL Final   Total Bilirubin 12/25/2022 0.4  0.2 - 1.2 mg/dL Final   Alkaline Phosphatase 12/25/2022 50  39 - 117 U/L Final   AST 12/25/2022 25  0 - 37 U/L Final   ALT 12/25/2022 28  0 - 35 U/L Final   Total Protein 12/25/2022 7.4  6.0 - 8.3 g/dL Final   Albumin 92/98/7975 4.8  3.5 - 5.2 g/dL Final   GFR 92/98/7975 97.31  >60.00 mL/min Final   Calcium  12/25/2022 10.7 (H)  8.4 - 10.5 mg/dL Final   AFP-Tumor Marker 12/25/2022 9.6 (H)  ng/mL Final   Cortisol, Plasma 12/25/2022 9.4  ug/dL Final   R793 ACTH  12/25/2022 14  6 - 50 pg/mL Final  Office Visit on 11/28/2022  Component Date  Value Ref Range Status   H pylori Ag, Stl 11/30/2022 Negative  Negative Final  There may be more visits with results that are not included.       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 Currently managed with clonazepam  and venlafaxine . Already doing psychiatry follow up. She expresses a need for counseling and is referred to a therapist. Referred to behavioral health for counseling services. Clonazepam  and venlafaxine  will continue as prescribed by Dr. Collene.  Encouraged continue close follow up and reassured that treatments for this are getting better.   Seems low risk for suicidal ideation but very distressed.  Nwoko's charts are locked - this chart lock was not broken, opting to focus on nonpsychiatric care today. Sleep disturbances Sleep disorder, rule out sleep apnea   Reports of memory impairment and sleep disturbances may relate to PTSD and possible sleep apnea. No family history of sleep apnea, but her husband reports regular snoring. Sleep apnea is a common cause of memory issues and can be treated if diagnosed. A sleep study is ordered to evaluate for sleep apnea. Lumbago of lumbar region with sciatica Lumbar disc degeneration with lumbago and sciatica   Chronic lumbar disc degeneration with L5-S1 slippage causes significant back pain and sciatica, worsening symptoms that impact daily activities and work. A previous x-ray in June 2024 showed L5 slippage compared to S1. Current management with gabapentin  and cyclobenzaprine  provides some relief. Physical therapy is recommended to assess the need for further imaging and potential surgical intervention. A lumbar spine x-ray is ordered to assess the current status of L5-S1 slippage. She is referred to physical therapy for back pain  management. Gabapentin  and cyclobenzaprine  will continue for pain management. MRI and surgical consultation will be considered if physical therapy is ineffective. Aortic atherosclerosis Pure hypercholesterolemia Aortic  atherosclerosis is present and managed with rosuvastatin , which is well-tolerated without muscle aches. Rosuvastatin  will continue for cholesterol management.  She was advised to avoid aspirin by gastrointestinal due to peptic ulcer disease and its still somewhat active with high stress.Hypercholesterolemia is managed with rosuvastatin , which is well-tolerated and effective. Rosuvastatin  will continue for cholesterol management. Other fatigue Shared decision-making done; patient understood rationale and agreed to labwork  Nausea Sent Zofran  to help with peptic ulcer disease related nausea Primary hypertension Hypertension is well-controlled with the current medication regimen. Blood pressure readings are satisfactory. Lisinopril -hydrochlorothiazide  will continue for blood pressure management. PUD (peptic ulcer disease) Managed with omeprazole and sucralfate  as needed. Advised against aspirin due to gastrointestinal history. Omeprazole 40 mg will continue twice daily. Sucralfate  will be used as needed for ulcer pain.  Routine health maintenance is discussed, including the need for blood work to assess overall health status. Blood work is ordered to assess thyroid  function and overall health status.  ORDER ASSOCIATIONS  #   DIAGNOSIS / CONDITION ICD-10 ENCOUNTER ORDER     ICD-10-CM   1. PTSD (post-traumatic stress disorder)  F43.10 Ambulatory referral to Psychology    Ambulatory referral to Sleep Studies    2. Sleep disturbances  G47.9 Ambulatory referral to Sleep Studies    3. Lumbago of lumbar region with sciatica  M54.40 DG Lumbar Spine Complete    gabapentin  (NEURONTIN ) 300 MG capsule    Ambulatory referral to Physical Therapy    cyclobenzaprine  (FLEXERIL ) 10 MG tablet     Sedimentation rate    4. Aortic atherosclerosis  I70.0 rosuvastatin  (CRESTOR ) 10 MG tablet    5. Pure hypercholesterolemia  E78.00 Lipid panel    rosuvastatin  (CRESTOR ) 10 MG tablet    6. Other fatigue  R53.83 Comprehensive metabolic panel with GFR    CBC with Differential/Platelet    TSH Rfx on Abnormal to Free T4    cyclobenzaprine  (FLEXERIL ) 10 MG tablet    ondansetron  (ZOFRAN -ODT) 4 MG disintegrating tablet    7. Nausea  R11.0 ondansetron  (ZOFRAN -ODT) 4 MG disintegrating tablet    8. Primary hypertension  I10 lisinopril -hydrochlorothiazide  (ZESTORETIC ) 10-12.5 MG tablet    9. PUD (peptic ulcer disease)  K27.9            Orders Placed in Encounter:   Lab Orders         Lipid panel         Comprehensive metabolic panel with GFR         CBC with Differential/Platelet         TSH Rfx on Abnormal to Free T4         Sedimentation rate     Imaging Orders         DG Lumbar Sp[facility] Complete     Referral Orders         Ambulatory referral to Psychology         Ambulatory referral to Sleep Studies         Ambulatory referral to Physical Therapy     Meds ordered this encounter  Medications   rosuvastatin  (CRESTOR ) 10 MG tablet    Sig: Take 1 tablet (10 mg total) by mouth daily for 30 days, THEN 2 tablets (20 mg total) daily.    Dispense:  180 tablet    Refill:  1   gabapentin  (NEURONTIN ) 300 MG capsule    Sig: Take 1 capsule (300 mg total) by mouth 3 (three) times daily.    Dispense:  270 capsule  Refill:  3   cyclobenzaprine  (FLEXERIL ) 10 MG tablet    Sig: Take 1 tablet (10 mg total) by mouth 2 (two) times daily.    Dispense:  60 tablet    Refill:  2    PT NEEDS REFILLS   ondansetron  (ZOFRAN -ODT) 4 MG disintegrating tablet    Sig: Take 1 tablet (4 mg total) by mouth every 8 (eight) hours as needed for nausea or vomiting.    Dispense:  15 tablet    Refill:  2   lisinopril -hydrochlorothiazide  (ZESTORETIC ) 10-12.5 MG tablet    Sig: Take 1 tablet by mouth daily.     Dispense:  90 tablet    Refill:  1    PT NEEDS REFILLS       This document was synthesized by artificial intelligence ([employer]) using HIPAA-compliant recording of the clinical interaction;   We discussed the use of AI scribe software for clinical note transcription with the patient, who gave verbal consent to proceed. additional Info: This encounter employed state-of-the-art, real-time, collaborative documentation. The patient actively reviewed and assisted in updating their electronic medical record on a shared screen, ensuring transparency and facilitating joint problem-solving for the problem list, overview, and plan. This approach promotes accurate, informed care. The treatment plan was discussed and reviewed in detail, including medication safety, potential side effects, and all patient questions. We confirmed understanding and comfort with the plan. Follow-up instructions were established, including contacting the office for any concerns, returning if symptoms worsen, persist, or new symptoms develop, and precautions for potential emergency department visits.

## 2024-06-30 NOTE — Assessment & Plan Note (Signed)
 Sleep disorder, rule out sleep apnea   Reports of memory impairment and sleep disturbances may relate to PTSD and possible sleep apnea. No family history of sleep apnea, but her husband reports regular snoring. Sleep apnea is a common cause of memory issues and can be treated if diagnosed. A sleep study is ordered to evaluate for sleep apnea.

## 2024-06-30 NOTE — Assessment & Plan Note (Signed)
 Sent Zofran  to help with peptic ulcer disease related nausea

## 2024-06-30 NOTE — Assessment & Plan Note (Signed)
 Aortic atherosclerosis is present and managed with rosuvastatin , which is well-tolerated without muscle aches. Rosuvastatin  will continue for cholesterol management.  She was advised to avoid aspirin by gastrointestinal due to peptic ulcer disease and its still somewhat active with high stress.Hypercholesterolemia is managed with rosuvastatin , which is well-tolerated and effective. Rosuvastatin  will continue for cholesterol management.

## 2024-06-30 NOTE — Assessment & Plan Note (Signed)
 Lumbar disc degeneration with lumbago and sciatica   Chronic lumbar disc degeneration with L5-S1 slippage causes significant back pain and sciatica, worsening symptoms that impact daily activities and work. A previous x-ray in June 2024 showed L5 slippage compared to S1. Current management with gabapentin  and cyclobenzaprine  provides some relief. Physical therapy is recommended to assess the need for further imaging and potential surgical intervention. A lumbar spine x-ray is ordered to assess the current status of L5-S1 slippage. She is referred to physical therapy for back pain management. Gabapentin  and cyclobenzaprine  will continue for pain management. MRI and surgical consultation will be considered if physical therapy is ineffective.

## 2024-06-30 NOTE — Assessment & Plan Note (Signed)
 Hypertension is well-controlled with the current medication regimen. Blood pressure readings are satisfactory. Lisinopril -hydrochlorothiazide  will continue for blood pressure management.

## 2024-06-30 NOTE — Assessment & Plan Note (Signed)
 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 Currently managed with clonazepam  and venlafaxine . Already doing psychiatry follow up. She expresses a need for counseling and is referred to a therapist. Referred to behavioral health for counseling services. Clonazepam  and venlafaxine  will continue as prescribed by Dr. Collene.  Encouraged continue close follow up and reassured that treatments for this are getting better.   Seems low risk for suicidal ideation but very distressed.  Nwoko's charts are locked - this chart lock was not broken, opting to focus on nonpsychiatric care today.

## 2024-06-30 NOTE — Assessment & Plan Note (Signed)
 Shared decision-making done; patient understood rationale and agreed to [facility]

## 2024-07-01 LAB — TSH RFX ON ABNORMAL TO FREE T4: TSH: 0.628 u[IU]/mL (ref 0.450–4.500)

## 2024-07-02 ENCOUNTER — Ambulatory Visit (INDEPENDENT_AMBULATORY_CARE_PROVIDER_SITE_OTHER): Admitting: Physician Assistant

## 2024-07-02 VITALS — BP 147/72 | HR 91 | Temp 99.1°F | Ht 63.0 in | Wt 142.8 lb

## 2024-07-02 DIAGNOSIS — F331 Major depressive disorder, recurrent, moderate: Secondary | ICD-10-CM

## 2024-07-02 DIAGNOSIS — Z79899 Other long term (current) drug therapy: Secondary | ICD-10-CM

## 2024-07-02 DIAGNOSIS — F411 Generalized anxiety disorder: Secondary | ICD-10-CM

## 2024-07-02 MED ORDER — CLONAZEPAM 2 MG PO TABS: 2.0000 mg | ORAL_TABLET | Freq: Every evening | ORAL | 0 refills | Status: AC | PRN

## 2024-07-02 MED ORDER — VENLAFAXINE HCL ER 75 MG PO CP24: 75.0000 mg | ORAL_CAPSULE | Freq: Every day | ORAL | 0 refills | Status: AC

## 2024-07-09 NOTE — Telephone Encounter (Signed)
 read by [name] at 7:52AM on 07/09/2024.

## 2024-07-25 ENCOUNTER — Encounter (HOSPITAL_COMMUNITY): Payer: Self-pay | Admitting: Physician Assistant

## 2024-08-11 ENCOUNTER — Ambulatory Visit: Admitting: Internal Medicine

## 2024-08-13 ENCOUNTER — Encounter (HOSPITAL_COMMUNITY): Admitting: Physician Assistant
